# Patient Record
Sex: Female | Born: 1965 | Race: White | Hispanic: No | Smoking: Never smoker
Health system: Southern US, Community
[De-identification: ages and names within clinical notes are randomized; demographics above are authoritative.]

## PROBLEM LIST (undated history)

## (undated) DIAGNOSIS — K219 Gastro-esophageal reflux disease without esophagitis: Secondary | ICD-10-CM

## (undated) DIAGNOSIS — Z8719 Personal history of other diseases of the digestive system: Secondary | ICD-10-CM

## (undated) DIAGNOSIS — J45909 Unspecified asthma, uncomplicated: Secondary | ICD-10-CM

## (undated) DIAGNOSIS — Z972 Presence of dental prosthetic device (complete) (partial): Secondary | ICD-10-CM

## (undated) DIAGNOSIS — F419 Anxiety disorder, unspecified: Secondary | ICD-10-CM

## (undated) DIAGNOSIS — Z973 Presence of spectacles and contact lenses: Secondary | ICD-10-CM

## (undated) DIAGNOSIS — M1990 Unspecified osteoarthritis, unspecified site: Secondary | ICD-10-CM

## (undated) DIAGNOSIS — F411 Generalized anxiety disorder: Secondary | ICD-10-CM

## (undated) DIAGNOSIS — I10 Essential (primary) hypertension: Secondary | ICD-10-CM

## (undated) DIAGNOSIS — J189 Pneumonia, unspecified organism: Secondary | ICD-10-CM

## (undated) DIAGNOSIS — T7840XA Allergy, unspecified, initial encounter: Secondary | ICD-10-CM

## (undated) DIAGNOSIS — K449 Diaphragmatic hernia without obstruction or gangrene: Secondary | ICD-10-CM

## (undated) DIAGNOSIS — F32A Depression, unspecified: Secondary | ICD-10-CM

## (undated) HISTORY — DX: Gastro-esophageal reflux disease without esophagitis: K21.9

## (undated) HISTORY — PX: KIDNEY SURGERY: SHX687

## (undated) HISTORY — PX: FRACTURE SURGERY: SHX138

## (undated) HISTORY — DX: Unspecified asthma, uncomplicated: J45.909

## (undated) HISTORY — PX: ABDOMINAL HYSTERECTOMY: SHX81

## (undated) HISTORY — DX: Depression, unspecified: F32.A

## (undated) HISTORY — DX: Allergy, unspecified, initial encounter: T78.40XA

## (undated) HISTORY — DX: Anxiety disorder, unspecified: F41.9

## (undated) HISTORY — PX: TUBAL LIGATION: SHX77

## (undated) HISTORY — DX: Essential (primary) hypertension: I10

---

## 1996-10-03 HISTORY — PX: LAPAROSCOPIC TUBAL LIGATION: SUR803

## 2003-10-04 DIAGNOSIS — Z87448 Personal history of other diseases of urinary system: Secondary | ICD-10-CM

## 2003-10-04 HISTORY — DX: Personal history of other diseases of urinary system: Z87.448

## 2003-10-04 HISTORY — PX: CYSTOSCOPY WITH RETROGRADE PYELOGRAM, URETEROSCOPY AND STENT PLACEMENT: SHX5789

## 2004-07-15 ENCOUNTER — Ambulatory Visit (HOSPITAL_BASED_OUTPATIENT_CLINIC_OR_DEPARTMENT_OTHER): Admission: RE | Admit: 2004-07-15 | Discharge: 2004-07-15 | Payer: Self-pay | Admitting: Surgery

## 2004-07-26 ENCOUNTER — Ambulatory Visit (HOSPITAL_COMMUNITY): Admission: RE | Admit: 2004-07-26 | Discharge: 2004-07-26 | Payer: Self-pay | Admitting: Urology

## 2004-08-20 ENCOUNTER — Inpatient Hospital Stay (HOSPITAL_COMMUNITY): Admission: RE | Admit: 2004-08-20 | Discharge: 2004-08-23 | Payer: Self-pay | Admitting: Urology

## 2006-10-03 DIAGNOSIS — N189 Chronic kidney disease, unspecified: Secondary | ICD-10-CM

## 2006-10-03 HISTORY — DX: Chronic kidney disease, unspecified: N18.9

## 2008-10-03 HISTORY — PX: ORIF ANKLE FRACTURE: SUR919

## 2019-09-13 ENCOUNTER — Other Ambulatory Visit: Payer: Self-pay

## 2019-09-13 DIAGNOSIS — Z20822 Contact with and (suspected) exposure to covid-19: Secondary | ICD-10-CM

## 2019-09-14 LAB — NOVEL CORONAVIRUS, NAA: SARS-CoV-2, NAA: NOT DETECTED

## 2020-04-21 ENCOUNTER — Encounter: Payer: Self-pay | Admitting: Family Medicine

## 2020-04-21 ENCOUNTER — Other Ambulatory Visit: Payer: Self-pay

## 2020-04-21 ENCOUNTER — Ambulatory Visit (INDEPENDENT_AMBULATORY_CARE_PROVIDER_SITE_OTHER): Payer: Medicare Other | Admitting: Family Medicine

## 2020-04-21 ENCOUNTER — Other Ambulatory Visit (HOSPITAL_COMMUNITY)
Admission: RE | Admit: 2020-04-21 | Discharge: 2020-04-21 | Disposition: A | Discharge status: Home / Self Care | Payer: Medicare Other | Source: Ambulatory Visit | Attending: Family Medicine | Admitting: Family Medicine

## 2020-04-21 VITALS — BP 142/86 | HR 71 | Temp 97.8°F | Resp 18 | Ht 60.0 in | Wt 143.1 lb

## 2020-04-21 DIAGNOSIS — R300 Dysuria: Secondary | ICD-10-CM | POA: Insufficient documentation

## 2020-04-21 DIAGNOSIS — J4530 Mild persistent asthma, uncomplicated: Secondary | ICD-10-CM | POA: Insufficient documentation

## 2020-04-21 DIAGNOSIS — I10 Essential (primary) hypertension: Secondary | ICD-10-CM | POA: Insufficient documentation

## 2020-04-21 DIAGNOSIS — Z1231 Encounter for screening mammogram for malignant neoplasm of breast: Secondary | ICD-10-CM

## 2020-04-21 DIAGNOSIS — Z124 Encounter for screening for malignant neoplasm of cervix: Secondary | ICD-10-CM | POA: Insufficient documentation

## 2020-04-21 LAB — POCT URINALYSIS DIP (CLINITEK)
Bilirubin, UA: NEGATIVE
Glucose, UA: NEGATIVE mg/dL
Leukocytes, UA: NEGATIVE
Nitrite, UA: NEGATIVE
POC PROTEIN,UA: NEGATIVE
Spec Grav, UA: 1.01 (ref 1.010–1.025)
Urobilinogen, UA: 0.2 E.U./dL
pH, UA: 6.5 (ref 5.0–8.0)

## 2020-04-21 MED ORDER — BUDESONIDE-FORMOTEROL FUMARATE 80-4.5 MCG/ACT IN AERO: 1.0000 | INHALATION_SPRAY | Freq: Every day | RESPIRATORY_TRACT | 3 refills | Status: DC

## 2020-04-21 NOTE — Assessment & Plan Note (Signed)
[facility]

## 2020-04-21 NOTE — Assessment & Plan Note (Signed)
Mammogram ordered

## 2020-04-21 NOTE — Assessment & Plan Note (Signed)
Will start Symbicort to see if that will help with mucus production that she is experiencing. Close follow-up pulmonary referral as needed.  Reviewed side effects, risks and benefits of medication.   Patient acknowledged agreement and understanding of the plan.

## 2020-04-21 NOTE — Patient Instructions (Addendum)
I appreciate the opportunity to provide you with care for your health and wellness. Today we discussed: established care   Follow up: 4 weeks by phone for asthma and ringing of ears check in   Fasting labs when she can Referrals today for GYN (will need GI for colonoscopy at next appt) Orders: Nice :)  Start taking Claritin or Zyrtec daily. Use inhaler as discussed (rinse mouth post use)  Avoid salt to help BP (we will recheck at next visit)  Get compression sleeve for ankle to help with exercise tolerance (ice and elevate post activity)  Please continue to practice social distancing to keep you, your family, and our community safe.  If you must go out, please wear a mask and practice good handwashing.  It was a pleasure to see you and I look forward to continuing to work together on your health and well-being. Please do not hesitate to call the office if you need care or have questions about your care.  Have a wonderful day and week. With Gratitude, Cherly Beach, DNP, AGNP-BC

## 2020-04-21 NOTE — Assessment & Plan Note (Signed)
Katherine [name] is encouraged to maintain a well balanced diet that is low in salt. No currently taking medication for this. Additionally, she is also reminded that exercise is beneficial for heart health and control of  Blood pressure. 30-60 minutes daily is recommended-walking was suggested.

## 2020-04-21 NOTE — Assessment & Plan Note (Signed)
Urine dip in office was negative except for trace blood and some ketones.  We will send out for culture and sensitivity.  If needed will treat appropriately.  At this time encourage AZO, cranberry juice and limiting juice, and adequate if not more hydration than normal. Patient acknowledged agreement and understanding of the plan.

## 2020-04-21 NOTE — Progress Notes (Signed)
Subjective:  Patient ID: Katherine [name], female    DOB: [birth year]  Age: 54 y.o. MRN: [id number]  CC:  Chief Complaint  Patient presents with   New Patient (Initial Visit)    Former [facility] pt hasnt seen a primary since 2018 due to loss of insurance    Urinary Tract Infection    burning and frequent urination since thursday has been trying to drink plenty of water but no better       HPI  HPI  Katherine [name] is a 54 year old female patient who presents today to establish care to [facility].  Lives in [place] was a former patient of Katherine [name] but lost her insurance.  Presents here today because her daughter is also in the [facility] system.  She has not had labs in some time.  She is not up-to-date on her mammogram, has not had a colonoscopy, has not had a Pap stop cycles in 2013.  Only complaint today outside of establishing care is that she continuously has ringing in her ears for last 4 to 5 months.  She reports that it is worse at night.  She reports that especially after turning off the TV or there is been sound and become very quiet is very bothersome.  She is not taking anything for allergies.  She reports that she cleans them regularly with Q-tips.  Has not had any trouble hearing.  History is asthma, uses her rescue inhaler a few times a week.  Coughs up a lot of phlegm in the morning.  Has not been on Singulair or a steroid inhaler.  Reports that she feels like it is gotten worse over time.  Was diagnosed at 54 years old with asthma.  Blood pressure elevated today.  She reports that all of her family members have high blood pressure in addition to some type of thyroid issue.  She denies having any chest issues, cough or shortness of breath outside of described above, leg swelling outside of the right ankle when she broke it she has had problems at this fracture.  Specially when she is up on it uses a brace at times but she feels like it makes it worse.  Does not have any palpitations,  headaches, vision changes or dizziness.   Today patient denies signs and symptoms of COVID 19 infection including fever, chills, cough, shortness of breath, and headache. Past Medical, Surgical, Social History, Allergies, and Medications have been Reviewed.   Past Medical History:  Diagnosis Date   Asthma    Phreesia 04/18/2020   Depression    Phreesia 04/18/2020    No outpatient medications have been marked as taking for the 04/21/20 encounter (Office Visit) with Perlie Mayo, NP.    ROS:  Review of Systems  Constitutional: Negative.   HENT: Negative.   Eyes: Negative.   Respiratory: Negative.   Cardiovascular: Negative.   Gastrointestinal: Negative.   Genitourinary: Positive for dysuria, frequency and urgency.  Musculoskeletal: Negative.   Skin: Negative.   Neurological: Negative.   Endo/Heme/Allergies: Negative.   Psychiatric/Behavioral: Negative.   All other systems reviewed and are negative.    Objective:   Today's Vitals: BP (!) 142/86 (BP Location: Right Arm, Patient Position: Sitting, Cuff Size: Normal)    Pulse 71    Temp 97.8 F (36.6 C) (Temporal)    Resp 18    Ht 5' (1.524 m)    Wt 143 lb 1.9 oz (64.9 kg)    SpO2 97%  BMI 27.95 kg/m  Vitals with BMI 04/21/2020  Height 5' 0"   Weight 143 lbs 2 oz  BMI 03.54  Systolic 656  Diastolic 86  Pulse 71     Physical Exam Vitals and nursing note reviewed.  Constitutional:      Appearance: Normal appearance. She is well-developed, well-groomed and overweight.  HENT:     Head: Normocephalic and atraumatic.     Right Ear: External ear normal.     Left Ear: External ear normal.     Mouth/Throat:     Comments: Mask in place  Eyes:     General:        Right eye: No discharge.        Left eye: No discharge.     Conjunctiva/sclera: Conjunctivae normal.  Cardiovascular:     Rate and Rhythm: Normal rate and regular rhythm.     Pulses: Normal pulses.     Heart sounds: Normal heart sounds.  Pulmonary:      Effort: Pulmonary effort is normal.     Breath sounds: Normal breath sounds.  Musculoskeletal:        General: Normal range of motion.     Cervical back: Normal range of motion and neck supple.  Skin:    General: Skin is warm.  Neurological:     General: No focal deficit present.     Mental Status: She is alert and oriented to person, place, and time.  Psychiatric:        Attention and Perception: Attention normal.        Mood and Affect: Mood normal.        Speech: Speech normal.        Behavior: Behavior normal. Behavior is cooperative.        Thought Content: Thought content normal.        Cognition and Memory: Cognition normal.        Judgment: Judgment normal.     Comments: Pleasant on conversation  Good eye contact      Assessment   1. Mild persistent asthma, unspecified whether complicated   2. Essential hypertension   3. Dysuria   4. Encounter for screening mammogram for malignant neoplasm of breast   5. Encounter for screening for malignant neoplasm of cervix     Tests ordered Orders Placed This Encounter  Procedures   Urine Culture   MM 3D SCREEN BREAST BILATERAL   TSH   CBC   COMPLETE METABOLIC PANEL WITH GFR   Lipid panel   CMP14+EGFR   Ambulatory referral to Obstetrics / Gynecology   POCT URINALYSIS DIP (CLINITEK)     Plan: Please see assessment and plan per problem list above.   Meds ordered this encounter  Medications   budesonide-formoterol (SYMBICORT) 80-4.5 MCG/ACT inhaler    Sig: Inhale 1 puff into the lungs daily.    Dispense:  1 Inhaler    Refill:  3    Order Specific Question:   Supervising Provider    Answer:   Fayrene Helper [[id number]]    Patient to follow-up in 3 months   Perlie Mayo, NP

## 2020-04-22 LAB — URINE CULTURE: Culture: <10000 — AB

## 2020-05-08 ENCOUNTER — Inpatient Hospital Stay: Admission: RE | Admit: 2020-05-08 | Payer: Medicare Other | Source: Ambulatory Visit

## 2020-05-19 ENCOUNTER — Telehealth: Payer: Medicare Other | Admitting: Family Medicine

## 2020-05-19 ENCOUNTER — Other Ambulatory Visit: Payer: Self-pay

## 2020-10-01 ENCOUNTER — Other Ambulatory Visit: Payer: Self-pay

## 2020-10-01 ENCOUNTER — Ambulatory Visit
Admission: EM | Admit: 2020-10-01 | Discharge: 2020-10-01 | Disposition: A | Discharge status: Home / Self Care | Payer: Medicare Other | Attending: Family Medicine | Admitting: Family Medicine

## 2020-10-01 ENCOUNTER — Ambulatory Visit (INDEPENDENT_AMBULATORY_CARE_PROVIDER_SITE_OTHER): Payer: Medicare Other

## 2020-10-01 DIAGNOSIS — R059 Cough, unspecified: Secondary | ICD-10-CM

## 2020-10-01 DIAGNOSIS — J189 Pneumonia, unspecified organism: Secondary | ICD-10-CM | POA: Diagnosis not present

## 2020-10-01 DIAGNOSIS — R0602 Shortness of breath: Secondary | ICD-10-CM

## 2020-10-01 DIAGNOSIS — R5383 Other fatigue: Secondary | ICD-10-CM | POA: Diagnosis not present

## 2020-10-01 DIAGNOSIS — R519 Headache, unspecified: Secondary | ICD-10-CM | POA: Diagnosis not present

## 2020-10-01 DIAGNOSIS — R0981 Nasal congestion: Secondary | ICD-10-CM

## 2020-10-01 DIAGNOSIS — R52 Pain, unspecified: Secondary | ICD-10-CM | POA: Diagnosis not present

## 2020-10-01 DIAGNOSIS — R630 Anorexia: Secondary | ICD-10-CM

## 2020-10-01 MED ORDER — PREDNISONE 10 MG (21) PO TBPK
ORAL_TABLET | Freq: Every day | ORAL | 0 refills | Status: AC
Start: 2020-10-01 — End: 2020-10-07

## 2020-10-01 MED ORDER — DOXYCYCLINE HYCLATE 100 MG PO CAPS: 100.0000 mg | ORAL_CAPSULE | Freq: Two times a day (BID) | ORAL | 0 refills | Status: DC

## 2020-10-01 MED ORDER — METHYLPREDNISOLONE SODIUM SUCC 125 MG IJ SOLR: 125.0000 mg | Freq: Once | INTRAMUSCULAR | Status: DC

## 2020-10-01 MED ORDER — AZITHROMYCIN 250 MG PO TABS: 250.0000 mg | ORAL_TABLET | Freq: Every day | ORAL | 0 refills | Status: DC

## 2020-10-01 MED ORDER — PROMETHAZINE-DM 6.25-15 MG/5ML PO SYRP: 5.0000 mL | ORAL_SOLUTION | Freq: Four times a day (QID) | ORAL | 0 refills | Status: DC | PRN

## 2020-10-01 MED ORDER — DOXYCYCLINE HYCLATE 100 MG PO CAPS: 100.0000 mg | ORAL_CAPSULE | Freq: Two times a day (BID) | ORAL | 0 refills | Status: AC

## 2020-10-01 NOTE — Discharge Instructions (Addendum)
Your xray is positive for pneumonia to the right lower lung  I have sent in a prednisone taper for you to take for 6 days. 6 tablets on day one, 5 tablets on day two, 4 tablets on day three, 3 tablets on day four, 2 tablets on day five, and 1 tablet on day six.  I have sent in doxycycline for you to take twice a day for 10 days  I have sent in azithromycin for you to take. Take 2 tablets today, then one tablet daily for the next 4 days.  I have sent in cough syrup for you to take. This medication can make you sleepy. Do not drive while taking this medication.  You have received a steroid injection in the office today.  Your COVID and Flu tests are pending.  You should self quarantine until the test results are back.    Take Tylenol or ibuprofen as needed for fever or discomfort.  Rest and keep yourself hydrated.    Follow-up with your primary care provider if your symptoms are not improving.

## 2020-10-01 NOTE — ED Triage Notes (Signed)
x1 week. Fatigue, body aches, cough, headache, loss of appetite. Pt states that she is short of breath with some chest congestion. Pt states that she also has some ringing of the ears. Pt states that she is not vaccinated.

## 2020-10-03 LAB — NOVEL CORONAVIRUS, NAA: SARS-CoV-2, NAA: DETECTED — AB

## 2020-10-03 LAB — SARS-COV-2, NAA 2 DAY TAT

## 2020-10-04 NOTE — ED Provider Notes (Signed)
[facility]   [id number] 10/01/20 Arrival Time: [id number]   CC: COVID symptoms  SUBJECTIVE: History from: patient.  Katherine [name] is a 55 y.o. female who presents with abrupt onset of fatigue, body aches, cough, headache, loss of appetite, SOB, tinnitus for the last week. Denies sick exposure to COVID, flu or strep. Denies recent travel. Has negative history of Covid. Has not completed Covid vaccines. Has not taken OTC medications for this. SOB and cough are aggravated with activity. Denies previous symptoms in the past. Denies fever, sinus pain, rhinorrhea, sore throat, wheezing, chest pain, nausea, changes in bowel or bladder habits.    ROS: As per HPI.  All other pertinent ROS negative.     Past Medical History:  Diagnosis Date  . Asthma    Phreesia 04/18/2020  . Depression    Phreesia 04/18/2020   Past Surgical History:  Procedure Laterality Date  . CESAREAN SECTION N/A    Phreesia 04/18/2020  . FRACTURE SURGERY N/A    Phreesia 04/18/2020  . TUBAL LIGATION N/A    Phreesia 04/18/2020   No Known Allergies No current facility-administered medications on file prior to encounter.   Current Outpatient Medications on File Prior to Encounter  Medication Sig Dispense Refill  . albuterol (VENTOLIN HFA) 108 (90 Base) MCG/ACT inhaler Inhale into the lungs.    . budesonide-formoterol (SYMBICORT) 80-4.5 MCG/ACT inhaler Inhale 1 puff into the lungs daily. 1 Inhaler 3   Social History   Socioeconomic History  . Marital status: Divorced    Spouse name: Not on file  . Number of children: 4  . Years of education: Not on file  . Highest education level: 10th grade  Occupational History  . Not on file  Tobacco Use  . Smoking status: Never Smoker  . Smokeless tobacco: Never Used  Substance and Sexual Activity  . Alcohol use: Never  . Drug use: Never  . Sexual activity: Yes    Partners: Male    Birth control/protection: Surgical  Other Topics Concern  . Not on file   Social History Narrative   Lives alone   4 children-one in [place], [place], [place], and [place] in the [place] children 2      Enjoy: spending time with family, children visit often       Diets: eats all food groups    Caffeine: 1 cup coffee twice week   Water: 6-8 cups      Wears seat belt    Does not use phone while driving-handfree   Smoke detectors at home   No weapons in the house    Social Determinants of Health   Financial Resource Strain: [place]   . Difficulty of Paying Living Expenses: Not hard at all  Food Insecurity: No Food Insecurity  . Worried About Charity fundraiser in the Last Year: Never true  . Ran Out of Food in the Last Year: Never true  Transportation Needs: No Transportation Needs  . Lack of Transportation (Medical): No  . Lack of Transportation (Non-Medical): No  Physical Activity: Insufficiently Active  . Days of Exercise per Week: 3 days  . Minutes of Exercise per Session: 30 min  Stress: No Stress Concern Present  . Feeling of Stress : Only a little  Social Connections: Socially Isolated  . Frequency of Communication with Friends and Family: More than three times a week  . Frequency of Social Gatherings with Friends and Family: More than three times a week  .  Attends Religious Services: Never  . Active Member of Clubs or Organizations: No  . Attends Banker Meetings: Never  . Marital Status: Divorced  Catering manager Violence: Not At Risk  . Fear of Current or Ex-Partner: No  . Emotionally Abused: No  . Physically Abused: No  . Sexually Abused: No   Family History  Problem Relation Age of Onset  . Hypothyroidism Mother   . Hypertension Mother   . Hypertension Father   . Stroke Father   . Hypothyroidism Sister     OBJECTIVE:  Vitals:   10/01/20 1403 10/01/20 1404  BP:  133/85  Pulse:  75  Temp:  98.6 F (37 C)  TempSrc:  Oral  SpO2:  98%  Weight: 140 lb (63.5 kg)   Height: 5' (1.524 m)       General appearance: alert; appears fatigued, but nontoxic; speaking in full sentences and tolerating own secretions HEENT: NCAT; Ears: EACs clear, TMs pearly gray; Eyes: PERRL.  EOM grossly intact. Sinuses: nontender; Nose: nares patent without rhinorrhea, Throat: oropharynx erythematous, cobblestoning present, tonsils non erythematous or enlarged, uvula midline  Neck: supple with LAD Lungs: unlabored respirations, symmetrical air entry; cough: moderate; no respiratory distress; coarse lung sounds to R lung, wheezing to bilateral lung fields Heart: regular rate and rhythm.  Radial pulses 2+ symmetrical bilaterally Skin: warm and dry Psychological: alert and cooperative; normal mood and affect  LABS:  No results found for this or any previous visit (from the past 24 hour(s)).   ASSESSMENT & PLAN:  1. Pneumonia of right lower lobe due to infectious organism   2. Acute intractable headache, unspecified headache type   3. Body aches   4. Other fatigue   5. Nasal congestion   6. Cough   7. Loss of appetite     Meds ordered this encounter  Medications  . DISCONTD: doxycycline (VIBRAMYCIN) 100 MG capsule    Sig: Take 1 capsule (100 mg total) by mouth 2 (two) times daily.    Dispense:  14 capsule    Refill:  0    Order Specific Question:   Supervising Provider    Answer:   Merrilee Jansky [id number]  . azithromycin (ZITHROMAX) 250 MG tablet    Sig: Take 1 tablet (250 mg total) by mouth daily. Take first 2 tablets together, then 1 every day until finished.    Dispense:  6 tablet    Refill:  0    Order Specific Question:   Supervising Provider    Answer:   Merrilee Jansky [id number]  . promethazine-dextromethorphan (PROMETHAZINE-DM) 6.25-15 MG/5ML syrup    Sig: Take 5 mLs by mouth 4 (four) times daily as needed for cough.    Dispense:  118 mL    Refill:  0    Order Specific Question:   Supervising Provider    Answer:   Merrilee Jansky [id number]  . predniSONE (STERAPRED  UNI-PAK 21 TAB) 10 MG (21) TBPK tablet    Sig: Take by mouth daily for 6 days. Take 6 tablets on day 1, 5 tablets on day 2, 4 tablets on day 3, 3 tablets on day 4, 2 tablets on day 5, 1 tablet on day 6    Dispense:  21 tablet    Refill:  0    Order Specific Question:   Supervising Provider    Answer:   Merrilee Jansky [id number]  . doxycycline (VIBRAMYCIN) 100 MG capsule    Sig: Take 1 capsule (100  mg total) by mouth 2 (two) times daily for 10 days.    Dispense:  20 capsule    Refill:  0    Order Specific Question:   Supervising Provider    Answer:   Chase Picket [id number]  . DISCONTD: methylPREDNISolone sodium succinate (SOLU-MEDROL) 125 mg/2 mL injection 125 mg   Chest xray shows pneumonia in R lower lung Prescribed azithromycin Prescribed doxycycline Prescribed steroid taper Take as directed and to completion Prescribed promethazine cough syrup Sedation precautions given Recommend follow up chest xray in 4-6 weeks to ensure pneumonia resolution Continue supportive care at home COVID and flu testing ordered.  It will take between 1-2 days for test results.  Someone will contact you regarding abnormal results.   Work note provided Patient should remain in quarantine until they have received Covid results.  If negative you may resume normal activities (go back to work/school) while practicing hand hygiene, social distance, and mask wearing.  If positive, patient should remain in quarantine for 10 days from symptom onset AND greater than 72 hours after symptoms resolution (absence of fever without the use of fever-reducing medication and improvement in respiratory symptoms), whichever is longer Get plenty of rest and push fluids Use medications daily for symptom relief Use OTC medications like ibuprofen or tylenol as needed fever or pain Call or go to the ED if you have any new or worsening symptoms such as fever, worsening cough, shortness of breath, chest tightness, chest pain,  turning blue, changes in mental status.  Reviewed expectations re: course of current medical issues. Questions answered. Outlined signs and symptoms indicating need for more acute intervention. Patient verbalized understanding. After Visit Summary given.         Faustino Congress, NP 10/04/20 1104

## 2021-03-29 ENCOUNTER — Telehealth: Payer: Medicare Other | Admitting: Physician Assistant

## 2021-03-29 ENCOUNTER — Encounter: Payer: Self-pay | Admitting: Physician Assistant

## 2021-03-29 DIAGNOSIS — R3989 Other symptoms and signs involving the genitourinary system: Secondary | ICD-10-CM | POA: Diagnosis not present

## 2021-03-29 MED ORDER — CEPHALEXIN 500 MG PO CAPS: 500.0000 mg | ORAL_CAPSULE | Freq: Two times a day (BID) | ORAL | 0 refills | Status: DC

## 2021-03-29 NOTE — Patient Instructions (Signed)
Katherine [name], thank you for joining Mar Daring, PA-C for today's virtual visit.  While this provider is not your primary care provider (PCP), if your PCP is located in our provider database this encounter information will be shared with them immediately following your visit.  Consent: (Patient) Katherine [name] provided verbal consent for this virtual visit at the beginning of the encounter.  Current Medications:  Current Outpatient Medications:    cephALEXin (KEFLEX) 500 MG capsule, Take 1 capsule (500 mg total) by mouth 2 (two) times daily., Disp: 14 capsule, Rfl: 0   albuterol (VENTOLIN HFA) 108 (90 Base) MCG/ACT inhaler, Inhale into the lungs., Disp: , Rfl:    azithromycin (ZITHROMAX) 250 MG tablet, Take 1 tablet (250 mg total) by mouth daily. Take first 2 tablets together, then 1 every day until finished., Disp: 6 tablet, Rfl: 0   budesonide-formoterol (SYMBICORT) 80-4.5 MCG/ACT inhaler, Inhale 1 puff into the lungs daily., Disp: 1 Inhaler, Rfl: 3   promethazine-dextromethorphan (PROMETHAZINE-DM) 6.25-15 MG/5ML syrup, Take 5 mLs by mouth 4 (four) times daily as needed for cough., Disp: 118 mL, Rfl: 0   Medications ordered in this encounter:  Meds ordered this encounter  Medications   cephALEXin (KEFLEX) 500 MG capsule    Sig: Take 1 capsule (500 mg total) by mouth 2 (two) times daily.    Dispense:  14 capsule    Refill:  0    Order Specific Question:   Supervising Provider    Answer:   Sabra Heck, [place]     *If you need refills on other medications prior to your next appointment, please contact your pharmacy*  Follow-Up: Call back or seek an in-person evaluation if the symptoms worsen or if the condition fails to improve as anticipated.  If you have been instructed to have an in-person evaluation today at a local Urgent Care facility, please use the link below. It will take you to a list of all of our available [facility], including address, phone number and  hours of operation. Please do not delay care.  [facility]  If you or a family member do not have a primary care provider, use the link below to schedule a visit and establish care. When you choose a [facility] primary care physician or advanced practice provider, you gain a long-term partner in health. Find a Primary Care Provider  Learn more about [facility]'s in-office and virtual care options: [facility]   Urinary Tract Infection, Adult A urinary tract infection (UTI) is an infection of any part of the urinary tract. The urinary tract includes: The kidneys. The ureters. The bladder. The urethra. These organs make, store, and get rid of pee (urine) in the body. What are the causes? This infection is caused by germs (bacteria) in your genital area. These germs grow and cause swelling (inflammation) of your urinary tract. What increases the risk? The following factors may make you more likely to develop this condition: Using a small, thin tube (catheter) to drain pee. Not being able to control when you pee or poop (incontinence). Being female. If you are female, these things can increase the risk: Using these methods to prevent pregnancy: A medicine that kills sperm (spermicide). A device that blocks sperm (diaphragm). Having low levels of a female hormone (estrogen). Being pregnant. You are more likely to develop this condition if: You have genes that add to your risk. You are sexually active. You take antibiotic medicines. You  have trouble peeing because of: A prostate that is bigger than normal, if you are female. A blockage in the part of your body that drains pee from the bladder. A kidney stone. A nerve condition that affects your bladder. Not getting enough to drink. Not peeing often enough. You have other conditions, such as: Diabetes. A weak disease-fighting system (immune system). Sickle cell disease. Gout. Injury of the  spine. What are the signs or symptoms? Symptoms of this condition include: Needing to pee right away. Peeing small amounts often. Pain or burning when peeing. Blood in the pee. Pee that smells bad or not like normal. Trouble peeing. Pee that is cloudy. Fluid coming from the vagina, if you are female. Pain in the belly or lower back. Other symptoms include: Vomiting. Not feeling hungry. Feeling mixed up (confused). This may be the first symptom in older adults. Being tired and grouchy (irritable). A fever. Watery poop (diarrhea). How is this treated? Taking antibiotic medicine. Taking other medicines. Drinking enough water. In some cases, you may need to see a specialist. Follow these instructions at home:  Medicines Take over-the-counter and prescription medicines only as told by your doctor. If you were prescribed an antibiotic medicine, take it as told by your doctor. Do not stop taking it even if you start to feel better. General instructions Make sure you: Pee until your bladder is empty. Do not hold pee for a long time. Empty your bladder after sex. Wipe from front to back after peeing or pooping if you are a female. Use each tissue one time when you wipe. Drink enough fluid to keep your pee pale yellow. Keep all follow-up visits. Contact a doctor if: You do not get better after 1-2 days. Your symptoms go away and then come back. Get help right away if: You have very bad back pain. You have very bad pain in your lower belly. You have a fever. You have chills. You feeling like you will vomit or you vomit. Summary A urinary tract infection (UTI) is an infection of any part of the urinary tract. This condition is caused by germs in your genital area. There are many risk factors for a UTI. Treatment includes antibiotic medicines. Drink enough fluid to keep your pee pale yellow. This information is not intended to replace advice given to you by your health care  provider. Make sure you discuss any questions you have with your healthcare provider. Document Revised: 05/01/2020 Document Reviewed: 05/01/2020 Elsevier Patient Education  [employer].

## 2021-03-29 NOTE — Progress Notes (Signed)
Ms. Katherine, [name] are scheduled for a virtual visit with your provider today.    Just as we do with appointments in the office, we must obtain your consent to participate.  Your consent will be active for this visit and any virtual visit you may have with one of our providers in the next 365 days.    If you have a MyChart account, I can also send a copy of this consent to you electronically.  All virtual visits are billed to your insurance company just like a traditional visit in the office.  As this is a virtual visit, video technology does not allow for your provider to perform a traditional examination.  This may limit your provider's ability to fully assess your condition.  If your provider identifies any concerns that need to be evaluated in person or the need to arrange testing such as labs, EKG, etc, we will make arrangements to do so.    Although advances in technology are sophisticated, we cannot ensure that it will always work on either your end or our end.  If the connection with a video visit is poor, we may have to switch to a telephone visit.  With either a video or telephone visit, we are not always able to ensure that we have a secure connection.   I need to obtain your verbal consent now.   Are you willing to proceed with your visit today?   Katherine [name] has provided verbal consent on 03/29/2021 for a virtual visit (video or telephone).   Katherine [name], Katherine [name] 03/29/2021  9:48 AM  Virtual Visit Consent   Katherine [name], you are scheduled for a virtual visit with a [facility] provider today.     Just as with appointments in the office, your consent must be obtained to participate.  Your consent will be active for this visit and any virtual visit you may have with one of our providers in the next 365 days.     If you have a MyChart account, a copy of this consent can be sent to you electronically.  All virtual visits are billed to your insurance company just like a traditional visit in  the office.    As this is a virtual visit, video technology does not allow for your provider to perform a traditional examination.  This may limit your provider's ability to fully assess your condition.  If your provider identifies any concerns that need to be evaluated in person or the need to arrange testing (such as labs, EKG, etc.), we will make arrangements to do so.     Although advances in technology are sophisticated, we cannot ensure that it will always work on either your end or our end.  If the connection with a video visit is poor, the visit may have to be switched to a telephone visit.  With either a video or telephone visit, we are not always able to ensure that we have a secure connection.     I need to obtain your verbal consent now.   Are you willing to proceed with your visit today?    Katherine [name] has provided verbal consent on 03/29/2021 for a virtual visit (video or telephone).   Katherine [name], Katherine [name]   Date: 03/29/2021 9:48 AM   Virtual Visit via Video Note   [name], connected [email or web address] ([id number], 16-Oct-1965) on 03/29/21 at  9:45 AM EDT by a video-enabled telemedicine application and verified that I am speaking with the  correct person using two identifiers.  Location: Patient: Virtual Visit Location Patient: Home Provider: Virtual Visit Location Provider: Home Office   I discussed the limitations of evaluation and management by telemedicine and the availability of in person appointments. The patient expressed understanding and agreed to proceed.    History of Present Illness: Katherine [name] is a 55 y.o. who identifies as a female who was assigned female at birth, and is being seen today for UTI symptoms.  HPI: Urinary Tract Infection  This is a new problem. The current episode started in the past 7 days (symptoms started Thursday). The problem has been unchanged. The quality of the pain is described as aching. The pain is mild. There has been no  fever. She is Sexually active. There is No history of pyelonephritis. Associated symptoms include chills, flank pain (on left), frequency, nausea (saturday), sweats and urgency. Pertinent negatives include no discharge, hematuria, hesitancy, possible pregnancy or vomiting. She has tried increased fluids and acetaminophen (AZO) for the symptoms. The treatment provided no relief. Her past medical history is significant for recurrent UTIs.     Problems:  Patient Active Problem List   Diagnosis Date Noted   Essential hypertension 04/21/2020   Dysuria 04/21/2020   Mild persistent asthma 04/21/2020   Encounter for screening mammogram for malignant neoplasm of breast 04/21/2020   Encounter for screening for malignant neoplasm of cervix 04/21/2020    Allergies: No Known Allergies Medications:  Current Outpatient Medications:    cephALEXin (KEFLEX) 500 MG capsule, Take 1 capsule (500 mg total) by mouth 2 (two) times daily., Disp: 14 capsule, Rfl: 0   albuterol (VENTOLIN HFA) 108 (90 Base) MCG/ACT inhaler, Inhale into the lungs., Disp: , Rfl:    azithromycin (ZITHROMAX) 250 MG tablet, Take 1 tablet (250 mg total) by mouth daily. Take first 2 tablets together, then 1 every day until finished., Disp: 6 tablet, Rfl: 0   budesonide-formoterol (SYMBICORT) 80-4.5 MCG/ACT inhaler, Inhale 1 puff into the lungs daily., Disp: 1 Inhaler, Rfl: 3   promethazine-dextromethorphan (PROMETHAZINE-DM) 6.25-15 MG/5ML syrup, Take 5 mLs by mouth 4 (four) times daily as needed for cough., Disp: 118 mL, Rfl: 0  Observations/Objective: Patient is well-developed, well-nourished in no acute distress.  Resting comfortably at home.  Head is normocephalic, atraumatic.  No labored breathing.  Speech is clear and coherent with logical content.  Patient is alert and oriented at baseline.    Assessment and Plan: 1. Suspected UTI - cephALEXin (KEFLEX) 500 MG capsule; Take 1 capsule (500 mg total) by mouth 2 (two) times daily.   Dispense: 14 capsule; Refill: 0 -Worsening symptoms.  - Will treat empirically with Keflex.  - May continue AZO for spasm.  - Continue to push fluids.  - Seek in person evaluation if symptoms do not improve or if they worsen.   Follow Up Instructions: I discussed the assessment and treatment plan with the patient. The patient was provided an opportunity to ask questions and all were answered. The patient agreed with the plan and demonstrated an understanding of the instructions.  A copy of instructions were sent to the patient via MyChart.  The patient was advised to call back or seek an in-person evaluation if the symptoms worsen or if the condition fails to improve as anticipated.  Time:  I spent 11 minutes with the patient via telehealth technology discussing the above problems/concerns.    Katherine [name], Katherine [name]

## 2021-03-30 ENCOUNTER — Telehealth: Payer: Self-pay | Admitting: Family Medicine

## 2021-03-30 NOTE — Telephone Encounter (Signed)
Tried calling patient to schedule Medicare Annual Wellness Visit (AWV) either virtually or in office.  No answer  AWVI 01/01/21 per [place] please schedule at anytime with [facility]  health coach  This should be a 40 minute visit.

## 2021-04-21 ENCOUNTER — Ambulatory Visit (INDEPENDENT_AMBULATORY_CARE_PROVIDER_SITE_OTHER): Payer: Medicare Other | Admitting: *Deleted

## 2021-04-21 ENCOUNTER — Other Ambulatory Visit: Payer: Self-pay

## 2021-04-21 ENCOUNTER — Telehealth: Payer: Self-pay | Admitting: *Deleted

## 2021-04-21 DIAGNOSIS — Z0000 Encounter for general adult medical examination without abnormal findings: Secondary | ICD-10-CM

## 2021-04-21 NOTE — Progress Notes (Signed)
Subjective:   Katherine [name] is a 55 y.o. female who presents for Medicare Annual (Subsequent) preventive examination.  Review of Systems           Objective:    There were no vitals filed for this visit. There is no height or weight on file to calculate BMI.  No flowsheet data found.  Current Medications (verified) Outpatient Encounter Medications as of 04/21/2021  Medication Sig   albuterol (VENTOLIN HFA) 108 (90 Base) MCG/ACT inhaler Inhale into the lungs.   azithromycin (ZITHROMAX) 250 MG tablet Take 1 tablet (250 mg total) by mouth daily. Take first 2 tablets together, then 1 every day until finished.   budesonide-formoterol (SYMBICORT) 80-4.5 MCG/ACT inhaler Inhale 1 puff into the lungs daily.   cephALEXin (KEFLEX) 500 MG capsule Take 1 capsule (500 mg total) by mouth 2 (two) times daily.   promethazine-dextromethorphan (PROMETHAZINE-DM) 6.25-15 MG/5ML syrup Take 5 mLs by mouth 4 (four) times daily as needed for cough.   No facility-administered encounter medications on file as of 04/21/2021.    Allergies (verified) Patient has no known allergies.   History: Past Medical History:  Diagnosis Date   Asthma    Phreesia 04/18/2020   Depression    Phreesia 04/18/2020   Past Surgical History:  Procedure Laterality Date   CESAREAN SECTION N/A    Phreesia 04/18/2020   FRACTURE SURGERY N/A    Phreesia 04/18/2020   TUBAL LIGATION N/A    Phreesia 04/18/2020   Family History  Problem Relation Age of Onset   Hypothyroidism Mother    Hypertension Mother    Hypertension Father    Stroke Father    Hypothyroidism Sister    Social History   Socioeconomic History   Marital status: Divorced    Spouse name: Not on file   Number of children: 4   Years of education: Not on file   Highest education level: 10th grade  Occupational History   Not on file  Tobacco Use   Smoking status: Never   Smokeless tobacco: Never  Substance and Sexual Activity   Alcohol use: Never    Drug use: Never   Sexual activity: Yes    Partners: Male    Birth control/protection: Surgical  Other Topics Concern   Not on file  Social History Narrative   Lives alone   4 children-one in [place], [place], [place], and [place] in the [place] children 2      Enjoy: spending time with family, children visit often       Diets: eats all food groups    Caffeine: 1 cup coffee twice week   Water: 6-8 cups      Wears seat belt    Does not use phone while driving-handfree   Smoke detectors at home   No weapons in the house    Social Determinants of Health   Financial Resource Strain: Low Risk    Difficulty of Paying Living Expenses: Not hard at all  Food Insecurity: No Food Insecurity   Worried About Charity fundraiser in the Last Year: Never true   Arboriculturist in the Last Year: Never true  Transportation Needs: No Transportation Needs   Lack of Transportation (Medical): No   Lack of Transportation (Non-Medical): No  Physical Activity: Insufficiently Active   Days of Exercise per Week: 3 days   Minutes of Exercise per Session: 30 min  Stress: No Stress Concern Present   Feeling of Stress :  Only a little  Social Connections: Socially Isolated   Frequency of Communication with Friends and Family: More than three times a week   Frequency of Social Gatherings with Friends and Family: More than three times a week   Attends Religious Services: Never   Marine scientist or Organizations: No   Attends Archivist Meetings: Never   Marital Status: Divorced    Tobacco Counseling Counseling given: Not Answered   Clinical Intake:                 Diabetic?no         Activities of Daily Living In your present state of health, do you have any difficulty performing the following activities: 04/21/2020  Hearing? N  Vision? N  Difficulty concentrating or making decisions? N  Walking or climbing stairs? N  Dressing or bathing? N  Doing  errands, shopping? N  Some recent data might be hidden    Patient Care Team: Noreene Larsson, NP as PCP - General (Nurse Practitioner)  Indicate any recent Medical Services you may have received from other than [facility] providers in the past year (date may be approximate).     Assessment:   This is a routine wellness examination for Katherine [name].  Hearing/Vision screen No results found.  Dietary issues and exercise activities discussed:     Goals Addressed   None   Depression Screen PHQ 2/9 Scores 04/21/2020  PHQ - 2 Score 0  PHQ- 9 Score 0    Fall Risk Fall Risk  04/21/2020  Falls in the past year? 0  Number falls in past yr: 0  Injury with Fall? 0  Risk for fall due to : No Fall Risks  Follow up Falls evaluation completed    [place]:  Any stairs in or around the home? Yes  If so, are there any without handrails? Yes  Home free of loose throw rugs in walkways, pet beds, electrical cords, etc? Yes  Adequate lighting in your home to reduce risk of falls? Yes   ASSISTIVE DEVICES UTILIZED TO PREVENT FALLS:  Life alert? No  Use of a cane, walker or w/c? Yes  Grab bars in the bathroom? Yes  Shower chair or bench in shower? No  Elevated toilet seat or a handicapped toilet? No   TIMED UP AND GO:  Was the test performed? No .  Length of time to ambulate 10 feet: NA sec.     Cognitive Function:        Immunizations  There is no immunization history on file for this patient.  TDAP status: Up to date 2015  Flu Vaccine status: Up to date  Pneumococcal vaccine status: Up to date  Covid-19 vaccine status: Completed vaccines  Qualifies for Shingles Vaccine? Yes   Zostavax completed No   Shingrix Completed?: No.    Education has been provided regarding the importance of this vaccine. Patient has been advised to call insurance company to determine out of pocket expense if they have not yet received this vaccine. Advised may also  receive vaccine at local pharmacy or Health Dept. Verbalized acceptance and understanding.  Screening Tests Health Maintenance  Topic Date Due   HIV Screening  Never done   Hepatitis C Screening  Never done   TETANUS/TDAP  Never done   PAP SMEAR-Modifier  Never done   COLONOSCOPY (Pts 45-86yrs Insurance coverage will need to be confirmed)  Never done   MAMMOGRAM  Never  done   Zoster Vaccines- Shingrix (1 of 2) Never done   INFLUENZA VACCINE  05/03/2021   Pneumococcal Vaccine 22-57 Years old  Aged Out   HPV VACCINES  Aged Out    Health Maintenance  Health Maintenance Due  Topic Date Due   HIV Screening  Never done   Hepatitis C Screening  Never done   TETANUS/TDAP  Never done   PAP SMEAR-Modifier  Never done   COLONOSCOPY (Pts 45-28yrs Insurance coverage will need to be confirmed)  Never done   MAMMOGRAM  Never done   Zoster Vaccines- Shingrix (1 of 2) Never done    Colorectal cancer screening: Type of screening: Colonoscopy. Completed 2019. Repeat every 10 years  Mammogram status: Ordered Already placed pt will schedule . Pt provided with contact info and advised to call to schedule appt.     Lung Cancer Screening: (Low Dose CT Chest recommended if Age 33-80 years, 30 pack-year currently smoking OR have quit w/in 15years.) does not qualify.   Lung Cancer Screening Referral: NA  Additional Screening:  Hepatitis C Screening: does qualify;will have drawn next time in the office   Vision Screening: Recommended annual ophthalmology exams for early detection of glaucoma and other disorders of the eye. Is the patient up to date with their annual eye exam?  Yes  Who is the provider or what is the name of the office in which the patient attends annual eye exams? [facility] If pt is not established with a provider, would they like to be referred to a provider to establish care? No .   Dental Screening: Recommended annual dental exams for proper oral hygiene  Community Resource  Referral / Chronic Care Management: CRR required this visit?  No   CCM required this visit?  No      Plan:     I have personally reviewed and noted the following in the patient's chart:   Medical and social history Use of alcohol, tobacco or illicit drugs  Current medications and supplements including opioid prescriptions.  Functional ability and status Nutritional status Physical activity Advanced directives List of other physicians Hospitalizations, surgeries, and ER visits in previous 12 months Vitals Screenings to include cognitive, depression, and falls Referrals and appointments  In addition, I have reviewed and discussed with patient certain preventive protocols, quality metrics, and best practice recommendations. A written personalized care plan for preventive services as well as general preventive health recommendations were provided to patient.     Shelda Altes, CMA   04/21/2021   Nurse Notes:

## 2021-04-21 NOTE — Patient Instructions (Signed)
Katherine [name] , Thank you for taking time to come for your Medicare Wellness Visit. I appreciate your ongoing commitment to your health goals. Please review the following plan we discussed and let me know if I can assist you in the future.   Screening recommendations/referrals: Colonoscopy: Completed in 2019 Mammogram: Due now patient will schedule Recommended yearly ophthalmology/optometry visit for glaucoma screening and checkup Recommended yearly dental visit for hygiene and checkup  Vaccinations: Influenza vaccine: Due 05-03-21 Pneumococcal vaccine: Patient stated she completed Tdap vaccine: Patient stated completed Shingles vaccine: Due now    Advanced directives: Patient is going to discuss with daughters in the near future  Conditions/risks identified: Hypertension  Next appointment: 1 year   Preventive Care 40-64 Years, Female Preventive care refers to lifestyle choices and visits with your health care provider that can promote health and wellness. What does preventive care include? A yearly physical exam. This is also called an annual well check. Dental exams once or twice a year. Routine eye exams. Ask your health care provider how often you should have your eyes checked. Personal lifestyle choices, including: Daily care of your teeth and gums. Regular physical activity. Eating a healthy diet. Avoiding tobacco and drug use. Limiting alcohol use. Practicing safe sex. Taking low-dose aspirin daily starting at age 47. Taking vitamin and mineral supplements as recommended by your health care provider. What happens during an annual well check? The services and screenings done by your health care provider during your annual well check will depend on your age, overall health, lifestyle risk factors, and family history of disease. Counseling  Your health care provider may ask you questions about your: Alcohol use. Tobacco use. Drug use. Emotional well-being. Home and  relationship well-being. Sexual activity. Eating habits. Work and work Statistician. Method of birth control. Menstrual cycle. Pregnancy history. Screening  You may have the following tests or measurements: Height, weight, and BMI. Blood pressure. Lipid and cholesterol levels. These may be checked every 5 years, or more frequently if you are over 29 years old. Skin check. Lung cancer screening. You may have this screening every year starting at age 29 if you have a 30-pack-year history of smoking and currently smoke or have quit within the past 15 years. Fecal occult blood test (FOBT) of the stool. You may have this test every year starting at age 54. Flexible sigmoidoscopy or colonoscopy. You may have a sigmoidoscopy every 5 years or a colonoscopy every 10 years starting at age 41. Hepatitis C blood test. Hepatitis B blood test. Sexually transmitted disease (STD) testing. Diabetes screening. This is done by checking your blood sugar (glucose) after you have not eaten for a while (fasting). You may have this done every 1-3 years. Mammogram. This may be done every 1-2 years. Talk to your health care provider about when you should start having regular mammograms. This may depend on whether you have a family history of breast cancer. BRCA-related cancer screening. This may be done if you have a family history of breast, ovarian, tubal, or peritoneal cancers. Pelvic exam and Pap test. This may be done every 3 years starting at age 70. Starting at age 77, this may be done every 5 years if you have a Pap test in combination with an HPV test. Bone density scan. This is done to screen for osteoporosis. You may have this scan if you are at high risk for osteoporosis. Discuss your test results, treatment options, and if necessary, the need for more tests with your  health care provider. Vaccines  Your health care provider may recommend certain vaccines, such as: Influenza vaccine. This is recommended  every year. Tetanus, diphtheria, and acellular pertussis (Tdap, Td) vaccine. You may need a Td booster every 10 years. Zoster vaccine. You may need this after age 71. Pneumococcal 13-valent conjugate (PCV13) vaccine. You may need this if you have certain conditions and were not previously vaccinated. Pneumococcal polysaccharide (PPSV23) vaccine. You may need one or two doses if you smoke cigarettes or if you have certain conditions. Talk to your health care provider about which screenings and vaccines you need and how often you need them. This information is not intended to replace advice given to you by your health care provider. Make sure you discuss any questions you have with your health care provider. Document Released: 10/16/2015 Document Revised: 06/08/2016 Document Reviewed: 07/21/2015 Elsevier Interactive Patient Education  2017 [employer] Prevention in the Home Falls can cause injuries. They can happen to people of all ages. There are many things you can do to make your home safe and to help prevent falls. What can I do on the outside of my home? Regularly fix the edges of walkways and driveways and fix any cracks. Remove anything that might make you trip as you walk through a door, such as a raised step or threshold. Trim any bushes or trees on the path to your home. Use bright outdoor lighting. Clear any walking paths of anything that might make someone trip, such as rocks or tools. Regularly check to see if handrails are loose or broken. Make sure that both sides of any steps have handrails. Any raised decks and porches should have guardrails on the edges. Have any leaves, snow, or ice cleared regularly. Use sand or salt on walking paths during winter. Clean up any spills in your garage right away. This includes oil or grease spills. What can I do in the bathroom? Use night lights. Install grab bars by the toilet and in the tub and shower. Do not use towel bars as  grab bars. Use non-skid mats or decals in the tub or shower. If you need to sit down in the shower, use a plastic, non-slip stool. Keep the floor dry. Clean up any water that spills on the floor as soon as it happens. Remove soap buildup in the tub or shower regularly. Attach bath mats securely with double-sided non-slip rug tape. Do not have throw rugs and other things on the floor that can make you trip. What can I do in the bedroom? Use night lights. Make sure that you have a light by your bed that is easy to reach. Do not use any sheets or blankets that are too big for your bed. They should not hang down onto the floor. Have a firm chair that has side arms. You can use this for support while you get dressed. Do not have throw rugs and other things on the floor that can make you trip. What can I do in the kitchen? Clean up any spills right away. Avoid walking on wet floors. Keep items that you use a lot in easy-to-reach places. If you need to reach something above you, use a strong step stool that has a grab bar. Keep electrical cords out of the way. Do not use floor polish or wax that makes floors slippery. If you must use wax, use non-skid floor wax. Do not have throw rugs and other things on the floor that  can make you trip. What can I do with my stairs? Do not leave any items on the stairs. Make sure that there are handrails on both sides of the stairs and use them. Fix handrails that are broken or loose. Make sure that handrails are as long as the stairways. Check any carpeting to make sure that it is firmly attached to the stairs. Fix any carpet that is loose or worn. Avoid having throw rugs at the top or bottom of the stairs. If you do have throw rugs, attach them to the floor with carpet tape. Make sure that you have a light switch at the top of the stairs and the bottom of the stairs. If you do not have them, ask someone to add them for you. What else can I do to help prevent  falls? Wear shoes that: Do not have high heels. Have rubber bottoms. Are comfortable and fit you well. Are closed at the toe. Do not wear sandals. If you use a stepladder: Make sure that it is fully opened. Do not climb a closed stepladder. Make sure that both sides of the stepladder are locked into place. Ask someone to hold it for you, if possible. Clearly mark and make sure that you can see: Any grab bars or handrails. First and last steps. Where the edge of each step is. Use tools that help you move around (mobility aids) if they are needed. These include: Canes. Walkers. Scooters. Crutches. Turn on the lights when you go into a dark area. Replace any light bulbs as soon as they burn out. Set up your furniture so you have a clear path. Avoid moving your furniture around. If any of your floors are uneven, fix them. If there are any pets around you, be aware of where they are. Review your medicines with your doctor. Some medicines can make you feel dizzy. This can increase your chance of falling. Ask your doctor what other things that you can do to help prevent falls. This information is not intended to replace advice given to you by your health care provider. Make sure you discuss any questions you have with your health care provider. Document Released: 07/16/2009 Document Revised: 02/25/2016 Document Reviewed: 10/24/2014 Elsevier Interactive Patient Education  2017 [employer].

## 2021-04-21 NOTE — Telephone Encounter (Signed)
Pt wanted to let you know that the cephelexin that she was on did help for UTI however she is still having some burning and frequent urination after being off the medicine for a week.... Please advise

## 2021-04-22 NOTE — Telephone Encounter (Signed)
Lets repeat the UA and see if anything is growing.

## 2021-04-22 NOTE — Telephone Encounter (Signed)
Pt informed. Will bring UA by in the morning and will do a WORK IN virtual visit in the afternoon.

## 2021-04-23 ENCOUNTER — Other Ambulatory Visit: Payer: Self-pay

## 2021-04-23 ENCOUNTER — Ambulatory Visit: Payer: Medicare Other | Admitting: Family Medicine

## 2021-05-06 NOTE — Progress Notes (Signed)
I connected with  Katherine [name] on 05/06/21 by an audio enabled telemedicine application and verified that I am speaking with the correct person using two identifiers.   I discussed the limitations, risks, security and privacy concerns of performing an evaluation and management service by telephone and the availability of in person appointments. I also discussed with the patient that there may be a patient responsible charge related to this service. The patient expressed understanding and verbally consented to this telephonic visit.  The patient was at home. The provider was Porfirio Mylar NP who was in office. This was an audio telehealth visit.

## 2021-09-17 ENCOUNTER — Ambulatory Visit
Admission: EM | Admit: 2021-09-17 | Discharge: 2021-09-17 | Disposition: A | Discharge status: Home / Self Care | Payer: Medicare Other | Attending: Family Medicine | Admitting: Family Medicine

## 2021-09-17 ENCOUNTER — Encounter: Payer: Self-pay | Admitting: Emergency Medicine

## 2021-09-17 ENCOUNTER — Other Ambulatory Visit: Payer: Self-pay

## 2021-09-17 DIAGNOSIS — N390 Urinary tract infection, site not specified: Secondary | ICD-10-CM | POA: Diagnosis not present

## 2021-09-17 DIAGNOSIS — Z20822 Contact with and (suspected) exposure to covid-19: Secondary | ICD-10-CM | POA: Diagnosis not present

## 2021-09-17 DIAGNOSIS — J069 Acute upper respiratory infection, unspecified: Secondary | ICD-10-CM | POA: Diagnosis not present

## 2021-09-17 DIAGNOSIS — R059 Cough, unspecified: Secondary | ICD-10-CM

## 2021-09-17 LAB — POCT URINALYSIS DIP (MANUAL ENTRY)
Bilirubin, UA: NEGATIVE
Glucose, UA: NEGATIVE mg/dL
Ketones, POC UA: NEGATIVE mg/dL
Leukocytes, UA: NEGATIVE
Nitrite, UA: POSITIVE — AB
Protein Ur, POC: 30 mg/dL — AB
Spec Grav, UA: 1.025 (ref 1.010–1.025)
Urobilinogen, UA: 0.2 E.U./dL
pH, UA: 6 (ref 5.0–8.0)

## 2021-09-17 MED ORDER — CEPHALEXIN 500 MG PO CAPS: 500.0000 mg | ORAL_CAPSULE | Freq: Two times a day (BID) | ORAL | 0 refills | Status: DC

## 2021-09-17 MED ORDER — PROMETHAZINE-DM 6.25-15 MG/5ML PO SYRP: 5.0000 mL | ORAL_SOLUTION | Freq: Four times a day (QID) | ORAL | 0 refills | Status: DC | PRN

## 2021-09-17 NOTE — ED Triage Notes (Signed)
Chills since Wednesday.  Headache, nasal congestion, cough.  States she is having frequent urination and lower back since Tuesday.  Has been taking AZO.

## 2021-09-18 LAB — COVID-19, FLU A+B NAA
Influenza A, NAA: NOT DETECTED
Influenza B, NAA: NOT DETECTED
SARS-CoV-2, NAA: DETECTED — AB

## 2021-09-21 NOTE — ED Provider Notes (Signed)
[facility]    CSN: [id number] Arrival date & time: 09/17/21  1427      History   Chief Complaint No chief complaint on file.   HPI Katherine [name] is a 55 y.o. female.   Presenting today with several day history of chills, headache, congestion, cough, fatigue. Also for about the same duration having dysuria, urinary frequency, and low back aching. Denies N/V/D, abdominal pain, bowel changes, CP, SOB. Taking AZO for urinary sxs with temporary relief and OTC cold and congestion medications for URI sxs. Multiple sick contacts recently. No known hx of pulmonary dz or frequent UTIs.    Past Medical History:  Diagnosis Date   Asthma    [facility]reesia 04/18/2020   Depression    [facility]ia 04/18/2020    Patient Active Problem List   Diagnosis Date Noted   Essential hypertension 04/21/2020   Dysuria 04/21/2020   Mild persistent asthma 04/21/2020   Encounter for screening mammogram for malignant neoplasm of breast 04/21/2020   Encounter for screening for malignant neoplasm of cervix 04/21/2020    Past Surgical History:  Procedure Laterality Date   CESAREAN SECTION N/A    [facility]reesia 04/18/2020   FRACTURE SURGERY N/A    [facility]ia 04/18/2020   TUBAL LIGATION N/A    [facility]reesia 04/18/2020    OB History   No obstetric history on file.      Home Medications    Prior to Admission medications   Medication Sig Start Date End Date Taking? Authorizing Provider  cephALEXin (KEFLEX) 500 MG capsule Take 1 capsule (500 mg total) by mouth 2 (two) times daily. 09/17/21  Yes Volney American, PA-C  albuterol (VENTOLIN HFA) 108 (90 Base) MCG/ACT inhaler Inhale into the lungs.    [provider]  budesonide-formoterol (SYMBICORT) 80-4.5 MCG/ACT inhaler Inhale 1 puff into the lungs daily. 04/21/20   Perlie Mayo, NP  promethazine-dextromethorphan (PROMETHAZINE-DM) 6.25-15 MG/5ML syrup Take 5 mLs by mouth 4 (four) times daily as needed for cough. 09/17/21   Volney American, PA-C    Family History Family History  Problem Relation Age of Onset   Hypothyroidism Mother    Hypertension Mother    Hypertension Father    Stroke Father    Hypothyroidism Sister     Social History Social History   Tobacco Use   Smoking status: Never   Smokeless tobacco: Never  Substance Use Topics   Alcohol use: Never   Drug use: Never     Allergies   Patient has no known allergies.   Review of Systems Review of Systems PER HPI   Physical Exam Triage Vital Signs ED Triage Vitals  Enc Vitals Group     BP 09/17/21 1604 122/76     Pulse Rate 09/17/21 1604 95     Resp 09/17/21 1604 18     Temp 09/17/21 1604 97.9 F (36.6 C)     Temp Source 09/17/21 1604 Oral     SpO2 09/17/21 1604 94 %     Weight --      Height --      Head Circumference --      Peak Flow --      Pain Score 09/17/21 1605 6     Pain Loc --      Pain Edu? --      Excl. in [place]? --    No data found.  Updated Vital Signs BP 122/76 (BP Location: Right Arm)    Pulse 95    Temp  97.9 F (36.6 C) (Oral)    Resp 18    SpO2 94%   Visual Acuity Right Eye Distance:   Left Eye Distance:   Bilateral Distance:    Right Eye Near:   Left Eye Near:    Bilateral Near:     Physical Exam Vitals and nursing note reviewed.  Constitutional:      Appearance: Normal appearance.  HENT:     Head: Atraumatic.     Right Ear: Tympanic membrane and external ear normal.     Left Ear: Tympanic membrane and external ear normal.     Nose: Rhinorrhea present.     Mouth/Throat:     Mouth: Mucous membranes are moist.     Pharynx: Posterior oropharyngeal erythema present.  Eyes:     Extraocular Movements: Extraocular movements intact.     Conjunctiva/sclera: Conjunctivae normal.  Cardiovascular:     Rate and Rhythm: Normal rate and regular rhythm.     Heart sounds: Normal heart sounds.  Pulmonary:     Effort: Pulmonary effort is normal.     Breath sounds: Normal breath sounds. No wheezing or  rales.  Abdominal:     General: Bowel sounds are normal. There is no distension.     Palpations: Abdomen is soft.     Tenderness: There is no abdominal tenderness. There is no right CVA tenderness, left CVA tenderness or guarding.  Musculoskeletal:        General: Normal range of motion.     Cervical back: Normal range of motion and neck supple.  Skin:    General: Skin is warm and dry.  Neurological:     Mental Status: She is alert and oriented to person, place, and time.  Psychiatric:        Mood and Affect: Mood normal.        Thought Content: Thought content normal.     UC Treatments / Results  Labs (all labs ordered are listed, but only abnormal results are displayed) Labs Reviewed  COVID-19, FLU A+B NAA - Abnormal; Notable for the following components:      Result Value   SARS-CoV-2, NAA Detected (*)    All other components within normal limits   Narrative:    Performed at:  [facility] [address] [place], [place]  [id number] Lab Director: Rush Farmer MD, Phone:  [phone]  POCT URINALYSIS DIP (MANUAL ENTRY) - Abnormal; Notable for the following components:   Color, UA orange (*)    Blood, UA trace-intact (*)    Protein Ur, POC =30 (*)    Nitrite, UA Positive (*)    All other components within normal limits    EKG   Radiology No results found.  Procedures Procedures (including critical care time)  Medications Ordered in UC Medications - No data to display  Initial Impression / Assessment and Plan / UC Course  I have reviewed the triage vital signs and the nursing notes.  Pertinent labs & imaging results that were available during my care of the patient were reviewed by me and considered in my medical decision making (see chart for details).     Vital signs reassuring, suspect viral URI and UTI. U/A showing UTI, but patient taking AZO so will await culture and treat with keflex. Phenergan dm sent and discussed OTC remedies and  supportive care while awaiting viral resp panel. Return precautions reviewed.   Final Clinical Impressions(s) / UC Diagnoses   Final diagnoses:  Exposure to COVID-19 virus  Acute lower UTI  Viral URI with cough   Discharge Instructions   None    ED Prescriptions     Medication Sig Dispense Auth. Provider   promethazine-dextromethorphan (PROMETHAZINE-DM) 6.25-15 MG/5ML syrup Take 5 mLs by mouth 4 (four) times daily as needed for cough. 118 mL Volney American, PA-C   cephALEXin (KEFLEX) 500 MG capsule Take 1 capsule (500 mg total) by mouth 2 (two) times daily. 10 capsule Volney American, [place]      PDMP not reviewed this encounter.   Volney American, [place] 09/21/21 2237

## 2021-11-04 ENCOUNTER — Telehealth: Payer: Medicare HMO | Admitting: Physician Assistant

## 2021-11-04 DIAGNOSIS — K529 Noninfective gastroenteritis and colitis, unspecified: Secondary | ICD-10-CM | POA: Diagnosis not present

## 2021-11-04 MED ORDER — DICYCLOMINE HCL 10 MG PO CAPS: 10.0000 mg | ORAL_CAPSULE | Freq: Three times a day (TID) | ORAL | 0 refills | Status: DC

## 2021-11-04 MED ORDER — ONDANSETRON 4 MG PO TBDP: 4.0000 mg | ORAL_TABLET | Freq: Three times a day (TID) | ORAL | 0 refills | Status: DC | PRN

## 2021-11-04 NOTE — Progress Notes (Signed)
Virtual Visit Consent   Katherine [name], you are scheduled for a virtual visit with a [facility] provider today.     Just as with appointments in the office, your consent must be obtained to participate.  Your consent will be active for this visit and any virtual visit you may have with one of our providers in the next 365 days.     If you have a MyChart account, a copy of this consent can be sent to you electronically.  All virtual visits are billed to your insurance company just like a traditional visit in the office.    As this is a virtual visit, video technology does not allow for your provider to perform a traditional examination.  This may limit your provider's ability to fully assess your condition.  If your provider identifies any concerns that need to be evaluated in person or the need to arrange testing (such as labs, EKG, etc.), we will make arrangements to do so.     Although advances in technology are sophisticated, we cannot ensure that it will always work on either your end or our end.  If the connection with a video visit is poor, the visit may have to be switched to a telephone visit.  With either a video or telephone visit, we are not always able to ensure that we have a secure connection.     I need to obtain your verbal consent now.   Are you willing to proceed with your visit today?    Louann Hopson has provided verbal consent on 11/04/2021 for a virtual visit (video or telephone).   Leeanne Rio, [place]   Date: 11/04/2021 3:39 PM   Virtual Visit via Video Note   I, Leeanne Rio, connected with  Markitta Ausburn  ([id number], Jun 09, 1966) on 11/04/21 at  3:30 PM EST by a video-enabled telemedicine application and verified that I am speaking with the correct person using two identifiers.  Location: Patient: Virtual Visit Location Patient: Home Provider: Virtual Visit Location Provider: Home Office   I discussed the limitations of evaluation and management by  telemedicine and the availability of in person appointments. The patient expressed understanding and agreed to proceed.    History of Present Illness: Katherine [name] is a 56 y.o. who identifies as a female who was assigned female at birth, and is being seen today for some GI symptoms. Notes Monday began having some nausea and diarrhea that eased up over a period of 24 hours. A couple of episodes of non-bloody emesis that day as well. None since. Since then has had some stomach cramping, generalized.  Notes some belching and bloating but denies noting true heartburn or indigestion. Denies fever, aches. Some chills initially with the nausea.   HPI: HPI  Problems:  Patient Active Problem List   Diagnosis Date Noted   Essential hypertension 04/21/2020   Dysuria 04/21/2020   Mild persistent asthma 04/21/2020   Encounter for screening mammogram for malignant neoplasm of breast 04/21/2020   Encounter for screening for malignant neoplasm of cervix 04/21/2020    Allergies: No Known Allergies Medications: No current outpatient medications on file.  Observations/Objective: Patient is well-developed, well-nourished in no acute distress.  Resting comfortably at home.  Head is normocephalic, atraumatic.  No labored breathing. Speech is clear and coherent with logical content.  Patient is alert and oriented at baseline.   Assessment and Plan: 1. Gastroenteritis  Seems viral in nature giving constellation of symptoms and that her nausea/vomiting are  improved already. No fever or other alarm signs/symptoms present. Supportive measures and OTC medications reviewed. Start [employer]. Rx Zofran and dicyclomine to help with nausea and residual cramping. Strict in-person assessment precautions reviewed. ER precautions reviewed.   Follow Up Instructions: I discussed the assessment and treatment plan with the patient. The patient was provided an opportunity to ask questions and all were answered. The patient  agreed with the plan and demonstrated an understanding of the instructions.  A copy of instructions were sent to the patient via MyChart unless otherwise noted below.   The patient was advised to call back or seek an in-person evaluation if the symptoms worsen or if the condition fails to improve as anticipated.  Time:  I spent 12 minutes with the patient via telehealth technology discussing the above problems/concerns.    Leeanne Rio, PA-C

## 2021-11-04 NOTE — Addendum Note (Signed)
Addended by: Brunetta Jeans on: 11/04/2021 03:57 PM   Modules accepted: Orders

## 2021-11-04 NOTE — Patient Instructions (Addendum)
[name], thank you for joining Leeanne Rio, PA-C for today's virtual visit.  While this provider is not your primary care provider (PCP), if your PCP is located in our provider database this encounter information will be shared with them immediately following your visit.  Consent: (Patient) Katherine [name] provided verbal consent for this virtual visit at the beginning of the encounter.  Current Medications:  Current Outpatient Medications:    albuterol (VENTOLIN HFA) 108 (90 Base) MCG/ACT inhaler, Inhale into the lungs., Disp: , Rfl:    budesonide-formoterol (SYMBICORT) 80-4.5 MCG/ACT inhaler, Inhale 1 puff into the lungs daily., Disp: 1 Inhaler, Rfl: 3   cephALEXin (KEFLEX) 500 MG capsule, Take 1 capsule (500 mg total) by mouth 2 (two) times daily., Disp: 10 capsule, Rfl: 0   promethazine-dextromethorphan (PROMETHAZINE-DM) 6.25-15 MG/5ML syrup, Take 5 mLs by mouth 4 (four) times daily as needed for cough., Disp: 118 mL, Rfl: 0   Medications ordered in this encounter:  No orders of the defined types were placed in this encounter.    *If you need refills on other medications prior to your next appointment, please contact your pharmacy*  Follow-Up: Call back or seek an in-person evaluation if the symptoms worsen or if the condition fails to improve as anticipated.  Other Instructions Please keep well-hydrated and get plenty of rest. Follow the dietary recommendations below. Start a daily probiotic. You can use the Zofran as directed for nausea. The Bentyl is to help with cramping.  Symptoms should continue to improve until resolved. If not or you note any new or worsening symptoms, you need to be evaluated in-person ASAP.   Any new onset fever, recurrent vomiting or blood in stool, or for any severe focal abdominal pain -- ER.   Bland Diet A bland diet consists of foods that are often soft and do not have a lot of fat, fiber, or extra seasonings. Foods without fat, fiber,  or seasoning are easier for the body to digest. They are also less likely to irritate your mouth, throat, stomach, and other parts of your digestive system. A bland diet is sometimes called a BRAT diet. What is my plan? Your health care provider or food and nutrition specialist (dietitian) may recommend specific changes to your diet to prevent symptoms or to treat your symptoms. These changes may include: Eating small meals often. Cooking food until it is soft enough to chew easily. Chewing your food well. Drinking fluids slowly. Not eating foods that are very spicy, sour, or fatty. Not eating citrus fruits, such as oranges and grapefruit. What do I need to know about this diet? Eat a variety of foods from the bland diet food list. Do not follow a bland diet longer than needed. Ask your health care provider whether you should take vitamins or supplements. What foods can I eat? Grains Hot cereals, such as cream of wheat. Rice. Bread, crackers, or tortillas made from refined white flour. Vegetables Canned or cooked vegetables. Mashed or boiled potatoes. Fruits Bananas. Applesauce. Other types of cooked or canned fruit with the skin and seeds removed, such as canned peaches or pears. Meats and other proteins Scrambled eggs. Creamy peanut butter or other nut butters. Lean, well-cooked meats, such as chicken or fish. Tofu. Soups or broths. Dairy Low-fat dairy products, such as milk, cottage cheese, or yogurt. Beverages Water. Herbal tea. Apple juice. Fats and oils Mild salad dressings. Canola or olive oil. Sweets and desserts Pudding. Custard. Fruit gelatin. Ice cream. The items listed above  may not be a complete list of recommended foods and beverages. Contact a dietitian for more options. What foods are not recommended? Grains Whole grain breads and cereals. Vegetables Raw vegetables. Fruits Raw fruits, especially citrus, berries, or dried fruits. Dairy Whole fat dairy  foods. Beverages Caffeinated drinks. Alcohol. Seasonings and condiments Strongly flavored seasonings or condiments. Hot sauce. Salsa. Other foods Spicy foods. Fried foods. Sour foods, such as pickled or fermented foods. Foods with high sugar content. Foods high in fiber. The items listed above may not be a complete list of foods and beverages to avoid. Contact a dietitian for more information. Summary A bland diet consists of foods that are often soft and do not have a lot of fat, fiber, or extra seasonings. Foods without fat, fiber, or seasoning are easier for the body to digest. Check with your health care provider to see how long you should follow this diet plan. It is not meant to be followed for long periods. This information is not intended to replace advice given to you by your health care provider. Make sure you discuss any questions you have with your health care provider. Document Revised: 10/18/2017 Document Reviewed: 10/18/2017 Elsevier Patient Education  2022 [employer].    If you have been instructed to have an in-person evaluation today at a local Urgent Care facility, please use the link below. It will take you to a list of all of our available [facility], including address, phone number and hours of operation. Please do not delay care.  [facility]  If you or a family member do not have a primary care provider, use the link below to schedule a visit and establish care. When you choose a [facility] primary care physician or advanced practice provider, you gain a long-term partner in health. Find a Primary Care Provider  Learn more about [facility]'s in-office and virtual care options: [facility]

## 2021-11-09 ENCOUNTER — Encounter: Payer: Self-pay | Admitting: Internal Medicine

## 2021-11-09 ENCOUNTER — Other Ambulatory Visit: Payer: Self-pay

## 2021-11-09 ENCOUNTER — Ambulatory Visit (INDEPENDENT_AMBULATORY_CARE_PROVIDER_SITE_OTHER): Payer: Medicare HMO | Admitting: Internal Medicine

## 2021-11-09 VITALS — BP 125/60 | HR 97 | Ht 60.0 in | Wt 139.1 lb

## 2021-11-09 DIAGNOSIS — Z1211 Encounter for screening for malignant neoplasm of colon: Secondary | ICD-10-CM | POA: Diagnosis not present

## 2021-11-09 DIAGNOSIS — K219 Gastro-esophageal reflux disease without esophagitis: Secondary | ICD-10-CM | POA: Insufficient documentation

## 2021-11-09 DIAGNOSIS — K2900 Acute gastritis without bleeding: Secondary | ICD-10-CM | POA: Diagnosis not present

## 2021-11-09 MED ORDER — OMEPRAZOLE 20 MG PO CPDR: 20.0000 mg | DELAYED_RELEASE_CAPSULE | Freq: Every day | ORAL | 3 refills | Status: DC

## 2021-11-09 NOTE — Assessment & Plan Note (Signed)
Could be recent gastroenteritis Diarrhea resolved now Epigastric pain could be due to remaining gastritis from decreased p.o. intake Started omeprazole as needed Her LLQ pain could be due to diverticulitis, needs screening colonoscopy, referred to GI

## 2021-11-09 NOTE — Progress Notes (Signed)
Acute Office Visit  Subjective:    Patient ID: Katherine [name], female    DOB: [birth year], 56 y.o.   MRN: [id number]  Chief Complaint  Patient presents with   Abdominal Pain    Recently had stomach virus cramping went away but tender above belly button now burping.    HPI Patient is in today for complaint of epigastric and periumbilical pain for the last 3 days.  She started having LLQ pain about 3 days ago with diarrhea and an episode of blood in stool.  Her diarrhea has resolved now.  She also reported nausea initially, which has improved now.  She denies any fever or chills.  Denies any outside food recently.  She was given Bentyl through E-visit, which has helped with abdominal cramping.  Past Medical History:  Diagnosis Date   Asthma    Phreesia 04/18/2020   Depression    [employer]reesia 04/18/2020    Past Surgical History:  Procedure Laterality Date   CESAREAN SECTION N/A    [employer]reesia 04/18/2020   FRACTURE SURGERY N/A    Phreesia 04/18/2020   TUBAL LIGATION N/A    Phreesia 04/18/2020    Family History  Problem Relation Age of Onset   Hypothyroidism Mother    Hypertension Mother    Hypertension Father    Stroke Father    Hypothyroidism Sister     Social History   Socioeconomic History   Marital status: Divorced    Spouse name: Not on file   Number of children: 4   Years of education: Not on file   Highest education level: 10th grade  Occupational History   Not on file  Tobacco Use   Smoking status: Never   Smokeless tobacco: Never  Substance and Sexual Activity   Alcohol use: Never   Drug use: Never   Sexual activity: Yes    Partners: Male    Birth control/protection: Surgical  Other Topics Concern   Not on file  Social History Narrative   Lives alone   4 children-one in [place], [place], [place], and [place] in the [place] children 2      Enjoy: spending time with family, children visit often       Diets: eats all food groups    Caffeine: 1  cup coffee twice week   Water: 6-8 cups      Wears seat belt    Does not use phone while driving-handfree   Smoke detectors at home   No weapons in the house    Social Determinants of Health   Financial Resource Strain: Low Risk    Difficulty of Paying Living Expenses: Not hard at all  Food Insecurity: No Food Insecurity   Worried About Charity fundraiser in the Last Year: Never true   Arboriculturist in the Last Year: Never true  Transportation Needs: No Transportation Needs   Lack of Transportation (Medical): No   Lack of Transportation (Non-Medical): No  Physical Activity: Sufficiently Active   Days of Exercise per Week: 4 days   Minutes of Exercise per Session: 60 min  Stress: No Stress Concern Present   Feeling of Stress : Not at all  Social Connections: Moderately Isolated   Frequency of Communication with Friends and Family: More than three times a week   Frequency of Social Gatherings with Friends and Family: More than three times a week   Attends Religious Services: More than 4 times per year   Active Member  of Clubs or Organizations: No   Attends Archivist Meetings: Never   Marital Status: Divorced  Human resources officer Violence: Not At Risk   Fear of Current or Ex-Partner: No   Emotionally Abused: No   Physically Abused: No   Sexually Abused: No    Outpatient Medications Prior to Visit  Medication Sig Dispense Refill   dicyclomine (BENTYL) 10 MG capsule Take 1 capsule (10 mg total) by mouth 3 (three) times daily before meals. 15 capsule 0   ondansetron (ZOFRAN-ODT) 4 MG disintegrating tablet Take 1 tablet (4 mg total) by mouth every 8 (eight) hours as needed for nausea or vomiting. 20 tablet 0   No facility-administered medications prior to visit.    No Known Allergies  Review of Systems  Constitutional:  Negative for chills and fever.  HENT:  Negative for congestion, sinus pressure, sinus pain and sore throat.   Eyes:  Negative for pain and  discharge.  Respiratory:  Negative for cough and shortness of breath.   Cardiovascular:  Negative for chest pain and palpitations.  Gastrointestinal:  Positive for abdominal pain and nausea. Negative for constipation and vomiting.  Endocrine: Negative for polydipsia and polyuria.  Genitourinary:  Negative for dysuria and hematuria.  Musculoskeletal:  Negative for neck pain and neck stiffness.  Skin:  Negative for rash.  Neurological:  Negative for dizziness and weakness.  Psychiatric/Behavioral:  Negative for agitation and behavioral problems.       Objective:    Physical Exam Vitals reviewed.  Constitutional:      General: She is not in acute distress.    Appearance: She is not diaphoretic.  HENT:     Head: Normocephalic and atraumatic.     Mouth/Throat:     Mouth: Mucous membranes are moist.  Eyes:     General: No scleral icterus.    Extraocular Movements: Extraocular movements intact.  Cardiovascular:     Rate and Rhythm: Normal rate and regular rhythm.     Pulses: Normal pulses.     Heart sounds: Normal heart sounds. No murmur heard. Pulmonary:     Breath sounds: Normal breath sounds. No wheezing or rales.  Abdominal:     Palpations: Abdomen is soft.     Tenderness: There is abdominal tenderness (Epigastric).  Musculoskeletal:     Cervical back: Neck supple. No tenderness.     Right lower leg: No edema.     Left lower leg: No edema.  Skin:    General: Skin is warm.     Findings: No rash.  Neurological:     General: No focal deficit present.     Mental Status: She is alert and oriented to person, place, and time.  Psychiatric:        Mood and Affect: Mood normal.        Behavior: Behavior normal.    BP 125/60    Pulse 97    Ht 5' (1.524 m)    Wt 139 lb 1.3 oz (63.1 kg)    SpO2 96%    BMI 27.16 kg/m  Wt Readings from Last 3 Encounters:  11/09/21 139 lb 1.3 oz (63.1 kg)  10/01/20 140 lb (63.5 kg)  04/21/20 143 lb 1.9 oz (64.9 kg)        Assessment & Plan:    Problem List Items Addressed This Visit       Digestive   Acute gastritis without hemorrhage - Primary    Could be recent gastroenteritis Diarrhea resolved now Epigastric pain  could be due to remaining gastritis from decreased p.o. intake Started omeprazole as needed Her LLQ pain could be due to diverticulitis, needs screening colonoscopy, referred to GI      Relevant Medications   omeprazole (PRILOSEC) 20 MG capsule   Other Visit Diagnoses     Screen for colon cancer       Relevant Orders   Ambulatory referral to Gastroenterology        Meds ordered this encounter  Medications   omeprazole (PRILOSEC) 20 MG capsule    Sig: Take 1 capsule (20 mg total) by mouth daily.    Dispense:  30 capsule    Refill:  3     Fusae Florio Keith Rake, MD

## 2021-11-11 ENCOUNTER — Other Ambulatory Visit: Payer: Self-pay | Admitting: Internal Medicine

## 2021-11-11 ENCOUNTER — Telehealth: Payer: Self-pay

## 2021-11-11 DIAGNOSIS — B001 Herpesviral vesicular dermatitis: Secondary | ICD-10-CM

## 2021-11-11 MED ORDER — VALACYCLOVIR HCL 1 G PO TABS: 1000.0000 mg | ORAL_TABLET | Freq: Two times a day (BID) | ORAL | 0 refills | Status: AC

## 2021-11-11 NOTE — Telephone Encounter (Signed)
Patient called seen Dr Posey Pronto on 11/09/2021 and medicine was given not sure if this is from the medicine or not, I have cold sores on outside of mouth and fill out 2 is coming up on inside of lip, can anti viral medicine be called into her pharmacy for cold sores.   Pharmacy: [facility]

## 2021-11-11 NOTE — Telephone Encounter (Signed)
Pt advised with verbal understanding  °

## 2021-11-15 ENCOUNTER — Encounter (INDEPENDENT_AMBULATORY_CARE_PROVIDER_SITE_OTHER): Payer: Self-pay | Admitting: *Deleted

## 2021-11-29 ENCOUNTER — Telehealth: Payer: Medicare HMO | Admitting: Physician Assistant

## 2021-11-29 DIAGNOSIS — R3989 Other symptoms and signs involving the genitourinary system: Secondary | ICD-10-CM

## 2021-11-29 MED ORDER — SULFAMETHOXAZOLE-TRIMETHOPRIM 800-160 MG PO TABS: 1.0000 | ORAL_TABLET | Freq: Two times a day (BID) | ORAL | 0 refills | Status: DC

## 2021-11-29 NOTE — Patient Instructions (Signed)
[name], thank you for joining Mar Daring, PA-C for today's virtual visit.  While this provider is not your primary care provider (PCP), if your PCP is located in our provider database this encounter information will be shared with them immediately following your visit.  Consent: (Patient) Katherine [name] provided verbal consent for this virtual visit at the beginning of the encounter.  Current Medications:  Current Outpatient Medications:    sulfamethoxazole-trimethoprim (BACTRIM DS) 800-160 MG tablet, Take 1 tablet by mouth 2 (two) times daily., Disp: 10 tablet, Rfl: 0   dicyclomine (BENTYL) 10 MG capsule, Take 1 capsule (10 mg total) by mouth 3 (three) times daily before meals., Disp: 15 capsule, Rfl: 0   omeprazole (PRILOSEC) 20 MG capsule, Take 1 capsule (20 mg total) by mouth daily., Disp: 30 capsule, Rfl: 3   ondansetron (ZOFRAN-ODT) 4 MG disintegrating tablet, Take 1 tablet (4 mg total) by mouth every 8 (eight) hours as needed for nausea or vomiting., Disp: 20 tablet, Rfl: 0   Medications ordered in this encounter:  Meds ordered this encounter  Medications   sulfamethoxazole-trimethoprim (BACTRIM DS) 800-160 MG tablet    Sig: Take 1 tablet by mouth 2 (two) times daily.    Dispense:  10 tablet    Refill:  0    Order Specific Question:   Supervising Provider    Answer:   Sabra Heck, BRIAN [[id number]]     *If you need refills on other medications prior to your next appointment, please contact your pharmacy*  Follow-Up: Call back or seek an in-person evaluation if the symptoms worsen or if the condition fails to improve as anticipated.  Other Instructions Urinary Tract Infection, Adult A urinary tract infection (UTI) is an infection of any part of the urinary tract. The urinary tract includes the kidneys, ureters, bladder, and urethra. These organs make, store, and get rid of urine in the body. An upper UTI affects the ureters and kidneys. A lower UTI affects the bladder and  urethra. What are the causes? Most urinary tract infections are caused by bacteria in your genital area around your urethra, where urine leaves your body. These bacteria grow and cause inflammation of your urinary tract. What increases the risk? You are more likely to develop this condition if: You have a urinary catheter that stays in place. You are not able to control when you urinate or have a bowel movement (incontinence). You are female and you: Use a spermicide or diaphragm for birth control. Have low estrogen levels. Are pregnant. You have certain genes that increase your risk. You are sexually active. You take antibiotic medicines. You have a condition that causes your flow of urine to slow down, such as: An enlarged prostate, if you are female. Blockage in your urethra. A kidney stone. A nerve condition that affects your bladder control (neurogenic bladder). Not getting enough to drink, or not urinating often. You have certain medical conditions, such as: Diabetes. A weak disease-fighting system (immunesystem). Sickle cell disease. Gout. Spinal cord injury. What are the signs or symptoms? Symptoms of this condition include: Needing to urinate right away (urgency). Frequent urination. This may include small amounts of urine each time you urinate. Pain or burning with urination. Blood in the urine. Urine that smells bad or unusual. Trouble urinating. Cloudy urine. Vaginal discharge, if you are female. Pain in the abdomen or the lower back. You may also have: Vomiting or a decreased appetite. Confusion. Irritability or tiredness. A fever or chills. Diarrhea. The first symptom  in older adults may be confusion. In some cases, they may not have any symptoms until the infection has worsened. How is this diagnosed? This condition is diagnosed based on your medical history and a physical exam. You may also have other tests, including: Urine tests. Blood tests. Tests for  STIs (sexually transmitted infections). If you have had more than one UTI, a cystoscopy or imaging studies may be done to determine the cause of the infections. How is this treated? Treatment for this condition includes: Antibiotic medicine. Over-the-counter medicines to treat discomfort. Drinking enough water to stay hydrated. If you have frequent infections or have other conditions such as a kidney stone, you may need to see a health care provider who specializes in the urinary tract (urologist). In rare cases, urinary tract infections can cause sepsis. Sepsis is a life-threatening condition that occurs when the body responds to an infection. Sepsis is treated in the hospital with IV antibiotics, fluids, and other medicines. Follow these instructions at home: Medicines Take over-the-counter and prescription medicines only as told by your health care provider. If you were prescribed an antibiotic medicine, take it as told by your health care provider. Do not stop using the antibiotic even if you start to feel better. General instructions Make sure you: Empty your bladder often and completely. Do not hold urine for long periods of time. Empty your bladder after sex. Wipe from front to back after urinating or having a bowel movement if you are female. Use each tissue only one time when you wipe. Drink enough fluid to keep your urine pale yellow. Keep all follow-up visits. This is important. Contact a health care provider if: Your symptoms do not get better after 1-2 days. Your symptoms go away and then return. Get help right away if: You have severe pain in your back or your lower abdomen. You have a fever or chills. You have nausea or vomiting. Summary A urinary tract infection (UTI) is an infection of any part of the urinary tract, which includes the kidneys, ureters, bladder, and urethra. Most urinary tract infections are caused by bacteria in your genital area. Treatment for this  condition often includes antibiotic medicines. If you were prescribed an antibiotic medicine, take it as told by your health care provider. Do not stop using the antibiotic even if you start to feel better. Keep all follow-up visits. This is important. This information is not intended to replace advice given to you by your health care provider. Make sure you discuss any questions you have with your health care provider. Document Revised: 05/01/2020 Document Reviewed: 05/01/2020 Elsevier Patient Education  2022 [employer].    If you have been instructed to have an in-person evaluation today at a local Urgent Care facility, please use the link below. It will take you to a list of all of our available [facility], including address, phone number and hours of operation. Please do not delay care.  [facility]  If you or a family member do not have a primary care provider, use the link below to schedule a visit and establish care. When you choose a [facility] primary care physician or advanced practice provider, you gain a long-term partner in health. Find a Primary Care Provider  Learn more about 's in-office and virtual care options: [facility]

## 2021-11-29 NOTE — Progress Notes (Signed)
Virtual Visit Consent   [name], you are scheduled for a virtual visit with a [facility] provider today.     Just as with appointments in the office, your consent must be obtained to participate.  Your consent will be active for this visit and any virtual visit you may have with one of our providers in the next 365 days.     If you have a MyChart account, a copy of this consent can be sent to you electronically.  All virtual visits are billed to your insurance company just like a traditional visit in the office.    As this is a virtual visit, video technology does not allow for your provider to perform a traditional examination.  This may limit your provider's ability to fully assess your condition.  If your provider identifies any concerns that need to be evaluated in person or the need to arrange testing (such as labs, EKG, etc.), we will make arrangements to do so.     Although advances in technology are sophisticated, we cannot ensure that it will always work on either your end or our end.  If the connection with a video visit is poor, the visit may have to be switched to a telephone visit.  With either a video or telephone visit, we are not always able to ensure that we have a secure connection.     I need to obtain your verbal consent now.   Are you willing to proceed with your visit today?    Ludene Stokke has provided verbal consent on 11/29/2021 for a virtual visit (video or telephone).   Mar Daring, PA-C   Date: 11/29/2021 3:20 PM   Virtual Visit via Video Note   I, Mar Daring, connected with  Katherine [name]  ([id number], 1966-01-31) on 11/29/21 at  3:15 PM EST by a video-enabled telemedicine application and verified that I am speaking with the correct person using two identifiers.  Location: Patient: Virtual Visit Location Patient: Home Provider: Virtual Visit Location Provider: Home Office   I discussed the limitations of evaluation and management by  telemedicine and the availability of in person appointments. The patient expressed understanding and agreed to proceed.    History of Present Illness: Katherine [name] is a 56 y.o. who identifies as a female who was assigned female at birth, and is being seen today for possible UTI.  HPI: Urinary Tract Infection  This is a new problem. Episode onset: had recent gastroenteritis with diarrhea at the beginning of the month that may have triggered it; symptoms started Thursday. The problem occurs every urination. The quality of the pain is described as burning and aching. The pain is moderate. There has been no fever. Associated symptoms include chills, frequency, hesitancy, sweats and urgency. Pertinent negatives include no flank pain, hematuria, nausea or vomiting. Associated symptoms comments: Back pain, fatigue, malaise. She has tried increased fluids and acetaminophen (AZO) for the symptoms. The treatment provided no relief. Her past medical history is significant for recurrent UTIs. There is no history of catheterization or a urological procedure.     Problems:  Patient Active Problem List   Diagnosis Date Noted   Acute gastritis without hemorrhage 11/09/2021   Essential hypertension 04/21/2020   Dysuria 04/21/2020   Mild persistent asthma 04/21/2020   Encounter for screening mammogram for malignant neoplasm of breast 04/21/2020   Encounter for screening for malignant neoplasm of cervix 04/21/2020    Allergies: No Known Allergies Medications:  Current Outpatient Medications:  sulfamethoxazole-trimethoprim (BACTRIM DS) 800-160 MG tablet, Take 1 tablet by mouth 2 (two) times daily., Disp: 10 tablet, Rfl: 0   dicyclomine (BENTYL) 10 MG capsule, Take 1 capsule (10 mg total) by mouth 3 (three) times daily before meals., Disp: 15 capsule, Rfl: 0   omeprazole (PRILOSEC) 20 MG capsule, Take 1 capsule (20 mg total) by mouth daily., Disp: 30 capsule, Rfl: 3   ondansetron (ZOFRAN-ODT) 4 MG  disintegrating tablet, Take 1 tablet (4 mg total) by mouth every 8 (eight) hours as needed for nausea or vomiting., Disp: 20 tablet, Rfl: 0  Observations/Objective: Patient is well-developed, well-nourished in no acute distress.  Resting comfortably at home.  Head is normocephalic, atraumatic.  No labored breathing.  Speech is clear and coherent with logical content.  Patient is alert and oriented at baseline.    Assessment and Plan: 1. Suspected UTI - sulfamethoxazole-trimethoprim (BACTRIM DS) 800-160 MG tablet; Take 1 tablet by mouth 2 (two) times daily.  Dispense: 10 tablet; Refill: 0  - Worsening symptoms. - Will treat empirically with Bactrim  - May continue AZO for spasm.  - Continue to push fluids.  - She is to seek in person evaluation for urine culture if symptoms do not improve or if they worsen.    Follow Up Instructions: I discussed the assessment and treatment plan with the patient. The patient was provided an opportunity to ask questions and all were answered. The patient agreed with the plan and demonstrated an understanding of the instructions.  A copy of instructions were sent to the patient via MyChart unless otherwise noted below.   The patient was advised to call back or seek an in-person evaluation if the symptoms worsen or if the condition fails to improve as anticipated.  Time:  I spent 11 minutes with the patient via telehealth technology discussing the above problems/concerns.    Mar Daring, PA-C

## 2021-12-13 ENCOUNTER — Telehealth: Payer: Medicare HMO | Admitting: Physician Assistant

## 2021-12-13 DIAGNOSIS — K047 Periapical abscess without sinus: Secondary | ICD-10-CM | POA: Diagnosis not present

## 2021-12-13 DIAGNOSIS — K0889 Other specified disorders of teeth and supporting structures: Secondary | ICD-10-CM

## 2021-12-13 MED ORDER — IBUPROFEN 800 MG PO TABS: 800.0000 mg | ORAL_TABLET | Freq: Three times a day (TID) | ORAL | 0 refills | Status: DC | PRN

## 2021-12-13 MED ORDER — AMOXICILLIN 500 MG PO CAPS: 500.0000 mg | ORAL_CAPSULE | Freq: Three times a day (TID) | ORAL | 0 refills | Status: AC

## 2021-12-13 NOTE — Progress Notes (Signed)
?Virtual Visit Consent  ? ?Katherine [name], you are scheduled for a virtual visit with a [facility] provider today.   ?  ?Just as with appointments in the office, your consent must be obtained to participate.  Your consent will be active for this visit and any virtual visit you may have with one of our providers in the next 365 days.   ?  ?If you have a MyChart account, a copy of this consent can be sent to you electronically.  All virtual visits are billed to your insurance company just like a traditional visit in the office.   ? ?As this is a virtual visit, video technology does not allow for your provider to perform a traditional examination.  This may limit your provider's ability to fully assess your condition.  If your provider identifies any concerns that need to be evaluated in person or the need to arrange testing (such as labs, EKG, etc.), we will make arrangements to do so.   ?  ?Although advances in technology are sophisticated, we cannot ensure that it will always work on either your end or our end.  If the connection with a video visit is poor, the visit may have to be switched to a telephone visit.  With either a video or telephone visit, we are not always able to ensure that we have a secure connection.    ? ?I need to obtain your verbal consent now.   Are you willing to proceed with your visit today?  ?  ?Katherine [name] has provided verbal consent on 12/13/2021 for a virtual visit (video or telephone). ?  ?Mar Daring, PA-C  ? ?Date: 12/13/2021 6:45 PM ? ? ?Virtual Visit via Video Note  ? ?Katherine [name], connected with  Katherine [name]  ([id number], Oct 30, 1965) on 12/13/21 at  6:45 PM EDT by a video-enabled telemedicine application and verified that I am speaking with the correct person using two identifiers. ? ?Location: ?Patient: Virtual Visit Location Patient: Home ?Provider: Virtual Visit Location Provider: Home Office ?  ?I discussed the limitations of evaluation and management by  telemedicine and the availability of in person appointments. The patient expressed understanding and agreed to proceed.   ? ?History of Present Illness: ?Katherine [name] is a 56 y.o. who identifies as a female who was assigned female at birth, and is being seen today for dental pain. ? ?HPI: Dental Pain  ?This is a new problem. The current episode started in the past 7 days (Friday, old filling fell out). The problem occurs constantly. The problem has been gradually worsening. The pain is moderate. Associated symptoms include facial pain and thermal sensitivity. She has tried acetaminophen, NSAIDs and rest for the symptoms. The treatment provided no relief.   ? ? ?Problems:  ?Patient Active Problem List  ? Diagnosis Date Noted  ? Acute gastritis without hemorrhage 11/09/2021  ? Essential hypertension 04/21/2020  ? Dysuria 04/21/2020  ? Mild persistent asthma 04/21/2020  ? Encounter for screening mammogram for malignant neoplasm of breast 04/21/2020  ? Encounter for screening for malignant neoplasm of cervix 04/21/2020  ?  ?Allergies: No Known Allergies ?Medications:  ?Current Outpatient Medications:  ?  amoxicillin (AMOXIL) 500 MG capsule, Take 1 capsule (500 mg total) by mouth 3 (three) times daily for 10 days., Disp: 30 capsule, Rfl: 0 ?  ibuprofen (ADVIL) 800 MG tablet, Take 1 tablet (800 mg total) by mouth every 8 (eight) hours as needed., Disp: 30 tablet, Rfl: 0 ?  dicyclomine (  BENTYL) 10 MG capsule, Take 1 capsule (10 mg total) by mouth 3 (three) times daily before meals., Disp: 15 capsule, Rfl: 0 ?  omeprazole (PRILOSEC) 20 MG capsule, Take 1 capsule (20 mg total) by mouth daily., Disp: 30 capsule, Rfl: 3 ?  ondansetron (ZOFRAN-ODT) 4 MG disintegrating tablet, Take 1 tablet (4 mg total) by mouth every 8 (eight) hours as needed for nausea or vomiting., Disp: 20 tablet, Rfl: 0 ?  sulfamethoxazole-trimethoprim (BACTRIM DS) 800-160 MG tablet, Take 1 tablet by mouth 2 (two) times daily., Disp: 10 tablet, Rfl:  0 ? ?Observations/Objective: ?Patient is well-developed, well-nourished in no acute distress.  ?Resting comfortably at home.  ?Head is normocephalic, atraumatic.  ?No labored breathing.  ?Speech is clear and coherent with logical content.  ?Patient is alert and oriented at baseline.  ? ? ?Assessment and Plan: ?1. Dental infection ?- amoxicillin (AMOXIL) 500 MG capsule; Take 1 capsule (500 mg total) by mouth 3 (three) times daily for 10 days.  Dispense: 30 capsule; Refill: 0 ? ?2. Pain, dental ?- amoxicillin (AMOXIL) 500 MG capsule; Take 1 capsule (500 mg total) by mouth 3 (three) times daily for 10 days.  Dispense: 30 capsule; Refill: 0 ?- ibuprofen (ADVIL) 800 MG tablet; Take 1 tablet (800 mg total) by mouth every 8 (eight) hours as needed.  Dispense: 30 tablet; Refill: 0 ? ?- Amoxicillin for infection ?- Ibuprofen for pain, may use tylenol between doses ?- Ice pack to outside cheek for swelling ?- Luke warm liquids and foods; may require soft foods ?- Keep scheduled follow up with dentist ?- Seek in person evaluation if symptoms worsen in the meantime ? ?Follow Up Instructions: ?I discussed the assessment and treatment plan with the patient. The patient was provided an opportunity to ask questions and all were answered. The patient agreed with the plan and demonstrated an understanding of the instructions.  A copy of instructions were sent to the patient via MyChart unless otherwise noted below.  ? ?The patient was advised to call back or seek an in-person evaluation if the symptoms worsen or if the condition fails to improve as anticipated. ? ?Time:  ?I spent 11 minutes with the patient via telehealth technology discussing the above problems/concerns.   ? ?Mar Daring, PA-C ?

## 2021-12-13 NOTE — Patient Instructions (Signed)
?[name], thank you for joining Mar Daring, PA-C for today's virtual visit.  While this provider is not your primary care provider (PCP), if your PCP is located in our provider database this encounter information will be shared with them immediately following your visit. ? ?Consent: ?(Patient) Katherine [name] provided verbal consent for this virtual visit at the beginning of the encounter. ? ?Current Medications: ? ?Current Outpatient Medications:  ?  amoxicillin (AMOXIL) 500 MG capsule, Take 1 capsule (500 mg total) by mouth 3 (three) times daily for 10 days., Disp: 30 capsule, Rfl: 0 ?  ibuprofen (ADVIL) 800 MG tablet, Take 1 tablet (800 mg total) by mouth every 8 (eight) hours as needed., Disp: 30 tablet, Rfl: 0 ?  dicyclomine (BENTYL) 10 MG capsule, Take 1 capsule (10 mg total) by mouth 3 (three) times daily before meals., Disp: 15 capsule, Rfl: 0 ?  omeprazole (PRILOSEC) 20 MG capsule, Take 1 capsule (20 mg total) by mouth daily., Disp: 30 capsule, Rfl: 3 ?  ondansetron (ZOFRAN-ODT) 4 MG disintegrating tablet, Take 1 tablet (4 mg total) by mouth every 8 (eight) hours as needed for nausea or vomiting., Disp: 20 tablet, Rfl: 0 ?  sulfamethoxazole-trimethoprim (BACTRIM DS) 800-160 MG tablet, Take 1 tablet by mouth 2 (two) times daily., Disp: 10 tablet, Rfl: 0  ? ?Medications ordered in this encounter:  ?Meds ordered this encounter  ?Medications  ? amoxicillin (AMOXIL) 500 MG capsule  ?  Sig: Take 1 capsule (500 mg total) by mouth 3 (three) times daily for 10 days.  ?  Dispense:  30 capsule  ?  Refill:  0  ?  Order Specific Question:   Supervising Provider  ?  Answer:   Noemi Chapel [[id number]]  ? ibuprofen (ADVIL) 800 MG tablet  ?  Sig: Take 1 tablet (800 mg total) by mouth every 8 (eight) hours as needed.  ?  Dispense:  30 tablet  ?  Refill:  0  ?  Order Specific Question:   Supervising Provider  ?  Answer:   Noemi Chapel [[id number]]  ?  ? ?*If you need refills on other medications prior to your next  appointment, please contact your pharmacy* ? ?Follow-Up: ?Call back or seek an in-person evaluation if the symptoms worsen or if the condition fails to improve as anticipated. ? ?Other Instructions ?Dental Abscess ?A dental abscess is an area of pus in or around a tooth. It comes from an infection. It can cause pain and other symptoms. Treatment will help with symptoms and prevent the infection from spreading. ?What are the causes? ?This condition is caused by an infection in or around the tooth. This can be from: ?Very bad tooth decay (cavities). ?A bad injury to the tooth, such as a broken or chipped tooth. ?What increases the risk? ?The risk to get an abscess is higher in males. It is also more likely in people who: ?Have dental decay. ?Have very bad gum disease. ?Eat sugary snacks between meals. ?Use tobacco. ?Have diabetes. ?Have a weak disease-fighting system (immune system). ?Do not brush their teeth regularly. ?What are the signs or symptoms? ?Some mild symptoms are: ?Tenderness. ?Bad breath. ?Fever. ?A sharp, sour taste in the mouth. ?Pain in and around the infected tooth. ?Worse symptoms of this condition include: ?Swollen neck glands. ?Chills. ?Pus draining around the tooth. ?Swelling and redness around the tooth, the mouth, or the face. ?Very bad pain in and around the tooth. ?The worst symptoms can include: ?Difficulty swallowing. ?Difficulty opening your  mouth. ?Feeling like you may vomit or vomiting. ?How is this treated? ?This is treated by getting rid of the infection. Your dentist will discuss ways to do this, including: ?Antibiotic medicines. ?Antibacterial mouth rinse. ?An incision in the abscess to drain out the pus. ?A root canal. ?Removing the tooth. ?Follow these instructions at home: ?Medicines ?Take over-the-counter and prescription medicines only as told by your dentist. ?If you were prescribed an antibiotic medicine, take it as told by your dentist. Do not stop taking it even if you start  to feel better. ?If you were prescribed a gel that has numbing medicine in it, use it exactly as told. ?Ask your dentist if you should avoid driving or using machines while you are taking your medicine. ?General instructions ?Rinse your mouth often with salt water. To make salt water, dissolve ?-1 tsp (3-6 g) of salt in 1 cup (237 mL) of warm water. ?Eat a soft diet while your mouth is healing. ?Drink enough fluid to keep your pee (urine) pale yellow. ?Do not apply heat to the outside of your mouth. ?Do not smoke or use any products that contain nicotine or tobacco. If you need help quitting, ask your dentist. ?Keep all follow-up visits. ?Prevent an abscess ?Brush your teeth every morning and every night. Use fluoride toothpaste. ?Floss your teeth each day. ?Get dental cleanings as often as told by your dentist. ?Think about getting dental sealant put on teeth that have deep holes (decay). ?Drink water that has fluoride in it. ?Most tap water has fluoride. ?Check the label on bottled water to see if it has fluoride in it. ?Drink water instead of sugary drinks. ?Eat healthy meals and snacks. ?Wear a mouth guard or face shield when you play sports. ?Contact a doctor if: ?Your pain is worse and medicine does not help. ?Get help right away if: ?You have a fever or chills. ?Your symptoms suddenly get worse. ?You have a very bad headache. ?You have problems breathing or swallowing. ?You have trouble opening your mouth. ?You have swelling in your neck or close to your eye. ?These symptoms may be an emergency. Get help right away. Call your local emergency services (911 in the [place] ?Do not wait to see if the symptoms will go away. ?Do not drive yourself to the hospital. ?Summary ?A dental abscess is an area of pus in or around a tooth. It is caused by an infection. ?Treatment will help with symptoms and prevent the infection from spreading. ?Take over-the-counter and prescription medicines only as told by your  dentist. ?To prevent an abscess, take good care of your teeth. Brush your teeth every morning and night. Use floss every day. ?Get dental cleanings as often as told by your dentist. ?This information is not intended to replace advice given to you by your health care provider. Make sure you discuss any questions you have with your health care provider. ?Document Revised: 11/26/2020 Document Reviewed: 11/26/2020 ?Elsevier Patient Education ? 2022 [place]. ? ? ? ?If you have been instructed to have an in-person evaluation today at a local Urgent Care facility, please use the link below. It will take you to a list of all of our available [facility], including address, phone number and hours of operation. Please do not delay care.  ?[facility] ? ?If you or a family member do not have a primary care provider, use the link below to schedule a visit and establish care. When you choose a [facility] primary care physician or advanced practice provider, you gain a long-term partner in health. ?Find a Primary Care Provider ? ?Learn more about [facility] in-office and virtual care options: ?[facility] ?

## 2021-12-25 ENCOUNTER — Telehealth: Payer: Medicare HMO | Admitting: Family Medicine

## 2021-12-25 DIAGNOSIS — L509 Urticaria, unspecified: Secondary | ICD-10-CM | POA: Diagnosis not present

## 2021-12-25 DIAGNOSIS — T360X5A Adverse effect of penicillins, initial encounter: Secondary | ICD-10-CM

## 2021-12-25 MED ORDER — PREDNISONE 20 MG PO TABS: 40.0000 mg | ORAL_TABLET | Freq: Every day | ORAL | 0 refills | Status: AC

## 2021-12-25 NOTE — Patient Instructions (Signed)
Hives ?Hives (urticaria) are itchy, red, swollen areas on the skin. Hives can appear on any part of the body. Hives often fade within 24 hours (acute hives). Sometimes, new hives appear after old ones fade and the cycle can continue for several days or weeks (chronic hives). Hives do not spread from person to person (are not contagious). ?Hives come from the body's reaction to something a person is allergic to (allergen), something that causes irritation, or various other triggers. When a person is exposed to a trigger, his or her body releases a chemical (histamine) that causes redness, itching, and swelling. Hives can appear right after exposure to a trigger or hours later. ?What are the causes? ?This condition may be caused by: ?Allergies to foods or ingredients. ?Insect bites or stings. ?Exposure to pollen or pets. ?Spending time in sunlight, heat, or cold (exposure). ?Exercise. ?Stress. ?You can also get hives from other medical conditions and treatments, such as: ?Viruses, including the common cold. ?Bacterial infections, such as urinary tract infections and strep throat. ?Certain medicines. ?Contact with latex or chemicals. ?Allergy shots. ?Blood transfusions. ?Sometimes, the cause of this condition is not known (idiopathic hives). ?What increases the risk? ?You are more likely to develop this condition if you: ?Are a woman. ?Have food allergies, especially to citrus fruits, milk, eggs, peanuts, tree nuts, or shellfish. ?Are allergic to: ?Medicines. ?Latex. ?Insects. ?Animals. ?Pollen. ?What are the signs or symptoms? ?Common symptoms of this condition include raised, itchy, red or white bumps or patches on your skin. These areas may: ?Become large and swollen (welts). ?Change in shape and location, quickly and repeatedly. ?Be separate hives or connect over a large area of skin. ?Sting or become painful. ?Turn white when pressed in the center (blanch). ?In severe cases, your hands, feet, and face may also  become swollen. This may occur if hives develop deeper in your skin. ?How is this diagnosed? ?This condition may be diagnosed by your symptoms, medical history, and physical exam. ?Your skin, urine, or blood may be tested to find out what is causing your hives and to rule out other health issues. ?Your health care provider may also remove a small sample of skin from the affected area and examine it under a microscope (biopsy). ?How is this treated? ?Treatment for this condition depends on the cause and severity of your symptoms. Your health care provider may recommend using cool, wet cloths (cool compresses) or taking cool showers to relieve itching. Treatment may include: ?Medicines that help: ?Relieve itching (antihistamines). ?Reduce swelling (corticosteroids). ?Treat infection (antibiotics). ?An injectable medicine (omalizumab). Your health care provider may prescribe this if you have chronic idiopathic hives and you continue to have symptoms even after treatment with antihistamines. ?Severe cases may require an emergency injection of adrenaline (epinephrine) to prevent a life-threatening allergic reaction (anaphylaxis). ?Follow these instructions at home: ?Medicines ?Take and apply over-the-counter and prescription medicines only as told by your health care provider. ?If you were prescribed an antibiotic medicine, take it as told by your health care provider. Do not stop using the antibiotic even if you start to feel better. ?Skin care ?Apply cool compresses to the affected areas. ?Do not scratch or rub your skin. ?General instructions ?Do not take hot showers or baths. This can make itching worse. ?Do not wear tight-fitting clothing. ?Use sunscreen and wear protective clothing when you are outside. ?Avoid any substances that cause your hives. Keep a journal to help track what causes your hives. Write down: ?What medicines you take. ?  What you eat and drink. ?What products you use on your skin. ?Keep all  follow-up visits as told by your health care provider. This is important. ?Contact a health care provider if: ?Your symptoms are not controlled with medicine. ?Your joints are painful or swollen. ?Get help right away if: ?You have a fever. ?You have pain in your abdomen. ?Your tongue or lips are swollen. ?Your eyelids are swollen. ?Your chest or throat feels tight. ?You have trouble breathing or swallowing. ?These symptoms may represent a serious problem that is an emergency. Do not wait to see if the symptoms will go away. Get medical help right away. Call your local emergency services (911 in the [place] Do not drive yourself to the hospital. ?Summary ?Hives (urticaria) are itchy, red, swollen areas on your skin. Hives come from the body's reaction to something a person is allergic to (allergen), something that causes irritation, or various other triggers. ?Treatment for this condition depends on the cause and severity of your symptoms. ?Avoid any substances that cause your hives. Keep a journal to help track what causes your hives. ?Take and apply over-the-counter and prescription medicines only as told by your health care provider. ?Get help right away if your chest or throat feels tight or if you have trouble breathing or swallowing. ?This information is not intended to replace advice given to you by your health care provider. Make sure you discuss any questions you have with your health care provider. ?Document Revised: 11/08/2020 Document Reviewed: 11/08/2020 ?Elsevier Patient Education ? [place]. ? ?

## 2021-12-25 NOTE — Progress Notes (Signed)
Erroneous encounter. She signed up for evist and meant to sign up for video. Video visit done.  ?

## 2021-12-25 NOTE — Progress Notes (Signed)
?Virtual Visit Consent  ? ?Katherine [name], you are scheduled for a virtual visit with a [facility] provider today.   ?  ?Just as with appointments in the office, your consent must be obtained to participate.  Your consent will be active for this visit and any virtual visit you may have with one of our providers in the next 365 days.   ?  ?If you have a MyChart account, a copy of this consent can be sent to you electronically.  All virtual visits are billed to your insurance company just like a traditional visit in the office.   ? ?As this is a virtual visit, video technology does not allow for your provider to perform a traditional examination.  This may limit your provider's ability to fully assess your condition.  If your provider identifies any concerns that need to be evaluated in person or the need to arrange testing (such as labs, EKG, etc.), we will make arrangements to do so.   ?  ?Although advances in technology are sophisticated, we cannot ensure that it will always work on either your end or our end.  If the connection with a video visit is poor, the visit may have to be switched to a telephone visit.  With either a video or telephone visit, we are not always able to ensure that we have a secure connection.    ? ?I need to obtain your verbal consent now.   Are you willing to proceed with your visit today?  ?  ?Katherine [name] has provided verbal consent on 12/25/2021 for a virtual visit (video or telephone). ?  ?Dellia Nims, FNP  ? ?Date: 12/25/2021 5:50 PM ? ? ?Virtual Visit via Video Note  ? ?IDellia Nims, connected with  Katherine [name]  ([id number], 06/09/1966) on 12/25/21 at  6:00 PM EDT by a video-enabled telemedicine application and verified that I am speaking with the correct person using two identifiers. ? ?Location: ?Patient: Virtual Visit Location Patient: Home ?Provider: Virtual Visit Location Provider: Home Office ?  ?I discussed the limitations of evaluation and management by telemedicine and the  availability of in person appointments. The patient expressed understanding and agreed to proceed.   ? ?History of Present Illness: ?Katherine [name] is a 56 y.o. who identifies as a female who was assigned female at birth, and is being seen today for an allergic reaction to amoxil for a dental problem. She says she broke out in hives yesterday and they are persistent. She has been taking childrens benadryl. She has taken prednisone in the past without problems. She denies difficulty swallowing and throat swelling. She denies SOB and chest pain. She is in no distress other than itching on video call.  ? ?HPI: HPI  ?Problems:  ?Patient Active Problem List  ? Diagnosis Date Noted  ? Acute gastritis without hemorrhage 11/09/2021  ? Essential hypertension 04/21/2020  ? Dysuria 04/21/2020  ? Mild persistent asthma 04/21/2020  ? Encounter for screening mammogram for malignant neoplasm of breast 04/21/2020  ? Encounter for screening for malignant neoplasm of cervix 04/21/2020  ?  ?Allergies: No Known Allergies ?Medications:  ?Current Outpatient Medications:  ?  dicyclomine (BENTYL) 10 MG capsule, Take 1 capsule (10 mg total) by mouth 3 (three) times daily before meals., Disp: 15 capsule, Rfl: 0 ?  ibuprofen (ADVIL) 800 MG tablet, Take 1 tablet (800 mg total) by mouth every 8 (eight) hours as needed., Disp: 30 tablet, Rfl: 0 ?  omeprazole (PRILOSEC) 20 MG capsule, Take  1 capsule (20 mg total) by mouth daily., Disp: 30 capsule, Rfl: 3 ?  ondansetron (ZOFRAN-ODT) 4 MG disintegrating tablet, Take 1 tablet (4 mg total) by mouth every 8 (eight) hours as needed for nausea or vomiting., Disp: 20 tablet, Rfl: 0 ?  sulfamethoxazole-trimethoprim (BACTRIM DS) 800-160 MG tablet, Take 1 tablet by mouth 2 (two) times daily., Disp: 10 tablet, Rfl: 0 ? ?Observations/Objective: ?Patient is well-developed, well-nourished in no acute distress.  ?Itching at home.  ?Head is normocephalic, atraumatic.  ?No labored breathing.  ?Speech is clear and  coherent with logical content.  ?Patient is alert and oriented at baseline.  ?No difficulty swallowing or talking.  ? ?Assessment and Plan: ?1. Allergic reaction to penicillin, initial encounter ? ?Start prednisone tonight. Call 911 if she has any trouble swallowing or breathing. Use benadryl 25 mg po q 6 hr pnr itching otc. Stop amoxil. Call dentist Monday.  ? ?Follow Up Instructions: ?I discussed the assessment and treatment plan with the patient. The patient was provided an opportunity to ask questions and all were answered. The patient agreed with the plan and demonstrated an understanding of the instructions.  A copy of instructions were sent to the patient via MyChart unless otherwise noted below.  ? ? ? ?The patient was advised to call back or seek an in-person evaluation if the symptoms worsen or if the condition fails to improve as anticipated. ? ?Time:  ?I spent 8 minutes with the patient via telehealth technology discussing the above problems/concerns.   ? ?Dellia Nims, FNP  ?

## 2021-12-27 ENCOUNTER — Encounter: Payer: Self-pay | Admitting: Internal Medicine

## 2021-12-27 ENCOUNTER — Other Ambulatory Visit: Payer: Self-pay

## 2021-12-27 ENCOUNTER — Ambulatory Visit (INDEPENDENT_AMBULATORY_CARE_PROVIDER_SITE_OTHER): Payer: Medicare HMO | Admitting: Internal Medicine

## 2021-12-27 DIAGNOSIS — L509 Urticaria, unspecified: Secondary | ICD-10-CM

## 2021-12-27 NOTE — Progress Notes (Signed)
?  ? ?Virtual Visit via Telephone Note  ? ?This visit type was conducted due to national recommendations for restrictions regarding the COVID-19 Pandemic (e.g. social distancing) in an effort to limit this patient's exposure and mitigate transmission in our community.  Due to her co-morbid illnesses, this patient is at least at moderate risk for complications without adequate follow up.  This format is felt to be most appropriate for this patient at this time.  The patient did not have access to video technology/had technical difficulties with video requiring transitioning to audio format only (telephone).  All issues noted in this document were discussed and addressed.  No physical exam could be performed with this format. ? ?Evaluation Performed:  Follow-up visit ? ?Date:  12/27/2021  ? ?ID:  Katherine [name], DOB 02/08/1966, MRN [id number] ? ?Patient Location: Home ?Provider Location: Office/Clinic ? ?Participants: Patient ?Location of Patient: Home ?Location of Provider: Telehealth ?Consent was obtain for visit to be over via telehealth. ?I verified that I am speaking with the correct person using two identifiers. ? ?PCP:  Lindell Spar, MD  ? ?Chief Complaint: Rash ? ?History of Present Illness:   ? ?Katherine [name] is a 56 y.o. female who has a televisit for c/o generalized rash for the last 3 days.  She did an evisit for it and was given prednisone.  She tried taking it yesterday and had improvement in her rash, but later reappeared at nighttime.  She also has been taking Benadryl for it.  She denies any new chemical exposure except completing amoxicillin a day before her symptoms started.  Denies any lip swelling, dyspnea or wheezing currently. ? ?The patient does not have symptoms concerning for COVID-19 infection (fever, chills, cough, or new shortness of breath).  ? ?Past Medical, Surgical, Social History, Allergies, and Medications have been Reviewed. ? ?Past Medical History:  ?Diagnosis Date  ? Asthma   ?  Phreesia 04/18/2020  ? Depression   ? Phreesia 04/18/2020  ? ?Past Surgical History:  ?Procedure Laterality Date  ? CESAREAN SECTION N/A   ? Phreesia 04/18/2020  ? FRACTURE SURGERY N/A   ? Phreesia 04/18/2020  ? TUBAL LIGATION N/A   ? Phreesia 04/18/2020  ?  ? ?Current Meds  ?Medication Sig  ? dicyclomine (BENTYL) 10 MG capsule Take 1 capsule (10 mg total) by mouth 3 (three) times daily before meals.  ? ibuprofen (ADVIL) 800 MG tablet Take 1 tablet (800 mg total) by mouth every 8 (eight) hours as needed.  ? omeprazole (PRILOSEC) 20 MG capsule Take 1 capsule (20 mg total) by mouth daily.  ? ondansetron (ZOFRAN-ODT) 4 MG disintegrating tablet Take 1 tablet (4 mg total) by mouth every 8 (eight) hours as needed for nausea or vomiting.  ?  ? ?Allergies:   Amoxicillin  ? ?ROS:   ?Please see the history of present illness.    ? ?All other systems reviewed and are negative. ? ? ?Labs/Other Tests and Data Reviewed:   ? ?Recent Labs: ?No results found for requested labs within last 8760 hours.  ? ?Recent Lipid Panel ?No results found for: CHOL, TRIG, HDL, CHOLHDL, LDLCALC, LDLDIRECT ? ?Wt Readings from Last 3 Encounters:  ?11/09/21 139 lb 1.3 oz (63.1 kg)  ?10/01/20 140 lb (63.5 kg)  ?04/21/20 143 lb 1.9 oz (64.9 kg)  ?  ? ?ASSESSMENT & PLAN:   ? ?Urticaria ?Unknown allergen currently, she had already completed amoxicillin course when her symptoms started -usually allergic symptoms to antibiotic would start getting 24  hours of starting antibiotic ?Continue prednisone for now ?Offered Depo-Medrol in the office today ?Continue Benadryl as needed ? ?Time:   ?Today, I have spent 9 minutes reviewing the chart, including problem list, medications, and with the patient with telehealth technology discussing the above problems. ? ? ?Medication Adjustments/Labs and Tests Ordered: ?Current medicines are reviewed at length with the patient today.  Concerns regarding medicines are outlined above.  ? ?Tests Ordered: ?No orders of the  defined types were placed in this encounter. ? ? ?Medication Changes: ?No orders of the defined types were placed in this encounter. ? ? ? ?Note: This dictation was prepared with Dragon dictation along with smaller phrase technology. Similar sounding words can be transcribed inadequately or may not be corrected upon review. Any transcriptional errors that result from this process are unintentional.  ?  ? ? ?Disposition:  Follow up  ?Signed, ?Lindell Spar, MD  ?12/27/2021 11:52 AM    ? ?[facility] ?[facility] ?

## 2022-03-01 ENCOUNTER — Encounter: Payer: Medicare HMO | Admitting: Internal Medicine

## 2022-03-09 ENCOUNTER — Other Ambulatory Visit: Payer: Self-pay | Admitting: Internal Medicine

## 2022-03-09 DIAGNOSIS — K2900 Acute gastritis without bleeding: Secondary | ICD-10-CM

## 2022-03-13 IMAGING — DX DG CHEST 2V
2 series · 2 of 2 positions shown · non-contrast
Comparison: August 16, 2004

CLINICAL DATA: Cough and shortness of breath

EXAM:
CHEST - 2 VIEW

[chest pa]
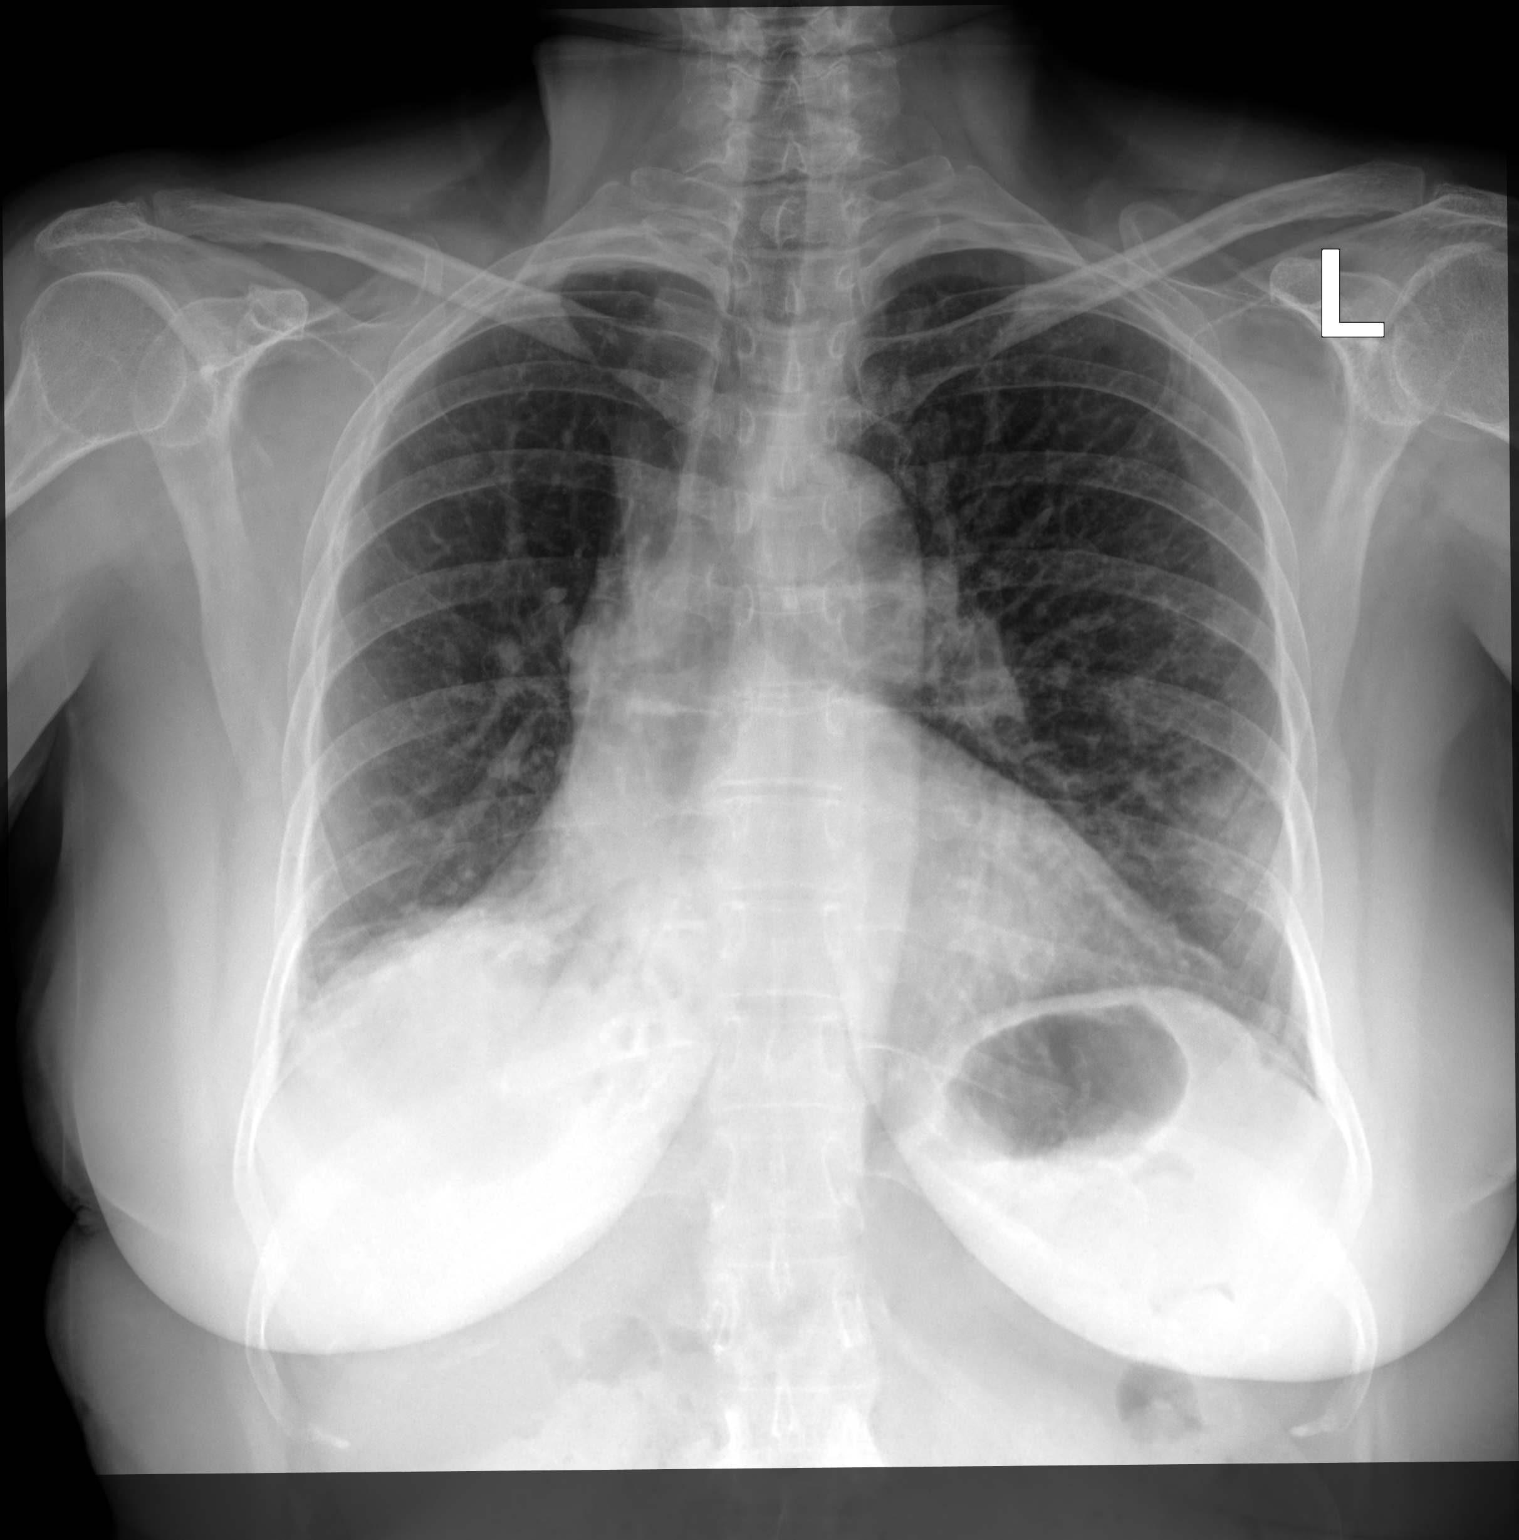

[chest lat]
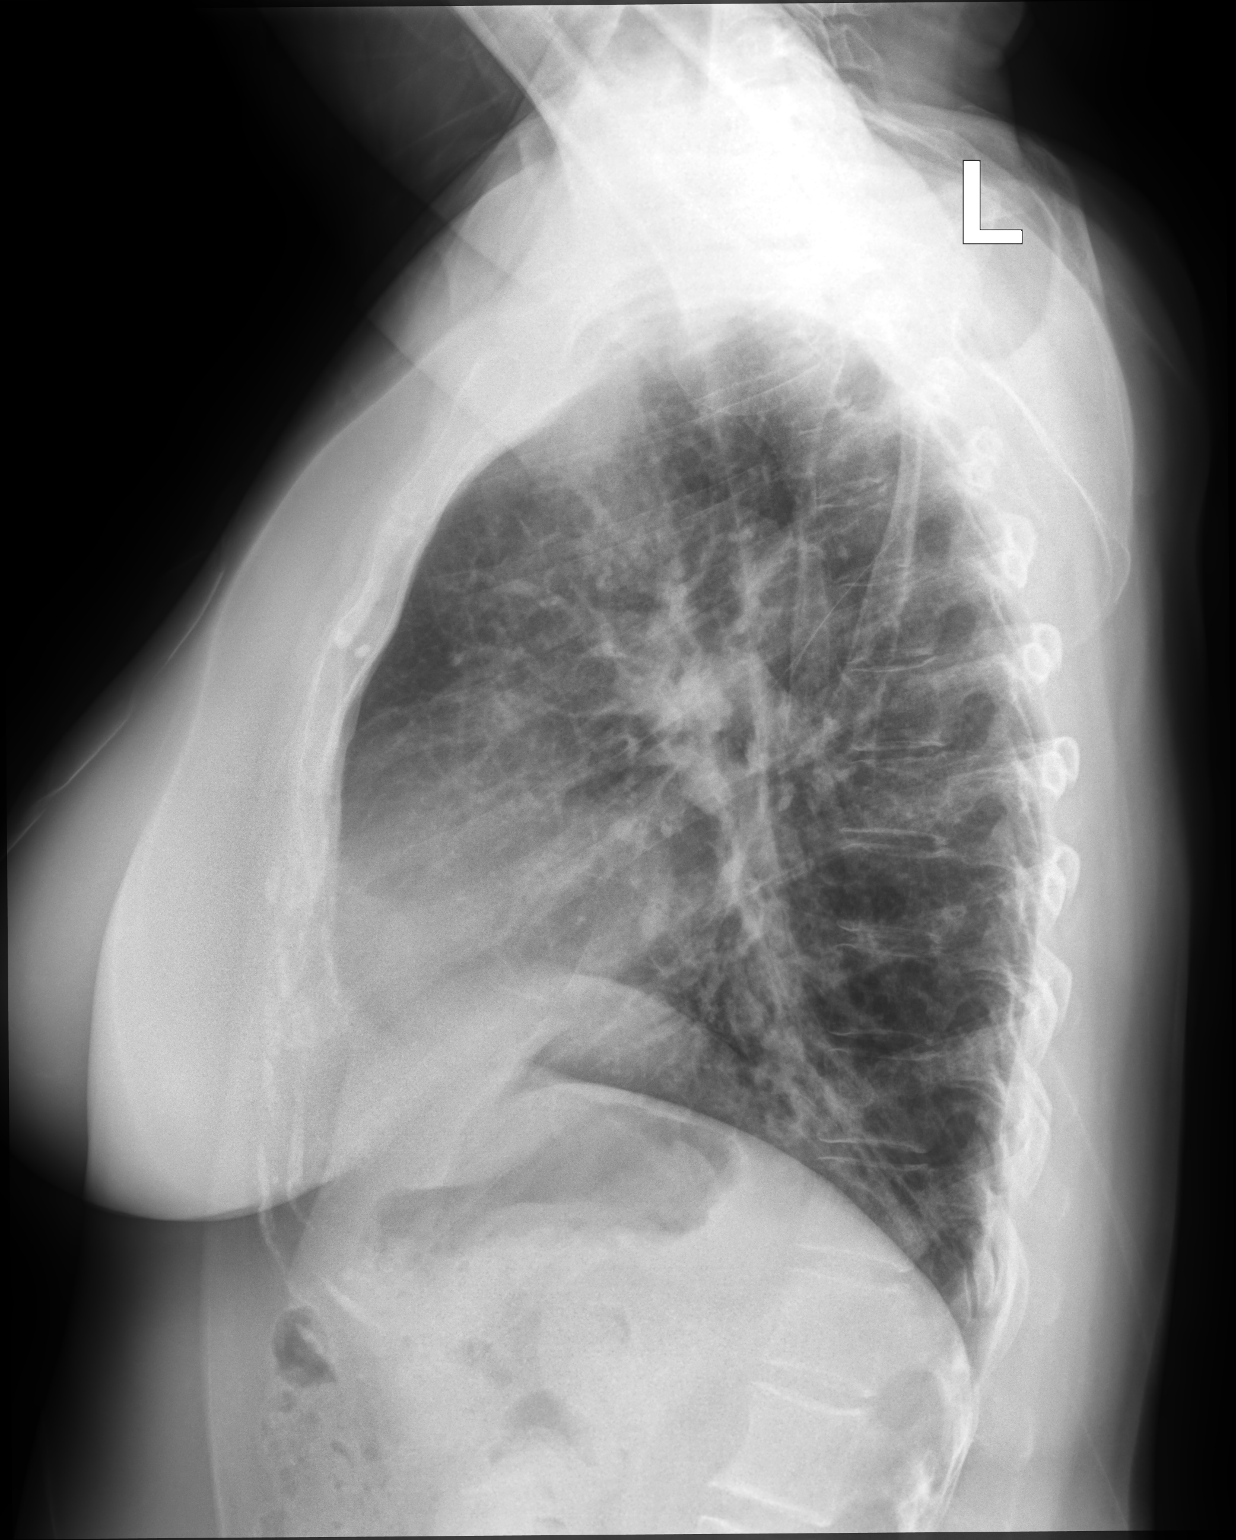

[2 of 2 positions shown; findings below may reference images not displayed]

FINDINGS: There is atelectatic change in the right base. Lungs elsewhere
clear. Heart is upper normal in size with pulmonary vascularity
normal. No adenopathy. No bone lesions.
IMPRESSION: Atelectatic change right base. Developing pneumonia in this area is
questioned. Lungs elsewhere clear. Heart upper normal in size.

These results will be called to the ordering clinician or
representative by the Radiologist Assistant, and communication
documented in the PACS or [REDACTED].

## 2022-04-22 ENCOUNTER — Ambulatory Visit: Payer: Medicare Other

## 2022-04-26 ENCOUNTER — Encounter: Payer: Self-pay | Admitting: Internal Medicine

## 2022-04-26 NOTE — Progress Notes (Signed)
This encounter was created in error - please disregard.

## 2022-04-27 ENCOUNTER — Ambulatory Visit (INDEPENDENT_AMBULATORY_CARE_PROVIDER_SITE_OTHER): Payer: Medicare HMO | Admitting: Internal Medicine

## 2022-04-27 ENCOUNTER — Encounter: Payer: Self-pay | Admitting: Internal Medicine

## 2022-04-27 ENCOUNTER — Encounter: Payer: Self-pay | Admitting: *Deleted

## 2022-04-27 VITALS — BP 136/84 | HR 63 | Resp 18 | Ht 60.0 in | Wt 143.4 lb

## 2022-04-27 DIAGNOSIS — E559 Vitamin D deficiency, unspecified: Secondary | ICD-10-CM | POA: Diagnosis not present

## 2022-04-27 DIAGNOSIS — Z1211 Encounter for screening for malignant neoplasm of colon: Secondary | ICD-10-CM

## 2022-04-27 DIAGNOSIS — F339 Major depressive disorder, recurrent, unspecified: Secondary | ICD-10-CM | POA: Diagnosis not present

## 2022-04-27 DIAGNOSIS — Z114 Encounter for screening for human immunodeficiency virus [HIV]: Secondary | ICD-10-CM | POA: Diagnosis not present

## 2022-04-27 DIAGNOSIS — K219 Gastro-esophageal reflux disease without esophagitis: Secondary | ICD-10-CM | POA: Diagnosis not present

## 2022-04-27 DIAGNOSIS — R7303 Prediabetes: Secondary | ICD-10-CM | POA: Diagnosis not present

## 2022-04-27 DIAGNOSIS — Z1231 Encounter for screening mammogram for malignant neoplasm of breast: Secondary | ICD-10-CM | POA: Diagnosis not present

## 2022-04-27 DIAGNOSIS — Z1159 Encounter for screening for other viral diseases: Secondary | ICD-10-CM

## 2022-04-27 DIAGNOSIS — J4530 Mild persistent asthma, uncomplicated: Secondary | ICD-10-CM

## 2022-04-27 DIAGNOSIS — Z0001 Encounter for general adult medical examination with abnormal findings: Secondary | ICD-10-CM | POA: Insufficient documentation

## 2022-04-27 MED ORDER — SERTRALINE HCL 25 MG PO TABS: 25.0000 mg | ORAL_TABLET | Freq: Every day | ORAL | 3 refills | Status: DC
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
## 2022-04-27 NOTE — Assessment & Plan Note (Signed)
Well-controlled with Omeprazole 

## 2022-04-27 NOTE — Assessment & Plan Note (Signed)
Well-controlled currently Has maintenance inhaler, need details Albuterol PRN

## 2022-04-27 NOTE — Patient Instructions (Signed)
Please start taking Zoloft as prescribed.  Please take Vitamin B12 1000 mcg once daily.  Please continue to follow heart healthy diet and perform moderate exercise/walking at least 150 mins/week.  Please consider getting Shingrix and Tdap vaccine at your local pharmacy.

## 2022-04-27 NOTE — Assessment & Plan Note (Signed)
[place] Office Visit from 04/27/2022 in [facility]  PHQ-9 Total Score 10     Uncontrolled Started Zoloft

## 2022-04-27 NOTE — Assessment & Plan Note (Addendum)

## 2022-04-28 ENCOUNTER — Encounter: Payer: Self-pay | Admitting: Internal Medicine

## 2022-04-28 LAB — CBC WITH DIFFERENTIAL/PLATELET
Basophils Absolute: 0 10*3/uL (ref 0.0–0.2)
Basos: 1 %
EOS (ABSOLUTE): 0.4 10*3/uL (ref 0.0–0.4)
Eos: 7 %
Hematocrit: 41.6 % (ref 34.0–46.6)
Hemoglobin: 13.8 g/dL (ref 11.1–15.9)
Immature Grans (Abs): 0 10*3/uL (ref 0.0–0.1)
Immature Granulocytes: 0 %
Lymphocytes Absolute: 2.2 10*3/uL (ref 0.7–3.1)
Lymphs: 34 %
MCH: 29.7 pg (ref 26.6–33.0)
MCHC: 33.2 g/dL (ref 31.5–35.7)
MCV: 90 fL (ref 79–97)
Monocytes Absolute: 0.6 10*3/uL (ref 0.1–0.9)
Monocytes: 9 %
Neutrophils Absolute: 3.2 10*3/uL (ref 1.4–7.0)
Neutrophils: 49 %
Platelets: 262 10*3/uL (ref 150–450)
RBC: 4.65 x10E6/uL (ref 3.77–5.28)
RDW: 13.3 % (ref 11.7–15.4)
WBC: 6.5 10*3/uL (ref 3.4–10.8)

## 2022-04-28 LAB — CMP14+EGFR
ALT: 24 IU/L (ref 0–32)
AST: 29 IU/L (ref 0–40)
Albumin/Globulin Ratio: 1.8 (ref 1.2–2.2)
Albumin: 4.9 g/dL (ref 3.8–4.9)
Alkaline Phosphatase: 132 IU/L — ABNORMAL HIGH (ref 44–121)
BUN/Creatinine Ratio: 17 (ref 9–23)
BUN: 12 mg/dL (ref 6–24)
Bilirubin Total: 0.4 mg/dL (ref 0.0–1.2)
CO2: 23 mmol/L (ref 20–29)
Calcium: 9.5 mg/dL (ref 8.7–10.2)
Chloride: 103 mmol/L (ref 96–106)
Creatinine, Ser: 0.72 mg/dL (ref 0.57–1.00)
Globulin, Total: 2.7 g/dL (ref 1.5–4.5)
Glucose: 91 mg/dL (ref 70–99)
Potassium: 4.8 mmol/L (ref 3.5–5.2)
Sodium: 141 mmol/L (ref 134–144)
Total Protein: 7.6 g/dL (ref 6.0–8.5)
eGFR: 97 mL/min/{1.73_m2} (ref >59)

## 2022-04-28 LAB — HEPATITIS C ANTIBODY: Hep C Virus Ab: NONREACTIVE

## 2022-04-28 LAB — HEMOGLOBIN A1C
Est. average glucose Bld gHb Est-mCnc: 111 mg/dL
Hgb A1c MFr Bld: 5.5 % (ref 4.8–5.6)

## 2022-04-28 LAB — LIPID PANEL
Chol/HDL Ratio: 3.3 ratio (ref 0.0–4.4)
Cholesterol, Total: 248 mg/dL — ABNORMAL HIGH (ref 100–199)
HDL: 76 mg/dL (ref >39)
LDL Chol Calc (NIH): 147 mg/dL — ABNORMAL HIGH (ref 0–99)
Triglycerides: 145 mg/dL (ref 0–149)
VLDL Cholesterol Cal: 25 mg/dL (ref 5–40)

## 2022-04-28 LAB — HIV ANTIBODY (ROUTINE TESTING W REFLEX): HIV Screen 4th Generation wRfx: NONREACTIVE

## 2022-04-28 LAB — VITAMIN D 25 HYDROXY (VIT D DEFICIENCY, FRACTURES): Vit D, 25-Hydroxy: 24.2 ng/mL — ABNORMAL LOW (ref 30.0–100.0)

## 2022-04-28 LAB — TSH: TSH: 2.35 u[IU]/mL (ref 0.450–4.500)

## 2022-05-09 ENCOUNTER — Ambulatory Visit (HOSPITAL_COMMUNITY)
Admission: RE | Admit: 2022-05-09 | Discharge: 2022-05-09 | Disposition: A | Discharge status: Home / Self Care | Payer: Medicare HMO | Source: Ambulatory Visit | Attending: Internal Medicine | Admitting: Internal Medicine

## 2022-05-09 ENCOUNTER — Ambulatory Visit (INDEPENDENT_AMBULATORY_CARE_PROVIDER_SITE_OTHER): Payer: Medicare HMO

## 2022-05-09 DIAGNOSIS — Z0000 Encounter for general adult medical examination without abnormal findings: Secondary | ICD-10-CM | POA: Diagnosis not present

## 2022-05-09 DIAGNOSIS — Z1231 Encounter for screening mammogram for malignant neoplasm of breast: Secondary | ICD-10-CM | POA: Diagnosis not present

## 2022-05-09 NOTE — Patient Instructions (Signed)
  Ms. Reckart , Thank you for taking time to come for your Medicare Wellness Visit. I appreciate your ongoing commitment to your health goals. Please review the following plan we discussed and let me know if I can assist you in the future.   These are the goals we discussed:  Goals      Patient Stated     Stay on top of my health this year!     Prevent falls     Weight (lb) < 200 lb (90.7 kg)     Pt would like to lose 5 more lbs by the end of the year         Mammogram, colonoscopy, shingrix and tetanus are working on being completed. Please follow through with getting these done.  This is a list of the screening recommended for you and due dates:  Health Maintenance  Topic Date Due   Tetanus Vaccine  Never done   Pap Smear  Never done   Colon Cancer Screening  Never done   Mammogram  Never done   Zoster (Shingles) Vaccine (1 of 2) Never done   Flu Shot  05/03/2022   Hepatitis C Screening: USPSTF Recommendation to screen - Ages 30-79 yo.  Completed   HIV Screening  Completed   HPV Vaccine  Aged Out

## 2022-05-09 NOTE — Progress Notes (Signed)
I connected with  Katherine [name] on 05/09/22 by a audio enabled telemedicine application and verified that I am speaking with the correct person using two identifiers.  Patient Location: Home  Provider Location: Office/Clinic  I discussed the limitations of evaluation and management by telemedicine. The patient expressed understanding and agreed to proceed.  Subjective:   Katherine [name] is a 56 y.o. female who presents for Medicare Annual (Subsequent) preventive examination.  Review of Systems     Cardiac Risk Factors include: hypertension     Objective:    Today's Vitals   05/09/22 0809  PainSc: 0-No pain   There is no height or weight on file to calculate BMI.     04/21/2021    2:06 PM  Advanced Directives  Does Patient Have a Medical Advance Directive? No  Would patient like information on creating a medical advance directive? No - Patient declined    Current Medications (verified) Outpatient Encounter Medications as of 05/09/2022  Medication Sig   albuterol (VENTOLIN HFA) 108 (90 Base) MCG/ACT inhaler Inhale 1 puff into the lungs every 6 (six) hours as needed.   fluticasone-salmeterol (ADVAIR HFA) 230-21 MCG/ACT inhaler Inhale 2 puffs into the lungs 2 (two) times daily.   ibuprofen (ADVIL) 800 MG tablet Take 1 tablet (800 mg total) by mouth every 8 (eight) hours as needed.   omeprazole (PRILOSEC) 20 MG capsule TAKE 1 CAPSULE BY MOUTH EVERY DAY   ondansetron (ZOFRAN-ODT) 4 MG disintegrating tablet Take 1 tablet (4 mg total) by mouth every 8 (eight) hours as needed for nausea or vomiting.   sertraline (ZOLOFT) 25 MG tablet Take 1 tablet (25 mg total) by mouth daily.   No facility-administered encounter medications on file as of 05/09/2022.    Allergies (verified) Amoxicillin   History: Past Medical History:  Diagnosis Date   Anxiety 2010   Asthma    Phreesia 04/18/2020   Depression    Phreesia 04/18/2020   Past Surgical History:  Procedure Laterality Date   CESAREAN  SECTION N/A    Phreesia 04/18/2020   FRACTURE SURGERY N/A    Phreesia 04/18/2020   TUBAL LIGATION N/A    Phreesia 04/18/2020   Family History  Problem Relation Age of Onset   Hypothyroidism Mother    Hypertension Mother    Anxiety disorder Mother    Arthritis Mother    Hypertension Father    Stroke Father    Hypothyroidism Sister    ADD / ADHD Sister    Asthma Sister    ADD / ADHD Sister    ADD / ADHD Sister    ADD / ADHD Brother    ADD / ADHD Brother    Depression Daughter    Depression Daughter    Social History   Socioeconomic History   Marital status: Divorced    Spouse name: Not on file   Number of children: 4   Years of education: Not on file   Highest education level: 10th grade  Occupational History   Not on file  Tobacco Use   Smoking status: Never   Smokeless tobacco: Never  Substance and Sexual Activity   Alcohol use: Not Currently   Drug use: Never   Sexual activity: Not Currently    Partners: Male    Birth control/protection: None  Other Topics Concern   Not on file  Social History Narrative   Lives alone   4 children-one in [place], [place], [place], and [place] in the [place] children 2  Enjoy: spending time with family, children visit often       Diets: eats all food groups    Caffeine: 1 cup coffee twice week   Water: 6-8 cups      Wears seat belt    Does not use phone while driving-handfree   Smoke detectors at home   No weapons in the house    Social Determinants of Health   Financial Resource Strain: Medium Risk (04/25/2022)   Overall Financial Resource Strain (CARDIA)    Difficulty of Paying Living Expenses: Somewhat hard  Food Insecurity: Food Insecurity Present (04/25/2022)   Hunger Vital Sign    Worried About Running Out of Food in the Last Year: Often true    Ran Out of Food in the Last Year: Often true  Transportation Needs: No Transportation Needs (04/25/2022)   PRAPARE - Radiographer, therapeutic (Medical): No    Lack of Transportation (Non-Medical): No  Physical Activity: Inactive (04/25/2022)   Exercise Vital Sign    Days of Exercise per Week: 0 days    Minutes of Exercise per Session: 0 min  Stress: Stress Concern Present (04/25/2022)   [place]    Feeling of Stress : Rather much  Social Connections: Unknown (04/25/2022)   Social Connection and Isolation Panel [NHANES]    Frequency of Communication with Friends and Family: Once a week    Frequency of Social Gatherings with Friends and Family: Once a week    Attends Religious Services: Not on Advertising copywriter or Organizations: No    Attends Archivist Meetings: Never    Marital Status: Divorced    Tobacco Counseling Counseling given: Not Answered   Clinical Intake:  Pre-visit preparation completed: Yes  Pain : No/denies pain Pain Score: 0-No pain     Diabetes: No  How often do you need to have someone help you when you read instructions, pamphlets, or other written materials from your doctor or pharmacy?: (P) 3 - Sometimes  Diabetic?no         Activities of Daily Living    05/09/2022    8:18 AM 05/08/2022    5:37 PM  In your present state of health, do you have any difficulty performing the following activities:  Hearing? 0 0  Vision? 0 1  Difficulty concentrating or making decisions? 0 1  Walking or climbing stairs? 0 1  Dressing or bathing? 0 0  Doing errands, shopping? 0 0  Preparing Food and eating ? N N  Using the Toilet? N N  In the past six months, have you accidently leaked urine? N Y  Do you have problems with loss of bowel control? N N  Managing your Medications? N N  Managing your Finances? N N  Housekeeping or managing your Housekeeping? N N    Patient Care Team: Lindell Spar, MD as PCP - General (Internal Medicine)  Indicate any recent Medical Services you may have received  from other than [facility] providers in the past year (date may be approximate).     Assessment:   This is a routine wellness examination for Katherine [name].  Hearing/Vision screen No results found.  Dietary issues and exercise activities discussed:     Goals Addressed   None    Depression Screen    04/27/2022   11:58 AM 04/27/2022   10:37 AM 12/27/2021   11:38 AM 11/09/2021    1:25  PM 04/21/2021    2:06 PM 04/21/2021    2:03 PM 04/21/2020   10:59 AM  PHQ 2/9 Scores  PHQ - 2 Score 4 0 0 0 0 0 0  PHQ- 9 Score 10      0    Fall Risk    05/09/2022    8:11 AM 05/08/2022    5:37 PM 04/27/2022   10:37 AM 04/25/2022    8:50 PM 12/27/2021   11:38 AM  Fall Risk   Falls in the past year? 0 0 0 0   0 0  Number falls in past yr: 0  0  0  Injury with Fall? 0  0  0  Risk for fall due to :   No Fall Risks  No Fall Risks  Follow up   Falls evaluation completed  Falls evaluation completed    [place]:  Any stairs in or around the home? No  If so, are there any without handrails? No  Home free of loose throw rugs in walkways, pet beds, electrical cords, etc? Yes  Adequate lighting in your home to reduce risk of falls? Yes   ASSISTIVE DEVICES UTILIZED TO PREVENT FALLS:  Life alert? No  Use of a cane, walker or w/c? Yes  cane  Grab bars in the bathroom? Yes  Shower chair or bench in shower? No  Elevated toilet seat or a handicapped toilet? No    Cognitive Function:    04/21/2021    2:07 PM  MMSE - Mini Mental State Exam  Not completed: Unable to complete        05/09/2022    8:11 AM 04/21/2021    2:07 PM  6CIT Screen  What Year? 0 points 0 points  What month? 0 points 0 points  What time? 0 points 0 points  Count back from 20 0 points 0 points  Months in reverse 0 points 0 points  Repeat phrase 0 points 0 points  Total Score 0 points 0 points    Immunizations  There is no immunization history on file for this patient.  TDAP status: Due, Education  has been provided regarding the importance of this vaccine. Advised may receive this vaccine at local pharmacy or Health Dept. Aware to provide a copy of the vaccination record if obtained from local pharmacy or Health Dept. Verbalized acceptance and understanding.  Flu Vaccine status: Up to date  Pneumococcal vaccine status: Up to date  Covid-19 vaccine status: Completed vaccines  Qualifies for Shingles Vaccine? Yes   Zostavax completed No   Shingrix Completed?: No.    Education has been provided regarding the importance of this vaccine. Patient has been advised to call insurance company to determine out of pocket expense if they have not yet received this vaccine. Advised may also receive vaccine at local pharmacy or Health Dept. Verbalized acceptance and understanding. Will get this week  Screening Tests Health Maintenance  Topic Date Due   TETANUS/TDAP  Never done   PAP SMEAR-Modifier  Never done   COLONOSCOPY (Pts 45-71yr Insurance coverage will need to be confirmed)  Never done   MAMMOGRAM  Never done   Zoster Vaccines- Shingrix (1 of 2) Never done   INFLUENZA VACCINE  05/03/2022   Hepatitis C Screening  Completed   HIV Screening  Completed   HPV VACCINES  Aged Out    Health Maintenance  Health Maintenance Due  Topic Date Due   TETANUS/TDAP  Never done  PAP SMEAR-Modifier  Never done   COLONOSCOPY (Pts 45-16yr Insurance coverage will need to be confirmed)  Never done   MAMMOGRAM  Never done   Zoster Vaccines- Shingrix (1 of 2) Never done   INFLUENZA VACCINE  05/03/2022    Colorectal cancer screening: Referral to GI placed has form completed to return . Pt aware the office will call re: appt.  Mammogram status: Ordered scheduled for today . Pt provided with contact info and advised to call to schedule appt.     Lung Cancer Screening: (Low Dose CT Chest recommended if Age 58-80years, 30 pack-year currently smoking OR have quit w/in 15years.) does not qualify.    Lung Cancer Screening Referral:   Additional Screening:  Hepatitis C Screening: does qualify; Completed   Vision Screening: Recommended annual ophthalmology exams for early detection of glaucoma and other disorders of the eye. Is the patient up to date with their annual eye exam?  Yes  Who is the provider or what is the name of the office in which the patient attends annual eye exams?  If pt is not established with a provider, would they like to be referred to a provider to establish care? No .   Dental Screening: Recommended annual dental exams for proper oral hygiene  Community Resource Referral / Chronic Care Management: CRR required this visit?  No   CCM required this visit?  No      Plan:     I have personally reviewed and noted the following in the patient's chart:   Medical and social history Use of alcohol, tobacco or illicit drugs  Current medications and supplements including opioid prescriptions.  Functional ability and status Nutritional status Physical activity Advanced directives List of other physicians Hospitalizations, surgeries, and ER visits in previous 12 months Vitals Screenings to include cognitive, depression, and falls Referrals and appointments  In addition, I have reviewed and discussed with patient certain preventive protocols, quality metrics, and best practice recommendations. A written personalized care plan for preventive services as well as general preventive health recommendations were provided to patient.     BEual Fines LPN   83/10/4968  Nurse Notes:  Katherine [name], Thank you for taking time to come for your Medicare Wellness Visit. I appreciate your ongoing commitment to your health goals. Please review the following plan we discussed and let me know if I can assist you in the future.   These are the goals we discussed:  Goals      Weight (lb) < 200 lb (90.7 kg)     Pt would like to lose 5 more lbs by the end of the year          This is a list of the screening recommended for you and due dates:  Health Maintenance  Topic Date Due   Tetanus Vaccine  Never done   Pap Smear  Never done   Colon Cancer Screening  Never done   Mammogram  Never done   Zoster (Shingles) Vaccine (1 of 2) Never done   Flu Shot  05/03/2022   Hepatitis C Screening: USPSTF Recommendation to screen - Ages 155-79yo.  Completed   HIV Screening  Completed   HPV Vaccine  Aged Out

## 2022-05-11 ENCOUNTER — Other Ambulatory Visit (HOSPITAL_COMMUNITY): Payer: Self-pay | Admitting: Internal Medicine

## 2022-05-11 DIAGNOSIS — R928 Other abnormal and inconclusive findings on diagnostic imaging of breast: Secondary | ICD-10-CM

## 2022-05-17 ENCOUNTER — Ambulatory Visit (HOSPITAL_COMMUNITY)
Admission: RE | Admit: 2022-05-17 | Discharge: 2022-05-17 | Disposition: A | Discharge status: Home / Self Care | Payer: Medicare HMO | Source: Ambulatory Visit | Attending: Internal Medicine | Admitting: Internal Medicine

## 2022-05-17 DIAGNOSIS — R928 Other abnormal and inconclusive findings on diagnostic imaging of breast: Secondary | ICD-10-CM

## 2022-05-18 ENCOUNTER — Encounter: Payer: Self-pay | Admitting: *Deleted

## 2022-05-18 NOTE — Patient Instructions (Addendum)
  Procedure: Colonoscopy  Estimated body mass index is 28.01 kg/m as calculated from the following:   Height as of 04/27/22: 5' (1.524 m).   Weight as of 04/27/22: 143 lb 6.4 oz (65 kg).   Have you had a colonoscopy before?  no  Do you have family history of colon cancer  no  Do you have a family history of polyps? no  Previous colonoscopy with polyps removed? no  Do you have a history colorectal cancer?   no  Are you diabetic?  no  Do you have a prosthetic or mechanical heart valve? no  Do you have a pacemaker/defibrillator?   no  Have you had endocarditis/atrial fibrillation?  no  Do you use supplemental oxygen/CPAP?  no  Have you had joint replacement within the last 12 months?  no  Do you tend to be constipated or have to use laxatives?  no   Do you have history of alcohol use? If yes, how much and how often.  no  Do you have history or are you using drugs? If yes, what do are you  using?  no  Have you ever had a stroke/heart attack?  no  Have you ever had a heart or other vascular stent placed,?no  Do you take weight loss medication? no  female patients,: have you had a hysterectomy? no                              are you post menopausal?  no                              do you still have your menstrual cycle? no    Date of last menstrual period. 12/2011  Do you take any blood-thinning medications such as: (Plavix, aspirin, Coumadin, Aggrenox, Brilinta, Xarelto, Eliquis, Pradaxa, Savaysa or Effient) no  If yes we need the name, milligram, dosage and who is prescribing doctor:               Current Outpatient Medications on File Prior to Visit  Medication Sig Dispense Refill   omeprazole (PRILOSEC) 20 MG capsule TAKE 1 CAPSULE BY MOUTH EVERY DAY 90 capsule 1   sertraline (ZOLOFT) 25 MG tablet Take 1 tablet (25 mg total) by mouth daily. 30 tablet 3   No current facility-administered medications on file prior to visit.      Allergies  Allergen Reactions    Amoxicillin Hives

## 2022-05-19 ENCOUNTER — Other Ambulatory Visit: Payer: Self-pay | Admitting: Internal Medicine

## 2022-05-19 DIAGNOSIS — F339 Major depressive disorder, recurrent, unspecified: Secondary | ICD-10-CM

## 2022-05-23 ENCOUNTER — Encounter: Payer: Self-pay | Admitting: Family Medicine

## 2022-05-23 ENCOUNTER — Other Ambulatory Visit (HOSPITAL_COMMUNITY)
Admission: RE | Admit: 2022-05-23 | Discharge: 2022-05-23 | Disposition: A | Discharge status: Home / Self Care | Payer: Medicare HMO | Source: Ambulatory Visit | Attending: Family Medicine | Admitting: Family Medicine

## 2022-05-23 ENCOUNTER — Ambulatory Visit (INDEPENDENT_AMBULATORY_CARE_PROVIDER_SITE_OTHER): Payer: Medicare HMO | Admitting: Family Medicine

## 2022-05-23 VITALS — BP 140/80 | HR 63 | Ht 60.0 in | Wt 142.1 lb

## 2022-05-23 DIAGNOSIS — Z01419 Encounter for gynecological examination (general) (routine) without abnormal findings: Secondary | ICD-10-CM | POA: Insufficient documentation

## 2022-05-23 DIAGNOSIS — F339 Major depressive disorder, recurrent, unspecified: Secondary | ICD-10-CM | POA: Diagnosis not present

## 2022-05-23 DIAGNOSIS — Z124 Encounter for screening for malignant neoplasm of cervix: Secondary | ICD-10-CM

## 2022-05-23 DIAGNOSIS — Z1151 Encounter for screening for human papillomavirus (HPV): Secondary | ICD-10-CM | POA: Diagnosis not present

## 2022-05-23 NOTE — Progress Notes (Signed)
Established Patient Office Visit  Subjective:  Patient ID: Katherine [name], female    DOB: 1965-10-16  Age: 56 y.o. MRN: [id number]  CC:  Chief Complaint  Patient presents with   Gynecologic Exam    Here for pap, would like her hormone levels checked due to low energy and fatigue.     HPI Katherine [name] is a 56 y.o. female with past medical history of essential hypertension, mild persistent asthma presents for a pap smear.  Depression: She reports feeling sluggish and tired and requested to have her hormone level checked. Pt is postmenopausal. She saw her PCP on 04/27/22, and her labs were checked. TSH was normal, and her Vit D was low. She was started on Zoloft 35m and Vit D and Vit B12 supplements. She reports starting Vit D and Vit. B12 2 weeks ago.   Past Medical History:  Diagnosis Date   Anxiety 2010   Asthma    Phreesia 04/18/2020   Depression    Phreesia 04/18/2020    Past Surgical History:  Procedure Laterality Date   CESAREAN SECTION N/A    Phreesia 04/18/2020   FRACTURE SURGERY N/A    Phreesia 04/18/2020   TUBAL LIGATION N/A    Phreesia 04/18/2020    Family History  Problem Relation Age of Onset   Hypothyroidism Mother    Hypertension Mother    Anxiety disorder Mother    Arthritis Mother    Hypertension Father    Stroke Father    Hypothyroidism Sister    ADD / ADHD Sister    Asthma Sister    ADD / ADHD Sister    ADD / ADHD Sister    ADD / ADHD Brother    ADD / ADHD Brother    Depression Daughter    Depression Daughter     Social History   Socioeconomic History   Marital status: Divorced    Spouse name: Not on file   Number of children: 4   Years of education: Not on file   Highest education level: 10th grade  Occupational History   Not on file  Tobacco Use   Smoking status: Never   Smokeless tobacco: Never  Substance and Sexual Activity   Alcohol use: Not Currently   Drug use: Never   Sexual activity: Not Currently    Partners: Male     Birth control/protection: None  Other Topics Concern   Not on file  Social History Narrative   Lives alone   [place]      Enjoy: spending time with family, children visit often       Diets: eats all food groups    Caffeine: 1 cup coffee twice week   Water: 6-8 cups      Wears seat belt    Does not use phone while driving-handfree   Smoke detectors at home   No weapons in the house    Social Determinants of Health   Financial Resource Strain: Medium Risk (04/25/2022)   Overall Financial Resource Strain (CARDIA)    Difficulty of Paying Living Expenses: Somewhat hard  Food Insecurity: Food Insecurity Present (04/25/2022)   Hunger Vital Sign    Worried About Running Out of Food in the Last Year: Often true    Ran Out of Food in the Last Year: Often true  Transportation Needs: No Transportation Needs (04/25/2022)   PRAPARE - Transportation    Lack  of Transportation (Medical): No    Lack of Transportation (Non-Medical): No  Physical Activity: Inactive (04/25/2022)   Exercise Vital Sign    Days of Exercise per Week: 0 days    Minutes of Exercise per Session: 0 min  Stress: Stress Concern Present (04/25/2022)   [place]    Feeling of Stress : Rather much  Social Connections: Unknown (04/25/2022)   Social Connection and Isolation Panel [NHANES]    Frequency of Communication with Friends and Family: Once a week    Frequency of Social Gatherings with Friends and Family: Once a week    Attends Religious Services: Not on Diplomatic Services operational officer of Clubs or Organizations: No    Attends Archivist Meetings: Never    Marital Status: Divorced  Human resources officer Violence: Not At Risk (04/21/2021)   Humiliation, Afraid, Rape, and Kick questionnaire    Fear of Current or Ex-Partner: No    Emotionally Abused: No    Physically Abused: No     Sexually Abused: No    Outpatient Medications Prior to Visit  Medication Sig Dispense Refill   omeprazole (PRILOSEC) 20 MG capsule TAKE 1 CAPSULE BY MOUTH EVERY DAY 90 capsule 1   sertraline (ZOLOFT) 25 MG tablet Take 1 tablet (25 mg total) by mouth daily. 30 tablet 3   No facility-administered medications prior to visit.    Allergies  Allergen Reactions   Amoxicillin Hives    ROS Review of Systems  Constitutional:  Positive for fatigue. Negative for fever.  Respiratory:  Negative for chest tightness and shortness of breath.   Cardiovascular:  Negative for chest pain and palpitations.  Genitourinary:  Negative for difficulty urinating, dyspareunia, flank pain, hematuria, menstrual problem, pelvic pain, vaginal discharge and vaginal pain.  Neurological:  Negative for facial asymmetry and headaches.  Psychiatric/Behavioral:  Negative for self-injury and suicidal ideas.       Objective:    Physical Exam Cardiovascular:     Rate and Rhythm: Normal rate and regular rhythm.     Pulses: Normal pulses.     Heart sounds: Normal heart sounds.  Pulmonary:     Effort: Pulmonary effort is normal.     Breath sounds: Normal breath sounds.  Genitourinary:    Exam position: Lithotomy position.     Pubic Area: No rash or pubic lice.      Tanner stage (genital): 5.     Labia:        Right: No lesion.        Left: No lesion.      Urethra: No urethral swelling or urethral lesion.     Comments: Vaginal wall: pink and rugated, smooth and non-tender; absence of lesions, edema, and erythema. Labia Majora and Minora: present bilaterally, moist, soft tissue, and homogeneous; free of edema and ulcerations. Clitoris is anatomically present, above the urethral, and free of lesions, masses, and ulceration.    Neurological:     Mental Status: She is alert.     BP (!) 140/80   Pulse 63   Ht 5' (1.524 m)   Wt 142 lb 0.8 oz (64.4 kg)   SpO2 96%   BMI 27.74 kg/m  Wt Readings from Last 3  Encounters:  05/23/22 142 lb 0.8 oz (64.4 kg)  04/27/22 143 lb 6.4 oz (65 kg)  11/09/21 139 lb 1.3 oz (63.1 kg)    Lab Results  Component Value Date   TSH 2.350 04/27/2022  Lab Results  Component Value Date   WBC 6.5 04/27/2022   HGB 13.8 04/27/2022   HCT 41.6 04/27/2022   MCV 90 04/27/2022   PLT 262 04/27/2022   Lab Results  Component Value Date   NA 141 04/27/2022   K 4.8 04/27/2022   CO2 23 04/27/2022   GLUCOSE 91 04/27/2022   BUN 12 04/27/2022   CREATININE 0.72 04/27/2022   BILITOT 0.4 04/27/2022   ALKPHOS 132 (H) 04/27/2022   AST 29 04/27/2022   ALT 24 04/27/2022   PROT 7.6 04/27/2022   ALBUMIN 4.9 04/27/2022   CALCIUM 9.5 04/27/2022   EGFR 97 04/27/2022   Lab Results  Component Value Date   CHOL 248 (H) 04/27/2022   Lab Results  Component Value Date   HDL 76 04/27/2022   Lab Results  Component Value Date   LDLCALC 147 (H) 04/27/2022   Lab Results  Component Value Date   TRIG 145 04/27/2022   Lab Results  Component Value Date   CHOLHDL 3.3 04/27/2022   Lab Results  Component Value Date   HGBA1C 5.5 04/27/2022      Assessment & Plan:   Problem List Items Addressed This Visit       Other   Encounter for screening for malignant neoplasm of cervix - Primary    - According to the [place], cytology and HPV co-testing (preferred) every 5 years or cytology alone (acceptable) every 3 years. - Pap due 2026       Depression, recurrent (HCC)    Pt reports medication effectiveness with Zoloft 25 mg Informed pt that we can titrate up the dose as needed  Informed pt that the medication takes 2-4 weeks for full effect Given that she has recently started Vit D and B12, I encouraged pt to continue therapy Labs are not indicated at the moment since she recently had labs done she reports that she will get repeat labs in November with her PCP      Other Visit Diagnoses     Encounter for Papanicolaou smear for cervical cancer  screening       Relevant Orders   Cytology - PAP       No orders of the defined types were placed in this encounter.   Follow-up: Return if symptoms worsen or fail to improve.    Alvira Monday, FNP

## 2022-05-23 NOTE — Patient Instructions (Signed)
I appreciate the opportunity to provide care to you today!    Follow up:  In November with Dr. Posey Pronto  We will call you with your pap results in a few days   Please continue to a heart-healthy diet and increase your physical activities. Try to exercise for 36mns at least three times a week.      It was a pleasure to see you and I look forward to continuing to work together on your health and well-being. Please do not hesitate to call the office if you need care or have questions about your care.   Have a wonderful day and week. With Gratitude, GAlvira MondayMSN, FNP-BC

## 2022-05-23 NOTE — Assessment & Plan Note (Signed)
Pt reports medication effectiveness with Zoloft 25 mg Informed pt that we can titrate up the dose as needed  Informed pt that the medication takes 2-4 weeks for full effect Given that she has recently started Vit D and B12, I encouraged pt to continue therapy Labs are not indicated at the moment since she recently had labs done she reports that she will get repeat labs in November with her PCP

## 2022-05-23 NOTE — Assessment & Plan Note (Signed)
-   According to the [employer], cytology and HPV co-testing (preferred) every 5 years or cytology alone (acceptable) every 3 years. - Pap due 2026  

## 2022-05-23 NOTE — Progress Notes (Signed)
Pt called in. Having issues with hemorrhoids, itching. Wanting to be seen. Appt scheduled.

## 2022-05-24 ENCOUNTER — Ambulatory Visit: Payer: Medicare HMO | Admitting: Gastroenterology

## 2022-05-24 ENCOUNTER — Encounter: Payer: Self-pay | Admitting: Gastroenterology

## 2022-05-24 ENCOUNTER — Encounter: Payer: Self-pay | Admitting: *Deleted

## 2022-05-24 ENCOUNTER — Telehealth: Payer: Self-pay | Admitting: *Deleted

## 2022-05-24 VITALS — BP 145/84 | HR 74 | Temp 97.8°F | Ht 60.0 in | Wt 143.2 lb

## 2022-05-24 DIAGNOSIS — K649 Unspecified hemorrhoids: Secondary | ICD-10-CM | POA: Diagnosis not present

## 2022-05-24 DIAGNOSIS — Z1211 Encounter for screening for malignant neoplasm of colon: Secondary | ICD-10-CM

## 2022-05-24 DIAGNOSIS — K219 Gastro-esophageal reflux disease without esophagitis: Secondary | ICD-10-CM | POA: Diagnosis not present

## 2022-05-24 MED ORDER — HYDROCORTISONE (PERIANAL) 2.5 % EX CREA: 1.0000 | TOPICAL_CREAM | Freq: Two times a day (BID) | CUTANEOUS | 1 refills | Status: DC

## 2022-05-24 MED ORDER — KETOCONAZOLE 2 % EX CREA: 1.0000 | TOPICAL_CREAM | Freq: Two times a day (BID) | CUTANEOUS | 0 refills | Status: DC | PRN

## 2022-05-24 MED ORDER — PEG 3350-KCL-NA BICARB-NACL 420 G PO SOLR: 4000.0000 mL | Freq: Once | ORAL | 0 refills | Status: AC

## 2022-05-24 NOTE — Patient Instructions (Signed)
We are arranging  a colonoscopy and upper endoscopy with Dr. Abbey Chatters in the near future!  I have sent in ketoconazole cream to use twice a day: this is for yeast at rectum.  Anusol was also sent in if you have any prolapsing tissue or worsening hemorrhoid issues.  We can see you thereafter for hemorrhoid banding!  It was a pleasure to see you today. I want to create trusting relationships with patients to provide genuine, compassionate, and quality care. I value your feedback. If you receive a survey regarding your visit,  I greatly appreciate you taking time to fill this out.   Annitta Needs, PhD, ANP-BC [facility]

## 2022-05-24 NOTE — Progress Notes (Signed)
Gastroenterology Office Note    Referring Provider: Lindell Spar, MD Primary Care Physician:  Lindell Spar, MD  Primary GI: Dr. Abbey Chatters   Chief Complaint   Chief Complaint  Patient presents with   Colonoscopy    Itching in rectal area. Thinks she has hemorrhoids.      History of Present Illness   Katherine [name] is a 56 y.o. female presenting today at the request of Lindell Spar, MD due to need for initial screening colonoscopy. She requested to be seen in the office due to concern for hemorrhoids. No FH colon cancer or polyps.  She notes intermittent itching/bulging at times in rectum.  No rectal bleeding. No abdominal pain. Bristol stool scale #4. History of straining.    In February had belching, reflux, started omeprazole. Improvement in symptoms. She is doing well on omeprazole. She would like an EGD at time of visit.  No other concerns today.   Past Medical History:  Diagnosis Date   Anxiety 2010   Asthma    Phreesia 04/18/2020   Depression    [facility]reesia 04/18/2020   GERD (gastroesophageal reflux disease)     Past Surgical History:  Procedure Laterality Date   CESAREAN SECTION N/A    [facility]ia 04/18/2020   FRACTURE SURGERY N/A    [facility]ia 04/18/2020   KIDNEY SURGERY     TUBAL LIGATION N/A    [facility]ia 04/18/2020    Current Outpatient Medications  Medication Sig Dispense Refill   hydrocortisone (ANUSOL-HC) 2.5 % rectal cream Place 1 Application rectally 2 (two) times daily. For hemorrhoid flares 30 g 1   ketoconazole (NIZORAL) 2 % cream Apply 1 Application topically 2 (two) times daily as needed for irritation. To rectum for itching 30 g 0   omeprazole (PRILOSEC) 20 MG capsule TAKE 1 CAPSULE BY MOUTH EVERY DAY 90 capsule 1   sertraline (ZOLOFT) 25 MG tablet Take 1 tablet (25 mg total) by mouth daily. 30 tablet 3   polyethylene glycol-electrolytes (NULYTELY) 420 g solution Take 4,000 mLs by mouth once for 1 dose. 4000 mL 0   No current  facility-administered medications for this visit.    Allergies as of 05/24/2022 - Review Complete 05/24/2022  Allergen Reaction Noted   Amoxicillin Hives 12/27/2021    Family History  Problem Relation Age of Onset   Hypothyroidism Mother    Hypertension Mother    Anxiety disorder Mother    Arthritis Mother    Hypertension Father    Stroke Father    Hypothyroidism Sister    ADD / ADHD Sister    Asthma Sister    ADD / ADHD Sister    ADD / ADHD Sister    ADD / ADHD Brother    ADD / ADHD Brother    Depression Daughter    Depression Daughter    Colon cancer Neg Hx    Colon polyps Neg Hx     Social History   Socioeconomic History   Marital status: Divorced    Spouse name: Not on file   Number of children: 4   Years of education: Not on file   Highest education level: 10th grade  Occupational History   Not on file  Tobacco Use   Smoking status: Never   Smokeless tobacco: Never  Substance and Sexual Activity   Alcohol use: Not Currently   Drug use: Never   Sexual activity: Not Currently    Partners: Male    Birth control/protection: None  Other Topics  Concern   Not on file  Social History Narrative   Lives alone   4 children-one in [place], [place], [place], and [place] in the [place] children 2      Enjoy: spending time with family, children visit often       Diets: eats all food groups    Caffeine: 1 cup coffee twice week   Water: 6-8 cups      Wears seat belt    Does not use phone while driving-handfree   Smoke detectors at home   No weapons in the house    Social Determinants of Health   Financial Resource Strain: Medium Risk (04/25/2022)   Overall Financial Resource Strain (CARDIA)    Difficulty of Paying Living Expenses: Somewhat hard  Food Insecurity: Food Insecurity Present (04/25/2022)   Hunger Vital Sign    Worried About Running Out of Food in the Last Year: Often true    Ran Out of Food in the Last Year: Often true  Transportation  [name]: No Transportation [name] (04/25/2022)   PRAPARE - Hydrologist (Medical): No    Lack of Transportation (Non-Medical): No  Physical Activity: Inactive (04/25/2022)   Exercise Vital Sign    Days of Exercise per Week: 0 days    Minutes of Exercise per Session: 0 min  Stress: Stress Concern Present (04/25/2022)   [place]    Feeling of Stress : Rather much  Social Connections: Unknown (04/25/2022)   Social Connection and Isolation Panel [NHANES]    Frequency of Communication with Friends and Family: Once a week    Frequency of Social Gatherings with Friends and Family: Once a week    Attends Religious Services: Not on Advertising copywriter or Organizations: No    Attends Archivist Meetings: Never    Marital Status: Divorced  Human resources officer Violence: Not At Risk (04/21/2021)   Humiliation, Afraid, Rape, and Kick questionnaire    Fear of Current or Ex-Partner: No    Emotionally Abused: No    Physically Abused: No    Sexually Abused: No     Review of Systems   Gen: Denies any fever, chills, fatigue, weight loss, lack of appetite.  CV: Denies chest pain, heart palpitations, peripheral edema, syncope.  Resp: Denies shortness of breath at rest or with exertion. Denies wheezing or cough.  GI: Denies dysphagia or odynophagia. Denies jaundice, hematemesis, fecal incontinence. GU : Denies urinary burning, urinary frequency, urinary hesitancy MS: Denies joint pain, muscle weakness, cramps, or limitation of movement.  Derm: Denies rash, itching, dry skin Psych: Denies depression, anxiety, memory loss, and confusion Heme: Denies bruising, bleeding, and enlarged lymph nodes.   Physical Exam   BP (!) 145/84 (BP Location: Left Arm, Patient Position: Sitting, Cuff Size: Normal)   Pulse 74   Temp 97.8 F (36.6 C) (Temporal)   Ht 5' (1.524 m)   Wt 143 lb 3.2 oz (65 kg)    SpO2 98%   BMI 27.97 kg/m  General:   Alert and oriented. Pleasant and cooperative. Well-nourished and well-developed.  Head:  Normocephalic and atraumatic. Eyes:  Without icterus Ears:  Normal auditory acuity. Lungs:  Clear to auscultation bilaterally.  Heart:  S1, S2 present without murmurs appreciated.  Abdomen:  +BS, soft, non-tender and non-distended. No HSM noted. No guarding or rebound. No masses appreciated.  Rectal:  yeast-like rash perianally. No fissures. DRE  without mass Msk:  Symmetrical without gross deformities. Normal posture. Extremities:  Without edema. Neurologic:  Alert and  oriented x4;  grossly normal neurologically. Skin:  Intact without significant lesions or rashes. Psych:  Alert and cooperative. Normal mood and affect.   Assessment   Guliana Weyandt is a 56 y.o. female presenting today at the request of Lindell Spar, MD due to need for initial screening colonoscopy. She requested to be seen in the office due to concern for hemorrhoids. No FH colon cancer or polyps.  Rectal exam completed today in office. Appears to have yeast-like rash around rectum secondary to moisture/soiling r/t likely internal hemorrhoids. She would be a good banding candidate. No alarm signs/symptoms.   GERD: started on omeprazole in February after significant dyspepsia. She is doing well now on PPI daily but interested in an EGD.      PLAN   Proceed with colonoscopy/EGD by Dr. Abbey Chatters  in near future: the risks, benefits, and alternatives have been discussed with the patient in detail. The patient states understanding and desires to proceed.   Ketoconazole and Anusol per rectum sent to pharmacy  Follow-up for banding thereafter     Katherine Needs, PhD, ANP-BC [facility]

## 2022-05-24 NOTE — H&P (View-Only) (Signed)
Gastroenterology Office Note    Referring Provider: Lindell Spar, MD Primary Care Physician:  Lindell Spar, MD  Primary GI: Dr. Abbey Chatters   Chief Complaint   Chief Complaint  Patient presents with   Colonoscopy    Itching in rectal area. Thinks she has hemorrhoids.      History of Present Illness   Katherine [name] is a 56 y.o. female presenting today at the request of Lindell Spar, MD due to need for initial screening colonoscopy. She requested to be seen in the office due to concern for hemorrhoids. No FH colon cancer or polyps.  She notes intermittent itching/bulging at times in rectum.  No rectal bleeding. No abdominal pain. Bristol stool scale #4. History of straining.    In February had belching, reflux, started omeprazole. Improvement in symptoms. She is doing well on omeprazole. She would like an EGD at time of visit.  No other concerns today.   Past Medical History:  Diagnosis Date   Anxiety 2010   Asthma    Phreesia 04/18/2020   Depression    [facility]ia 04/18/2020   GERD (gastroesophageal reflux disease)     Past Surgical History:  Procedure Laterality Date   CESAREAN SECTION N/A    [facility]ia 04/18/2020   FRACTURE SURGERY N/A    [facility]ia 04/18/2020   KIDNEY SURGERY     TUBAL LIGATION N/A    Phreesia 04/18/2020    Current Outpatient Medications  Medication Sig Dispense Refill   hydrocortisone (ANUSOL-HC) 2.5 % rectal cream Place 1 Application rectally 2 (two) times daily. For hemorrhoid flares 30 g 1   ketoconazole (NIZORAL) 2 % cream Apply 1 Application topically 2 (two) times daily as needed for irritation. To rectum for itching 30 g 0   omeprazole (PRILOSEC) 20 MG capsule TAKE 1 CAPSULE BY MOUTH EVERY DAY 90 capsule 1   sertraline (ZOLOFT) 25 MG tablet Take 1 tablet (25 mg total) by mouth daily. 30 tablet 3   polyethylene glycol-electrolytes (NULYTELY) 420 g solution Take 4,000 mLs by mouth once for 1 dose. 4000 mL 0   No current  facility-administered medications for this visit.    Allergies as of 05/24/2022 - Review Complete 05/24/2022  Allergen Reaction Noted   Amoxicillin Hives 12/27/2021    Family History  Problem Relation Age of Onset   Hypothyroidism Mother    Hypertension Mother    Anxiety disorder Mother    Arthritis Mother    Hypertension Father    Stroke Father    Hypothyroidism Sister    ADD / ADHD Sister    Asthma Sister    ADD / ADHD Sister    ADD / ADHD Sister    ADD / ADHD Brother    ADD / ADHD Brother    Depression Daughter    Depression Daughter    Colon cancer Neg Hx    Colon polyps Neg Hx     Social History   Socioeconomic History   Marital status: Divorced    Spouse name: Not on file   Number of children: 4   Years of education: Not on file   Highest education level: 10th grade  Occupational History   Not on file  Tobacco Use   Smoking status: Never   Smokeless tobacco: Never  Substance and Sexual Activity   Alcohol use: Not Currently   Drug use: Never   Sexual activity: Not Currently    Partners: Male    Birth control/protection: None  Other Topics  Concern   Not on file  Social History Narrative   Lives alone   4 children-one in [place], [place], [place], and [place] in the [place] children 2      Enjoy: spending time with family, children visit often       Diets: eats all food groups    Caffeine: 1 cup coffee twice week   Water: 6-8 cups      Wears seat belt    Does not use phone while driving-handfree   Smoke detectors at home   No weapons in the house    Social Determinants of Health   Financial Resource Strain: Medium Risk (04/25/2022)   Overall Financial Resource Strain (CARDIA)    Difficulty of Paying Living Expenses: Somewhat hard  Food Insecurity: Food Insecurity Present (04/25/2022)   Hunger Vital Sign    Worried About Running Out of Food in the Last Year: Often true    Ran Out of Food in the Last Year: Often true  Transportation  Needs: No Transportation Needs (04/25/2022)   PRAPARE - Hydrologist (Medical): No    Lack of Transportation (Non-Medical): No  Physical Activity: Inactive (04/25/2022)   Exercise Vital Sign    Days of Exercise per Week: 0 days    Minutes of Exercise per Session: 0 min  Stress: Stress Concern Present (04/25/2022)   [place]    Feeling of Stress : Rather much  Social Connections: Unknown (04/25/2022)   Social Connection and Isolation Panel [NHANES]    Frequency of Communication with Friends and Family: Once a week    Frequency of Social Gatherings with Friends and Family: Once a week    Attends Religious Services: Not on Advertising copywriter or Organizations: No    Attends Archivist Meetings: Never    Marital Status: Divorced  Human resources officer Violence: Not At Risk (04/21/2021)   Humiliation, Afraid, Rape, and Kick questionnaire    Fear of Current or Ex-Partner: No    Emotionally Abused: No    Physically Abused: No    Sexually Abused: No     Review of Systems   Gen: Denies any fever, chills, fatigue, weight loss, lack of appetite.  CV: Denies chest pain, heart palpitations, peripheral edema, syncope.  Resp: Denies shortness of breath at rest or with exertion. Denies wheezing or cough.  GI: Denies dysphagia or odynophagia. Denies jaundice, hematemesis, fecal incontinence. GU : Denies urinary burning, urinary frequency, urinary hesitancy MS: Denies joint pain, muscle weakness, cramps, or limitation of movement.  Derm: Denies rash, itching, dry skin Psych: Denies depression, anxiety, memory loss, and confusion Heme: Denies bruising, bleeding, and enlarged lymph nodes.   Physical Exam   BP (!) 145/84 (BP Location: Left Arm, Patient Position: Sitting, Cuff Size: Normal)   Pulse 74   Temp 97.8 F (36.6 C) (Temporal)   Ht 5' (1.524 m)   Wt 143 lb 3.2 oz (65 kg)    SpO2 98%   BMI 27.97 kg/m  General:   Alert and oriented. Pleasant and cooperative. Well-nourished and well-developed.  Head:  Normocephalic and atraumatic. Eyes:  Without icterus Ears:  Normal auditory acuity. Lungs:  Clear to auscultation bilaterally.  Heart:  S1, S2 present without murmurs appreciated.  Abdomen:  +BS, soft, non-tender and non-distended. No HSM noted. No guarding or rebound. No masses appreciated.  Rectal:  yeast-like rash perianally. No fissures. DRE  without mass Msk:  Symmetrical without gross deformities. Normal posture. Extremities:  Without edema. Neurologic:  Alert and  oriented x4;  grossly normal neurologically. Skin:  Intact without significant lesions or rashes. Psych:  Alert and cooperative. Normal mood and affect.   Assessment   Katherine [name] is a 56 y.o. female presenting today at the request of Lindell Spar, MD due to need for initial screening colonoscopy. She requested to be seen in the office due to concern for hemorrhoids. No FH colon cancer or polyps.  Rectal exam completed today in office. Appears to have yeast-like rash around rectum secondary to moisture/soiling r/t likely internal hemorrhoids. She would be a good banding candidate. No alarm signs/symptoms.   GERD: started on omeprazole in February after significant dyspepsia. She is doing well now on PPI daily but interested in an EGD.      PLAN   Proceed with colonoscopy/EGD by Dr. Abbey Chatters  in near future: the risks, benefits, and alternatives have been discussed with the patient in detail. The patient states understanding and desires to proceed.   Ketoconazole and Anusol per rectum sent to pharmacy  Follow-up for banding thereafter     Annitta Needs, PhD, ANP-BC [facility]

## 2022-05-24 NOTE — Telephone Encounter (Signed)
PA submitted via cohere. Tracking [id number]

## 2022-05-24 NOTE — Telephone Encounter (Signed)
PA approved. Authorization #[id number], DOS: 06/21/2022 - 08/21/2022

## 2022-05-25 LAB — CYTOLOGY - PAP
Comment: NEGATIVE
Comment: NEGATIVE
Comment: NEGATIVE
HPV 16: POSITIVE — AB
HPV 18 / 45: NEGATIVE
High risk HPV: POSITIVE — AB

## 2022-05-26 ENCOUNTER — Other Ambulatory Visit: Payer: Self-pay | Admitting: Family Medicine

## 2022-05-26 DIAGNOSIS — R87618 Other abnormal cytological findings on specimens from cervix uteri: Secondary | ICD-10-CM

## 2022-05-26 NOTE — Progress Notes (Signed)
Please inform the patient that her pap test was abnormal. I've placed an urgent referral to GYN for a colposcopy

## 2022-06-07 ENCOUNTER — Telehealth: Payer: Self-pay | Admitting: Internal Medicine

## 2022-06-07 ENCOUNTER — Encounter: Payer: Self-pay | Admitting: Internal Medicine

## 2022-06-07 ENCOUNTER — Other Ambulatory Visit: Payer: Self-pay | Admitting: Internal Medicine

## 2022-06-07 DIAGNOSIS — C539 Malignant neoplasm of cervix uteri, unspecified: Secondary | ICD-10-CM

## 2022-06-07 HISTORY — DX: Malignant neoplasm of cervix uteri, unspecified: C53.9

## 2022-06-07 NOTE — Addendum Note (Signed)
Addended byIhor Dow on: 06/07/2022 11:50 AM   Modules accepted: Orders

## 2022-06-07 NOTE — Telephone Encounter (Signed)
Referral sent to oncology

## 2022-06-07 NOTE — Telephone Encounter (Signed)
Patient needs new obgyn referral   Original referral was denied.  Patient wants a call back

## 2022-06-09 ENCOUNTER — Telehealth: Payer: Self-pay | Admitting: *Deleted

## 2022-06-09 NOTE — Telephone Encounter (Signed)
Spoke with the patient regarding the referral to GYN oncology. Patient scheduled as new patient with Dr Berline Lopes on 9/15 at 9:45 am. Patient given an arrival time of 9:15 am.  Explained to the patient the the doctor will perform a pelvic exam at this visit. Patient given the policy that no visitors under the 16 yrs are allowed in the [place]. Patient given the address/phone number for the clinic and that the center offers free valet service.

## 2022-06-12 ENCOUNTER — Telehealth: Payer: Medicare HMO | Admitting: Nurse Practitioner

## 2022-06-12 DIAGNOSIS — R399 Unspecified symptoms and signs involving the genitourinary system: Secondary | ICD-10-CM

## 2022-06-12 MED ORDER — NITROFURANTOIN MONOHYD MACRO 100 MG PO CAPS: 100.0000 mg | ORAL_CAPSULE | Freq: Two times a day (BID) | ORAL | 0 refills | Status: DC

## 2022-06-12 NOTE — Progress Notes (Signed)

## 2022-06-12 NOTE — Progress Notes (Signed)
I have spent at least 5 minutes reviewing and documenting in the patient's chart.  

## 2022-06-15 ENCOUNTER — Encounter (HOSPITAL_COMMUNITY)
Admission: RE | Admit: 2022-06-15 | Discharge: 2022-06-15 | Disposition: A | Discharge status: Home / Self Care | Payer: Medicare HMO | Source: Ambulatory Visit | Attending: Internal Medicine | Admitting: Internal Medicine

## 2022-06-15 ENCOUNTER — Encounter: Payer: Self-pay | Admitting: Gynecologic Oncology

## 2022-06-15 ENCOUNTER — Other Ambulatory Visit: Payer: Self-pay | Admitting: Internal Medicine

## 2022-06-15 ENCOUNTER — Encounter (HOSPITAL_COMMUNITY): Payer: Self-pay

## 2022-06-15 DIAGNOSIS — F339 Major depressive disorder, recurrent, unspecified: Secondary | ICD-10-CM

## 2022-06-16 NOTE — H&P (View-Only) (Signed)
GYNECOLOGIC ONCOLOGY NEW PATIENT CONSULTATION   Patient Name: Katherine [name]  Patient Age: 56 y.o. Date of Service: 05/17/22 Referring Provider: Lindell Spar, MD [address] [place],  [place] [address]   Primary Care Provider: Lindell Spar, MD Consulting Provider: Jeral Pinch, MD   Assessment/Plan:  56 year old with recent Pap smear showing squamous cell carcinoma and findings on colposcopy of high-grade dysplasia.  Reviewed with the patient her recent Pap test results.  We discussed that confirmation is needed by pathology assessment.  Colposcopy was performed today with findings concerning for high-grade dysplasia although no overt findings of malignancy.  Multiple biopsies were taken.  I will call the patient when I get these results.  She understands that despite no obvious cancer on exam today, microscopically, there may be a cervical cancer which led to her Pap test results.  I think very likely she will require a cervical excision procedure.  This would be true in the setting of biopsies that do not show high-grade carcinoma or malignancy given discordancy with the Pap test and biopsy results.  In the setting of high-grade dysplasia, excisional biopsy may provide all the treatment she needs.  In the setting of a biopsy showing cervical squamous cell carcinoma, given her exam, excisional biopsy would be important to determine next step in her management.  We discussed the etiology of the majority of cervical dysplasia and malignancy.  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  She is not on any immunosuppressant medications nor does she have any diseases that would cause immunosuppression.  She denies any history of abnormal Pap smears but has not had one in likely 7+ years.  If biopsies confirm cervical cancer, we will recommend that she get HIV testing.  A copy of this note was sent to the patient's referring provider.   58 minutes of total time was spent for this patient  encounter, including preparation, face-to-face counseling with the patient and coordination of care, and documentation of the encounter.   Jeral Pinch, MD  Division of Gynecologic Oncology  Department of Obstetrics and Gynecology  [facility]  ___________________________________________  Chief Complaint: No chief complaint on file.   History of Present Illness:  Katherine [name] is a 56 y.o. y.o. female who is seen in consultation at the request of Lindell Spar, MD for an evaluation of Pap test showing squama cell carcinoma.  Patient reports doing well.  She developed symptoms of urgency and dysuria over the weekend, concerning for urinary tract infection.  She used Azo over the weekend and then called to report her symptoms.  Finished an antibiotic this morning and has had resolution of her symptoms.  She has not had urinary tract infection in approximately 2 years.  The patient denies any postmenopausal bleeding.  She denies any vaginal discharge or pelvic pain.  She reports normal bowel bladder function.  She endorses a good appetite without nausea or emesis.  She denies any recent weight changes.  She thinks her last Pap test was about 7 years ago.  Does not remember ever having abnormal Paps previously.  She lost her insurance after she split with her husband.  She now has insurance again and is trying to get up-to-date on all of her health maintenance.  Pap test was performed on 05/23/22 and showed squamous cell carcinoma, + HPV 16.  Patient lives alone.  She is not currently working.  She denies any tobacco or alcohol use.  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 Patient is postmenopausal.  Age at menarche: 4 Age at menopause: 89 Hx of HRT: Denies Hx of STDs: HPV Last pap: See HPI History of abnormal pap smears: See HPI  SCREENING STUDIES:  Last mammogram: 2023  Last colonoscopy: 2023  MEDICATIONS: Outpatient Encounter Medications as of 06/17/2022  Medication Sig   albuterol (VENTOLIN HFA) 108 (90 Base) MCG/ACT inhaler Inhale 2 puffs into the lungs every 6 (six) hours as needed for wheezing or shortness of breath.   cholecalciferol (VITAMIN D3) 25 MCG (1000 UNIT) tablet Take 1,000 Units by mouth daily.   cyanocobalamin (VITAMIN B12) 1000 MCG tablet Take 1,000 mcg by mouth daily.   magnesium 30 MG tablet Take 30 mg by mouth 2 (two) times daily.   omeprazole (PRILOSEC) 20 MG capsule TAKE 1 CAPSULE BY MOUTH EVERY DAY   Probiotic Product (PROBIOTIC DAILY PO) Take 1 capsule by mouth daily.   sertraline (ZOLOFT) 25 MG tablet Take 1 tablet (25 mg total) by mouth daily.   [DISCONTINUED] nitrofurantoin, macrocrystal-monohydrate, (MACROBID) 100 MG capsule Take 1 capsule (100 mg total) by mouth 2 (two) times daily for 5 days.   [DISCONTINUED] hydrocortisone (ANUSOL-HC) 2.5 % rectal cream Place 1 Application rectally 2 (two) times daily. For hemorrhoid flares (Patient not taking: Reported on 06/09/2022)   [DISCONTINUED] ketoconazole (NIZORAL) 2 % cream Apply 1 Application topically 2 (two) times daily as needed for irritation. To rectum for itching   No facility-administered encounter medications on file as of  06/17/2022.    ALLERGIES:  Allergies  Allergen Reactions   Amoxicillin Hives     FAMILY HISTORY:  Family History  Problem Relation Age of Onset   Hypothyroidism Mother    Hypertension Mother    Anxiety disorder Mother    Arthritis Mother    Hypertension Father    Stroke Father    Hypothyroidism Sister    ADD / ADHD Sister    Asthma Sister    ADD / ADHD Sister    ADD / ADHD Sister    ADD / ADHD Brother    ADD / ADHD Brother    Colon cancer Brother    Depression Daughter    Depression Daughter    Colon polyps Neg Hx    Breast cancer Neg Hx    Ovarian  cancer Neg Hx    Endometrial cancer Neg Hx    Pancreatic cancer Neg Hx    Prostate cancer Neg Hx      SOCIAL HISTORY:  Social Connections: Unknown (04/25/2022)   Social Connection and Isolation Panel [NHANES]    Frequency of Communication with Friends and Family: Once a week    Frequency of Social Gatherings with Friends and Family: Once a week    Attends Religious Services: Not on Advertising copywriter or Organizations: No    Attends Archivist Meetings: Never    Marital Status: Divorced    REVIEW OF SYSTEMS:  + depression Denies appetite changes, fevers, chills, fatigue, unexplained weight changes. Denies hearing loss, neck lumps or masses, mouth sores, ringing in ears or voice changes. Denies cough or wheezing.  Denies shortness of breath. Denies chest pain or palpitations. Denies leg swelling. Denies abdominal distention, pain, blood in stools, constipation, diarrhea, nausea, vomiting, or early satiety. Denies pain with intercourse, dysuria, frequency, hematuria or incontinence. Denies hot flashes, pelvic pain, vaginal bleeding or vaginal discharge.   Denies joint pain, back pain or muscle pain/cramps. Denies itching, rash, or wounds. Denies dizziness, headaches, numbness or seizures. Denies swollen lymph nodes or glands, denies easy bruising or bleeding. Denies anxiety, confusion, or  decreased concentration.  Physical Exam:  Vital Signs for this encounter:  Blood pressure (!) 163/86, pulse 64, temperature 98.4 F (36.9 C), temperature source Oral, resp. rate 14, height 5' (1.524 m), weight 139 lb (63 kg), SpO2 98 %. Body mass index is 27.15 kg/m. General: Alert, oriented, no acute distress.  HEENT: Normocephalic, atraumatic. Sclera anicteric.  Chest: Clear to auscultation bilaterally. No wheezes, rhonchi, or rales. Cardiovascular: Regular rate and rhythm, no murmurs, rubs, or gallops.  Abdomen: Normoactive bowel sounds. Soft, nondistended, nontender to palpation. No masses or hepatosplenomegaly appreciated. No palpable fluid wave.  Well-healed Pfannenstiel incision as well as left upper quadrant lateral incision. Extremities: Grossly normal range of motion. Warm, well perfused. No edema bilaterally.  Skin: No rashes or lesions.  Lymphatics: No cervical, supraclavicular, or inguinal adenopathy.  GU:  Normal external female genitalia. No lesions. No discharge or bleeding.             Bladder/urethra:  No lesions or masses, well supported bladder             Vagina: Mildly atrophic, no lesions or masses noted.             Cervix: Normal in size and appearance.  Some slightly prominent glandular tissue along the right endocervix at 9:00 visible prior to application of acetic acid.  See below procedure note for colposcopic findings.  On bimanual exam, the cervix is not firm or nodular.              Uterus: Small, mobile, no parametrial involvement or nodularity.             Adnexa: No masses appreciated.  Rectal: No parametrial involvement, confirms above findings.  Colposcopy procedure, cervical biopsies Preoperative diagnosis: SCC of recent pap test Postoperative diagnosis: Same as above Physician: Berline Lopes MD Estimated blood loss: Minimal Specimens: Cervical biopsies from 9 and 12:00, endocervical curettage Procedure: After the procedure was discussed with the patient  including risks and benefits, she gave verbal consent.  She was then placed in dorsolithotomy position and a speculum was placed in the vagina.  Once the cervix was well visualized, percent acetic acid was applied to the cervix.  Colposcopy was then performed with examination of the entire cervix.  Acetowhite changes were noted spanning from approximately 8:00 to 1:00 with atypical vascularity and mosaicism noted at 9:00 and 12:00.  Entire squamocolumnar junction could not be seen.  The cervix itself was cleansed with Betadine x3.  Tischler forceps were used to take 2 biopsies from the face of the cervix that were each placed in separate containers of formalin.  A Kevorkian curette and Cytobrush were then used to collect an endocervical sampling which was placed in formalin.  Pressure and silver nitrate were used to achieve hemostasis.  Overall the patient tolerated the procedure well.  All instruments were removed from the vagina.  LABORATORY AND RADIOLOGIC DATA:  Outside medical records were reviewed to synthesize the above history, along with the history and physical obtained during the visit.   Lab Results  Component Value Date   WBC 6.5 04/27/2022   HGB 13.8 04/27/2022   HCT 41.6 04/27/2022   PLT 262 04/27/2022   GLUCOSE 91 04/27/2022   CHOL 248 (H) 04/27/2022   TRIG 145 04/27/2022   HDL 76 04/27/2022   LDLCALC 147 (H) 04/27/2022   ALT 24 04/27/2022   AST 29 04/27/2022   NA 141 04/27/2022   K 4.8 04/27/2022   CL 103 04/27/2022   CREATININE 0.72 04/27/2022   BUN 12 04/27/2022   CO2 23 04/27/2022   TSH 2.350 04/27/2022   HGBA1C 5.5 04/27/2022

## 2022-06-16 NOTE — Progress Notes (Unsigned)
GYNECOLOGIC ONCOLOGY NEW PATIENT CONSULTATION   Patient Name: Katherine [name]  Patient Age: 56 y.o. Date of Service: 05/17/22 Referring Provider: Lindell Spar, MD [address] [place],   [address]   Primary Care Provider: Lindell Spar, MD Consulting Provider: Jeral Pinch, MD   Assessment/Plan:  56 year old with recent Pap smear showing squamous cell carcinoma and findings on colposcopy of high-grade dysplasia.  Reviewed with the patient her recent Pap test results.  We discussed that confirmation is needed by pathology assessment.  Colposcopy was performed today with findings concerning for high-grade dysplasia although no overt findings of malignancy.  Multiple biopsies were taken.  I will call the patient when I get these results.  She understands that despite no obvious cancer on exam today, microscopically, there may be a cervical cancer which led to her Pap test results.  I think very likely she will require a cervical excision procedure.  This would be true in the setting of biopsies that do not show high-grade carcinoma or malignancy given discordancy with the Pap test and biopsy results.  In the setting of high-grade dysplasia, excisional biopsy may provide all the treatment she needs.  In the setting of a biopsy showing cervical squamous cell carcinoma, given her exam, excisional biopsy would be important to determine next step in her management.  We discussed the etiology of the majority of cervical dysplasia and malignancy.  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  She is not on any immunosuppressant medications nor does she have any diseases that would cause immunosuppression.  She denies any history of abnormal Pap smears but has not had one in likely 7+ years.  If biopsies confirm cervical cancer, we will recommend that she get HIV testing.  A copy of this note was sent to the patient's referring provider.   60 minutes of total time was spent for this patient  encounter, including preparation, face-to-face counseling with the patient and coordination of care, and documentation of the encounter.   Jeral Pinch, MD  Division of Gynecologic Oncology  Department of Obstetrics and Gynecology  [facility]  ___________________________________________  Chief Complaint: No chief complaint on file.   History of Present Illness:  Katherine [name] is a 56 y.o. y.o. female who is seen in consultation at the request of Lindell Spar, MD for an evaluation of Pap test showing squama cell carcinoma.  Patient reports doing well.  She developed symptoms of urgency and dysuria over the weekend, concerning for urinary tract infection.  She used Azo over the weekend and then called to report her symptoms.  Finished an antibiotic this morning and has had resolution of her symptoms.  She has not had urinary tract infection in approximately 2 years.  The patient denies any postmenopausal bleeding.  She denies any vaginal discharge or pelvic pain.  She reports normal bowel bladder function.  She endorses a good appetite without nausea or emesis.  She denies any recent weight changes.  She thinks her last Pap test was about 7 years ago.  Does not remember ever having abnormal Paps previously.  She lost her insurance after she split with her husband.  She now has insurance again and is trying to get up-to-date on all of her health maintenance.  Pap test was performed on 05/23/22 and showed squamous cell carcinoma, + HPV 16.  Patient lives alone.  She is not currently working.  She denies any tobacco or alcohol use.  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 Patient is postmenopausal.  Age at menarche: 63 Age at menopause: 23 Hx of HRT: Denies Hx of STDs: HPV Last pap: See HPI History of abnormal pap smears: See HPI  SCREENING STUDIES:  Last mammogram: 2023  Last colonoscopy: 2023  MEDICATIONS: Outpatient Encounter Medications as of 06/17/2022  Medication Sig   albuterol (VENTOLIN HFA) 108 (90 Base) MCG/ACT inhaler Inhale 2 puffs into the lungs every 6 (six) hours as needed for wheezing or shortness of breath.   cholecalciferol (VITAMIN D3) 25 MCG (1000 UNIT) tablet Take 1,000 Units by mouth daily.   cyanocobalamin (VITAMIN B12) 1000 MCG tablet Take 1,000 mcg by mouth daily.   magnesium 30 MG tablet Take 30 mg by mouth 2 (two) times daily.   omeprazole (PRILOSEC) 20 MG capsule TAKE 1 CAPSULE BY MOUTH EVERY DAY   Probiotic Product (PROBIOTIC DAILY PO) Take 1 capsule by mouth daily.   sertraline (ZOLOFT) 25 MG tablet Take 1 tablet (25 mg total) by mouth daily.   [DISCONTINUED] nitrofurantoin, macrocrystal-monohydrate, (MACROBID) 100 MG capsule Take 1 capsule (100 mg total) by mouth 2 (two) times daily for 5 days.   [DISCONTINUED] hydrocortisone (ANUSOL-HC) 2.5 % rectal cream Place 1 Application rectally 2 (two) times daily. For hemorrhoid flares (Patient not taking: Reported on 06/09/2022)   [DISCONTINUED] ketoconazole (NIZORAL) 2 % cream Apply 1 Application topically 2 (two) times daily as needed for irritation. To rectum for itching   No facility-administered encounter medications on file as of  06/17/2022.    ALLERGIES:  Allergies  Allergen Reactions   Amoxicillin Hives     FAMILY HISTORY:  Family History  Problem Relation Age of Onset   Hypothyroidism Mother    Hypertension Mother    Anxiety disorder Mother    Arthritis Mother    Hypertension Father    Stroke Father    Hypothyroidism Sister    ADD / ADHD Sister    Asthma Sister    ADD / ADHD Sister    ADD / ADHD Sister    ADD / ADHD Brother    ADD / ADHD Brother    Colon cancer Brother    Depression Daughter    Depression Daughter    Colon polyps Neg Hx    Breast cancer Neg Hx    Ovarian  cancer Neg Hx    Endometrial cancer Neg Hx    Pancreatic cancer Neg Hx    Prostate cancer Neg Hx      SOCIAL HISTORY:  Social Connections: Unknown (04/25/2022)   Social Connection and Isolation Panel [NHANES]    Frequency of Communication with Friends and Family: Once a week    Frequency of Social Gatherings with Friends and Family: Once a week    Attends Religious Services: Not on Advertising copywriter or Organizations: No    Attends Archivist Meetings: Never    Marital Status: Divorced    REVIEW OF SYSTEMS:  + depression Denies appetite changes, fevers, chills, fatigue, unexplained weight changes. Denies hearing loss, neck lumps or masses, mouth sores, ringing in ears or voice changes. Denies cough or wheezing.  Denies shortness of breath. Denies chest pain or palpitations. Denies leg swelling. Denies abdominal distention, pain, blood in stools, constipation, diarrhea, nausea, vomiting, or early satiety. Denies pain with intercourse, dysuria, frequency, hematuria or incontinence. Denies hot flashes, pelvic pain, vaginal bleeding or vaginal discharge.   Denies joint pain, back pain or muscle pain/cramps. Denies itching, rash, or wounds. Denies dizziness, headaches, numbness or seizures. Denies swollen lymph nodes or glands, denies easy bruising or bleeding. Denies anxiety, confusion, or  decreased concentration.  Physical Exam:  Vital Signs for this encounter:  Blood pressure (!) 163/86, pulse 64, temperature 98.4 F (36.9 C), temperature source Oral, resp. rate 14, height 5' (1.524 m), weight 139 lb (63 kg), SpO2 98 %. Body mass index is 27.15 kg/m. General: Alert, oriented, no acute distress.  HEENT: Normocephalic, atraumatic. Sclera anicteric.  Chest: Clear to auscultation bilaterally. No wheezes, rhonchi, or rales. Cardiovascular: Regular rate and rhythm, no murmurs, rubs, or gallops.  Abdomen: Normoactive bowel sounds. Soft, nondistended, nontender to palpation. No masses or hepatosplenomegaly appreciated. No palpable fluid wave.  Well-healed Pfannenstiel incision as well as left upper quadrant lateral incision. Extremities: Grossly normal range of motion. Warm, well perfused. No edema bilaterally.  Skin: No rashes or lesions.  Lymphatics: No cervical, supraclavicular, or inguinal adenopathy.  GU:  Normal external female genitalia. No lesions. No discharge or bleeding.             Bladder/urethra:  No lesions or masses, well supported bladder             Vagina: Mildly atrophic, no lesions or masses noted.             Cervix: Normal in size and appearance.  Some slightly prominent glandular tissue along the right endocervix at 9:00 visible prior to application of acetic acid.  See below procedure note for colposcopic findings.  On bimanual exam, the cervix is not firm or nodular.              Uterus: Small, mobile, no parametrial involvement or nodularity.             Adnexa: No masses appreciated.  Rectal: No parametrial involvement, confirms above findings.  Colposcopy procedure, cervical biopsies Preoperative diagnosis: SCC of recent pap test Postoperative diagnosis: Same as above Physician: Berline Lopes MD Estimated blood loss: Minimal Specimens: Cervical biopsies from 9 and 12:00, endocervical curettage Procedure: After the procedure was discussed with the patient  including risks and benefits, she gave verbal consent.  She was then placed in dorsolithotomy position and a speculum was placed in the vagina.  Once the cervix was well visualized, percent acetic acid was applied to the cervix.  Colposcopy was then performed with examination of the entire cervix.  Acetowhite changes were noted spanning from approximately 8:00 to 1:00 with atypical vascularity and mosaicism noted at 9:00 and 12:00.  Entire squamocolumnar junction could not be seen.  The cervix itself was cleansed with Betadine x3.  Tischler forceps were used to take 2 biopsies from the face of the cervix that were each placed in separate containers of formalin.  A Kevorkian curette and Cytobrush were then used to collect an endocervical sampling which was placed in formalin.  Pressure and silver nitrate were used to achieve hemostasis.  Overall the patient tolerated the procedure well.  All instruments were removed from the vagina.  LABORATORY AND RADIOLOGIC DATA:  Outside medical records were reviewed to synthesize the above history, along with the history and physical obtained during the visit.   Lab Results  Component Value Date   WBC 6.5 04/27/2022   HGB 13.8 04/27/2022   HCT 41.6 04/27/2022   PLT 262 04/27/2022   GLUCOSE 91 04/27/2022   CHOL 248 (H) 04/27/2022   TRIG 145 04/27/2022   HDL 76 04/27/2022   LDLCALC 147 (H) 04/27/2022   ALT 24 04/27/2022   AST 29 04/27/2022   NA 141 04/27/2022   K 4.8 04/27/2022   CL 103 04/27/2022   CREATININE 0.72 04/27/2022   BUN 12 04/27/2022   CO2 23 04/27/2022   TSH 2.350 04/27/2022   HGBA1C 5.5 04/27/2022

## 2022-06-17 ENCOUNTER — Inpatient Hospital Stay: Payer: Medicare HMO | Attending: Gynecologic Oncology | Admitting: Gynecologic Oncology

## 2022-06-17 ENCOUNTER — Other Ambulatory Visit: Payer: Self-pay

## 2022-06-17 ENCOUNTER — Encounter: Payer: Self-pay | Admitting: Gynecologic Oncology

## 2022-06-17 VITALS — BP 163/86 | HR 64 | Temp 98.4°F | Resp 14 | Ht 60.0 in | Wt 139.0 lb

## 2022-06-17 DIAGNOSIS — B977 Papillomavirus as the cause of diseases classified elsewhere: Secondary | ICD-10-CM | POA: Diagnosis not present

## 2022-06-17 DIAGNOSIS — R87614 Cytologic evidence of malignancy on smear of cervix: Secondary | ICD-10-CM

## 2022-06-17 DIAGNOSIS — C539 Malignant neoplasm of cervix uteri, unspecified: Secondary | ICD-10-CM | POA: Insufficient documentation

## 2022-06-17 DIAGNOSIS — D069 Carcinoma in situ of cervix, unspecified: Secondary | ICD-10-CM | POA: Diagnosis not present

## 2022-06-17 DIAGNOSIS — C530 Malignant neoplasm of endocervix: Secondary | ICD-10-CM | POA: Diagnosis not present

## 2022-06-17 DIAGNOSIS — N72 Inflammatory disease of cervix uteri: Secondary | ICD-10-CM | POA: Diagnosis not present

## 2022-06-17 NOTE — Patient Instructions (Addendum)
It was very nice to meet you today.  I will call you next week with your biopsy results once they are back.  Depending on what these show, we may need to get you set up for a procedure in the operating room to get a larger biopsy from your cervix.  We will discuss this further when I call you next week.

## 2022-06-21 ENCOUNTER — Other Ambulatory Visit: Payer: Self-pay | Admitting: Gynecologic Oncology

## 2022-06-21 ENCOUNTER — Ambulatory Visit (HOSPITAL_BASED_OUTPATIENT_CLINIC_OR_DEPARTMENT_OTHER): Payer: Medicare HMO | Admitting: Anesthesiology

## 2022-06-21 ENCOUNTER — Encounter (HOSPITAL_COMMUNITY)
Admission: RE | Disposition: A | Discharge status: Home / Self Care | Payer: Self-pay | Source: Ambulatory Visit | Attending: Internal Medicine

## 2022-06-21 ENCOUNTER — Encounter (HOSPITAL_COMMUNITY): Payer: Self-pay

## 2022-06-21 ENCOUNTER — Telehealth: Payer: Self-pay

## 2022-06-21 ENCOUNTER — Ambulatory Visit (HOSPITAL_COMMUNITY): Payer: Medicare HMO | Admitting: Anesthesiology

## 2022-06-21 ENCOUNTER — Ambulatory Visit (HOSPITAL_COMMUNITY)
Admission: RE | Admit: 2022-06-21 | Discharge: 2022-06-21 | Disposition: A | Discharge status: Home / Self Care | Payer: Medicare HMO | Source: Ambulatory Visit | Attending: Internal Medicine | Admitting: Internal Medicine

## 2022-06-21 DIAGNOSIS — D120 Benign neoplasm of cecum: Secondary | ICD-10-CM | POA: Diagnosis not present

## 2022-06-21 DIAGNOSIS — K635 Polyp of colon: Secondary | ICD-10-CM

## 2022-06-21 DIAGNOSIS — K449 Diaphragmatic hernia without obstruction or gangrene: Secondary | ICD-10-CM | POA: Insufficient documentation

## 2022-06-21 DIAGNOSIS — K222 Esophageal obstruction: Secondary | ICD-10-CM

## 2022-06-21 DIAGNOSIS — R12 Heartburn: Secondary | ICD-10-CM | POA: Insufficient documentation

## 2022-06-21 DIAGNOSIS — Z1212 Encounter for screening for malignant neoplasm of rectum: Secondary | ICD-10-CM | POA: Diagnosis not present

## 2022-06-21 DIAGNOSIS — C539 Malignant neoplasm of cervix uteri, unspecified: Secondary | ICD-10-CM

## 2022-06-21 DIAGNOSIS — Z1211 Encounter for screening for malignant neoplasm of colon: Secondary | ICD-10-CM | POA: Diagnosis not present

## 2022-06-21 DIAGNOSIS — J45909 Unspecified asthma, uncomplicated: Secondary | ICD-10-CM | POA: Diagnosis not present

## 2022-06-21 DIAGNOSIS — K219 Gastro-esophageal reflux disease without esophagitis: Secondary | ICD-10-CM | POA: Diagnosis not present

## 2022-06-21 DIAGNOSIS — F418 Other specified anxiety disorders: Secondary | ICD-10-CM | POA: Diagnosis not present

## 2022-06-21 DIAGNOSIS — K648 Other hemorrhoids: Secondary | ICD-10-CM | POA: Diagnosis not present

## 2022-06-21 HISTORY — PX: COLONOSCOPY WITH PROPOFOL: SHX5780

## 2022-06-21 HISTORY — PX: ESOPHAGOGASTRODUODENOSCOPY (EGD) WITH PROPOFOL: SHX5813

## 2022-06-21 HISTORY — PX: POLYPECTOMY: SHX5525

## 2022-06-21 LAB — SURGICAL PATHOLOGY

## 2022-06-21 SURGERY — COLONOSCOPY WITH PROPOFOL
Anesthesia: General

## 2022-06-21 MED ORDER — LIDOCAINE HCL (CARDIAC) PF 100 MG/5ML IV SOSY
PREFILLED_SYRINGE | INTRAVENOUS | Status: DC | PRN
  Administered 2022-06-21: 50 mg via INTRATRACHEAL

## 2022-06-21 MED ORDER — LACTATED RINGERS IV SOLN: INTRAVENOUS | Status: DC

## 2022-06-21 MED ORDER — PROPOFOL 10 MG/ML IV BOLUS
INTRAVENOUS | Status: DC | PRN
  Administered 2022-06-21: 100 mg via INTRAVENOUS

## 2022-06-21 MED ORDER — PROPOFOL 500 MG/50ML IV EMUL
INTRAVENOUS | Status: DC | PRN
  Administered 2022-06-21: 200 ug/kg/min via INTRAVENOUS

## 2022-06-21 NOTE — Op Note (Signed)
[facility] Patient Name: Katherine [name] Procedure Date: 06/21/2022 9:07 AM MRN: [id number] Date of Birth: Oct 31, 1965 Attending MD: Elon Alas. Abbey Chatters DO CSN: [id number] Age: 56 Admit Type: Outpatient Procedure:                Upper GI endoscopy Indications:              Heartburn Providers:                Elon Alas. Abbey Chatters, DO, Lambert Mody, Lorin Picket, Technician Referring MD:              Medicines:                See the Anesthesia note for documentation of the                            administered medications Complications:            No immediate complications. Estimated Blood Loss:     Estimated blood loss: none. Procedure:                Pre-Anesthesia Assessment:                           - The anesthesia plan was to use monitored                            anesthesia care (MAC).                           After obtaining informed consent, the endoscope was                            passed under direct vision. Throughout the                            procedure, the patient's blood pressure, pulse, and                            oxygen saturations were monitored continuously. The                            [id number]-H190 ([id number]) scope was introduced through the                            mouth, and advanced to the second part of duodenum.                            The upper GI endoscopy was accomplished without                            difficulty. The patient tolerated the procedure                            well. Scope In: 9:13:17 AM Scope Out:  9:15:13 AM Total Procedure Duration: 0 hours 1 minute 56 seconds  Findings:      A small hiatal hernia was present.      A non-obstructing Schatzki ring was found in the distal esophagus.      The entire examined stomach was normal.      The duodenal bulb, first portion of the duodenum and second portion of       the duodenum were normal. Impression:               - Small  hiatal hernia.                           - Non-obstructing Schatzki ring.                           - Normal stomach.                           - Normal duodenal bulb, first portion of the                            duodenum and second portion of the duodenum.                           - No specimens collected. Moderate Sedation:      Per Anesthesia Care Recommendation:           - Patient has a contact number available for                            emergencies. The signs and symptoms of potential                            delayed complications were discussed with the                            patient. Return to normal activities tomorrow.                            Written discharge instructions were provided to the                            patient.                           - Resume previous diet.                           - Continue present medications.                           - Use a proton pump inhibitor PO daily.                           - Return to GI clinic PRN. Procedure Code(s):        --- Professional ---  29562, Esophagogastroduodenoscopy, flexible,                            transoral; diagnostic, including collection of                            specimen(s) by brushing or washing, when performed                            (separate procedure) Diagnosis Code(s):        --- Professional ---                           K44.9, Diaphragmatic hernia without obstruction or                            gangrene                           K22.2, Esophageal obstruction                           R12, Heartburn CPT copyright 2019 American Medical Association. All rights reserved. The codes documented in this report are preliminary and upon coder review may  be revised to meet current compliance requirements. Elon Alas. Abbey Chatters, DO [place] Abbey Chatters, DO 06/21/2022 9:17:20 AM This report has been signed electronically. Number of Addenda: 0

## 2022-06-21 NOTE — Op Note (Signed)
[facility] Patient Name: Katherine [name] Procedure Date: 06/21/2022 9:07 AM MRN: [id number] Date of Birth: 1965/10/11 Attending MD: Elon Alas. Edgar Frisk CSN: [id number] Age: 56 Admit Type: Outpatient Procedure:                Colonoscopy Indications:              Screening for colorectal malignant neoplasm Providers:                Elon Alas. Abbey Chatters, DO, Lambert Mody, Caprice Kluver Referring MD:              Medicines:                See the Anesthesia note for documentation of the                            administered medications Complications:            No immediate complications. Estimated Blood Loss:     Estimated blood loss was minimal. Procedure:                Pre-Anesthesia Assessment:                           - The anesthesia plan was to use monitored                            anesthesia care (MAC).                           After obtaining informed consent, the colonoscope                            was passed under direct vision. Throughout the                            procedure, the patient's blood pressure, pulse, and                            oxygen saturations were monitored continuously. The                            [id number]-HQ190L ([id number]) scope was introduced through                            the anus and advanced to the the cecum, identified                            by appendiceal orifice and ileocecal valve. The                            colonoscopy was performed without difficulty. The                            patient tolerated the procedure well. The quality                            of  the bowel preparation was evaluated using the                            BBPS [facility] Bowel Preparation Scale) with scores                            of: Right Colon = 3, Transverse Colon = 3 and Left                            Colon = 3 (entire mucosa seen well with no residual                            staining, small fragments of stool or opaque                             liquid). The total BBPS score equals 9. Scope In: 9:19:45 AM Scope Out: 9:29:36 AM Scope Withdrawal Time: 0 hours 6 minutes 5 seconds  Total Procedure Duration: 0 hours 9 minutes 51 seconds  Findings:      The perianal and digital rectal examinations were normal.      Non-bleeding internal hemorrhoids were found during endoscopy.      A 12 mm polyp was found in the cecum. The polyp was sessile. The polyp       was removed with a cold snare. Resection and retrieval were complete.      The exam was otherwise without abnormality. Impression:               - Non-bleeding internal hemorrhoids.                           - One 12 mm polyp in the cecum, removed with a cold                            snare. Resected and retrieved.                           - The examination was otherwise normal. Moderate Sedation:      Per Anesthesia Care Recommendation:           - Patient has a contact number available for                            emergencies. The signs and symptoms of potential                            delayed complications were discussed with the                            patient. Return to normal activities tomorrow.                            Written discharge instructions were provided to the                            patient.                           -  Resume previous diet.                           - Continue present medications.                           - Await pathology results.                           - Repeat colonoscopy in 3 years for surveillance.                           - Return to GI clinic PRN. Procedure Code(s):        --- Professional ---                           [id number], Colonoscopy, flexible; with removal of                            tumor(s), polyp(s), or other lesion(s) by snare                            technique Diagnosis Code(s):        --- Professional ---                           Z12.11, Encounter for screening for malignant                             neoplasm of colon                           K63.5, Polyp of colon                           K64.8, Other hemorrhoids CPT copyright 2019 American Medical Association. All rights reserved. The codes documented in this report are preliminary and upon coder review may  be revised to meet current compliance requirements. Elon Alas. Abbey Chatters, DO [place] Abbey Chatters, DO 06/21/2022 9:31:47 AM This report has been signed electronically. Number of Addenda: 0

## 2022-06-21 NOTE — Anesthesia Preprocedure Evaluation (Signed)
Anesthesia Evaluation  Patient identified by MRN, date of birth, ID band Patient awake    Reviewed: Allergy & Precautions, H&P , NPO status , Patient's Chart, lab work & pertinent test results, reviewed documented beta blocker date and time   Airway Mallampati: II  TM Distance: >3 FB Neck ROM: full    Dental no notable dental hx.    Pulmonary asthma ,    Pulmonary exam normal breath sounds clear to auscultation       Cardiovascular Exercise Tolerance: Good negative cardio ROS   Rhythm:regular Rate:Normal     Neuro/Psych PSYCHIATRIC DISORDERS Anxiety Depression negative neurological ROS     GI/Hepatic Neg liver ROS, GERD  Medicated,  Endo/Other  negative endocrine ROS  Renal/GU negative Renal ROS  negative genitourinary   Musculoskeletal   Abdominal   Peds  Hematology negative hematology ROS (+)   Anesthesia Other Findings   Reproductive/Obstetrics negative OB ROS                             Anesthesia Physical Anesthesia Plan  ASA: 2  Anesthesia Plan: General   Post-op Pain Management:    Induction:   PONV Risk Score and Plan: Propofol infusion  Airway Management Planned:   Additional Equipment:   Intra-op Plan:   Post-operative Plan:   Informed Consent: I have reviewed the patients History and Physical, chart, labs and discussed the procedure including the risks, benefits and alternatives for the proposed anesthesia with the patient or authorized representative who has indicated his/her understanding and acceptance.     Dental Advisory Given  Plan Discussed with: CRNA  Anesthesia Plan Comments:         Anesthesia Quick Evaluation

## 2022-06-21 NOTE — Transfer of Care (Signed)
Immediate Anesthesia Transfer of Care Note  Patient: Katherine [name]  Procedure(s) Performed: COLONOSCOPY WITH PROPOFOL ESOPHAGOGASTRODUODENOSCOPY (EGD) WITH PROPOFOL POLYPECTOMY  Patient Location: Short Stay  Anesthesia Type:General  Level of Consciousness: awake, alert , oriented and patient cooperative  Airway & Oxygen Therapy: Patient Spontanous Breathing  Post-op Assessment: Report given to RN, Post -op Vital signs reviewed and stable and Patient moving all extremities  Post vital signs: Reviewed and stable  Last Vitals:  Vitals Value Taken Time  BP 108/63 06/21/22 0933  Temp    Pulse 82 06/21/22 0933  Resp 19 06/21/22 0933  SpO2 96 % 06/21/22 0933    Last Pain:  Vitals:   06/21/22 0910  TempSrc:   PainSc: 0-No pain      Patients Stated Pain Goal: 6 (99/87/21 5872)  Complications: No notable events documented.

## 2022-06-21 NOTE — Progress Notes (Signed)
Called patient.  Discussed biopsy results.  Have ordered PET scan to evaluate for metastatic disease.  Discussed neck step in work-up will be cold knife conization to determine size and depth of invasion to help dictate surgical treatment (as long as a candidate by imaging).  Jeral Pinch MD Gynecologic Oncology

## 2022-06-21 NOTE — Anesthesia Postprocedure Evaluation (Signed)
Anesthesia Post Note  Patient: Shannette Tabares  Procedure(s) Performed: COLONOSCOPY WITH PROPOFOL ESOPHAGOGASTRODUODENOSCOPY (EGD) WITH PROPOFOL POLYPECTOMY  Patient location during evaluation: Phase II Anesthesia Type: General Level of consciousness: awake Pain management: pain level controlled Vital Signs Assessment: post-procedure vital signs reviewed and stable Respiratory status: spontaneous breathing and respiratory function stable Cardiovascular status: blood pressure returned to baseline and stable Postop Assessment: no headache and no apparent nausea or vomiting Anesthetic complications: no Comments: Late entry   No notable events documented.   Last Vitals:  Vitals:   06/21/22 0933 06/21/22 0936  BP: 108/63 131/77  Pulse: 82 83  Resp: 19 (!) 22  Temp: (!) 36.4 C   SpO2: 96% 98%    Last Pain:  Vitals:   06/21/22 0936  TempSrc:   PainSc: 0-No pain                 Louann Sjogren

## 2022-06-21 NOTE — Discharge Instructions (Addendum)
EGD Discharge instructions Please read the instructions outlined below and refer to this sheet in the next few weeks. These discharge instructions provide you with general information on caring for yourself after you leave the hospital. Your doctor may also give you specific instructions. While your treatment has been planned according to the most current medical practices available, unavoidable complications occasionally occur. If you have any problems or questions after discharge, please call your doctor. ACTIVITY You may resume your regular activity but move at a slower pace for the next 24 hours.  Take frequent rest periods for the next 24 hours.  Walking will help expel (get rid of) the air and reduce the bloated feeling in your abdomen.  No driving for 24 hours (because of the anesthesia (medicine) used during the test).  You may shower.  Do not sign any important legal documents or operate any machinery for 24 hours (because of the anesthesia used during the test).  NUTRITION Drink plenty of fluids.  You may resume your normal diet.  Begin with a light meal and progress to your normal diet.  Avoid alcoholic beverages for 24 hours or as instructed by your caregiver.  MEDICATIONS You may resume your normal medications unless your caregiver tells you otherwise.  WHAT YOU CAN EXPECT TODAY You may experience abdominal discomfort such as a feeling of fullness or "gas" pains.  FOLLOW-UP Your doctor will discuss the results of your test with you.  SEEK IMMEDIATE MEDICAL ATTENTION IF ANY OF THE FOLLOWING OCCUR: Excessive nausea (feeling sick to your stomach) and/or vomiting.  Severe abdominal pain and distention (swelling).  Trouble swallowing.  Temperature over 101 F (37.8 C).  Rectal bleeding or vomiting of blood.     Colonoscopy Discharge Instructions  Read the instructions outlined below and refer to this sheet in the next few weeks. These discharge instructions provide you with  general information on caring for yourself after you leave the hospital. Your doctor may also give you specific instructions. While your treatment has been planned according to the most current medical practices available, unavoidable complications occasionally occur.   ACTIVITY You may resume your regular activity, but move at a slower pace for the next 24 hours.  Take frequent rest periods for the next 24 hours.  Walking will help get rid of the air and reduce the bloated feeling in your belly (abdomen).  No driving for 24 hours (because of the medicine (anesthesia) used during the test).   Do not sign any important legal documents or operate any machinery for 24 hours (because of the anesthesia used during the test).  NUTRITION Drink plenty of fluids.  You may resume your normal diet as instructed by your doctor.  Begin with a light meal and progress to your normal diet. Heavy or fried foods are harder to digest and may make you feel sick to your stomach (nauseated).  Avoid alcoholic beverages for 24 hours or as instructed.  MEDICATIONS You may resume your normal medications unless your doctor tells you otherwise.  WHAT YOU CAN EXPECT TODAY Some feelings of bloating in the abdomen.  Passage of more gas than usual.  Spotting of blood in your stool or on the toilet paper.  IF YOU HAD POLYPS REMOVED DURING THE COLONOSCOPY: No aspirin products for 7 days or as instructed.  No alcohol for 7 days or as instructed.  Eat a soft diet for the next 24 hours.  FINDING OUT THE RESULTS OF YOUR TEST Not all test results are  available during your visit. If your test results are not back during the visit, make an appointment with your caregiver to find out the results. Do not assume everything is normal if you have not heard from your caregiver or the medical facility. It is important for you to follow up on all of your test results.  SEEK IMMEDIATE MEDICAL ATTENTION IF: You have more than a spotting of  blood in your stool.  Your belly is swollen (abdominal distention).  You are nauseated or vomiting.  You have a temperature over 101.  You have abdominal pain or discomfort that is severe or gets worse throughout the day.   Your upper endoscopy revealed a small hiatal hernia, nonobstructive Schatzki's ring, no evidence of Barrett's esophagus.  Stomach and small bowel appeared normal.  Continue on omeprazole daily.  Your colonoscopy revealed 1 polyp(s) which I removed successfully. This polyp was >1 cm. Await pathology results, my office will contact you. I recommend repeating colonoscopy in 3 years for surveillance purposes.   Otherwise follow-up with GI as needed.     I hope you have a great rest of your week!  Elon Alas. Abbey Chatters, D.O. Gastroenterology and Hepatology [facility]

## 2022-06-21 NOTE — Telephone Encounter (Signed)
Called patient to inform her of her pet scan that is scheduled for 06/29/2022 at 8:30am.  Advised her to arrive around 8:00am and to have nothing to eat or drink 6 hours prior.  Patient confirmed.

## 2022-06-21 NOTE — Interval H&P Note (Signed)
History and Physical Interval Note:  06/21/2022 8:29 AM  Katherine [name]  has presented today for surgery, with the diagnosis of GERD,screening colonoscopy.  The various methods of treatment have been discussed with the patient and family. After consideration of risks, benefits and other options for treatment, the patient has consented to  Procedure(s) with comments: COLONOSCOPY WITH PROPOFOL (N/A) - 8:45 am ASA 2 ESOPHAGOGASTRODUODENOSCOPY (EGD) WITH PROPOFOL (N/A) as a surgical intervention.  The patient's history has been reviewed, patient examined, no change in status, stable for surgery.  I have reviewed the patient's chart and labs.  Questions were answered to the patient's satisfaction.     [name]

## 2022-06-22 LAB — SURGICAL PATHOLOGY

## 2022-06-23 ENCOUNTER — Encounter (HOSPITAL_BASED_OUTPATIENT_CLINIC_OR_DEPARTMENT_OTHER): Payer: Self-pay | Admitting: Gynecologic Oncology

## 2022-06-23 ENCOUNTER — Other Ambulatory Visit: Payer: Self-pay | Admitting: Gynecologic Oncology

## 2022-06-23 ENCOUNTER — Telehealth: Payer: Self-pay

## 2022-06-23 ENCOUNTER — Encounter: Payer: Self-pay | Admitting: Gynecologic Oncology

## 2022-06-23 DIAGNOSIS — C539 Malignant neoplasm of cervix uteri, unspecified: Secondary | ICD-10-CM

## 2022-06-23 MED ORDER — TRAMADOL HCL 50 MG PO TABS: 50.0000 mg | ORAL_TABLET | Freq: Four times a day (QID) | ORAL | 0 refills | Status: DC | PRN

## 2022-06-23 MED ORDER — SENNOSIDES-DOCUSATE SODIUM 8.6-50 MG PO TABS: 2.0000 | ORAL_TABLET | Freq: Every day | ORAL | 0 refills | Status: DC

## 2022-06-23 NOTE — Progress Notes (Signed)
Post-op meds sent in preop. Prescription strength ibuprofen not ordered due to increased risk of GI bleed with zoloft and ibup use.

## 2022-06-23 NOTE — Telephone Encounter (Signed)
LM for Ms Huertas stating that her pre-op  appointment over the phone with Joylene John, NP is scheduled for 2 pm 06-29-22. Melissa sent her her pre-op instructions via MyChart message. Her post op appointments are scheduled for a phone visit on 07-07-22 at 4 pm and then a post op in the office with Dr. Berline Lopes on 07-29-22. She can call the office  at [phone] if she has any questions or concerns.

## 2022-06-23 NOTE — Telephone Encounter (Signed)
Spoke with Katherine [name]. She is fine with having the cold knife conization on 06-30-22 with Dr. Berline Lopes. This information given to Joylene John, NP who will schedule the procedure.

## 2022-06-23 NOTE — Progress Notes (Signed)
Spoke w/ via phone for pre-op interview--- pt Lab needs dos----   no            Lab results------ no COVID test -----patient states asymptomatic no test needed Arrive at ------- 1300 on 06-30-2022 NPO after MN NO Solid Food.  Clear liquids from MN until--- 1200 Med rec completed Medications to take morning of surgery ----- zoloft, prilosec Diabetic medication ----- n/a Patient instructed no nail polish to be worn day of surgery Patient instructed to bring photo id and insurance card day of surgery Patient aware to have Driver (ride ) / caregiver for 24 hours after surgery --- daughter, Doroteo Bradford Patient Special Instructions ----- asked to bring rescue inhaler Pre-Op special Istructions ----- n/a Patient verbalized understanding of instructions that were given at this phone interview. Patient denies shortness of breath, chest pain, fever, cough at this phone interview.

## 2022-06-28 ENCOUNTER — Telehealth: Payer: Self-pay

## 2022-06-28 ENCOUNTER — Encounter (HOSPITAL_COMMUNITY): Payer: Self-pay | Admitting: Internal Medicine

## 2022-06-28 NOTE — Telephone Encounter (Signed)
Telephone call to check on pre-operative status.  Patient verbalized pre-operative instructions provided by RN from [facility].  Reinforced nothing to eat after midnight. Clear liquids until 12 PM. Patient to arrive at 1 PM.  No questions or concerns voiced.  Instructed to call for any needs.

## 2022-06-28 NOTE — Addendum Note (Signed)
Addendum  created 06/28/22 1128 by Myna Bright, CRNA   Intraprocedure Staff edited

## 2022-06-29 ENCOUNTER — Telehealth: Payer: Self-pay | Admitting: Gynecologic Oncology

## 2022-06-29 ENCOUNTER — Inpatient Hospital Stay (HOSPITAL_BASED_OUTPATIENT_CLINIC_OR_DEPARTMENT_OTHER): Payer: Medicare HMO | Admitting: Gynecologic Oncology

## 2022-06-29 ENCOUNTER — Encounter (HOSPITAL_COMMUNITY)
Admission: RE | Admit: 2022-06-29 | Discharge: 2022-06-29 | Disposition: A | Discharge status: Home / Self Care | Payer: Medicare HMO | Source: Ambulatory Visit | Attending: Gynecologic Oncology | Admitting: Gynecologic Oncology

## 2022-06-29 DIAGNOSIS — R918 Other nonspecific abnormal finding of lung field: Secondary | ICD-10-CM | POA: Diagnosis not present

## 2022-06-29 DIAGNOSIS — C539 Malignant neoplasm of cervix uteri, unspecified: Secondary | ICD-10-CM | POA: Insufficient documentation

## 2022-06-29 LAB — GLUCOSE, CAPILLARY: Glucose-Capillary: 98 mg/dL (ref 70–99)

## 2022-06-29 MED ORDER — FLUDEOXYGLUCOSE F - 18 (FDG) INJECTION
7.4000 | Freq: Once | INTRAVENOUS | Status: AC
  Administered 2022-06-29: 6.92 via INTRAVENOUS

## 2022-06-29 NOTE — Telephone Encounter (Signed)
Called patient to check in. No needs voiced.

## 2022-06-30 ENCOUNTER — Other Ambulatory Visit: Payer: Self-pay

## 2022-06-30 ENCOUNTER — Encounter (HOSPITAL_BASED_OUTPATIENT_CLINIC_OR_DEPARTMENT_OTHER)
Admission: RE | Disposition: A | Discharge status: Home / Self Care | Payer: Self-pay | Source: Home / Self Care | Attending: Gynecologic Oncology

## 2022-06-30 ENCOUNTER — Ambulatory Visit (HOSPITAL_BASED_OUTPATIENT_CLINIC_OR_DEPARTMENT_OTHER): Payer: Medicare HMO | Admitting: Anesthesiology

## 2022-06-30 ENCOUNTER — Encounter (HOSPITAL_BASED_OUTPATIENT_CLINIC_OR_DEPARTMENT_OTHER): Payer: Self-pay | Admitting: Gynecologic Oncology

## 2022-06-30 ENCOUNTER — Ambulatory Visit (HOSPITAL_BASED_OUTPATIENT_CLINIC_OR_DEPARTMENT_OTHER)
Admission: RE | Admit: 2022-06-30 | Discharge: 2022-06-30 | Disposition: A | Discharge status: Home / Self Care | Payer: Medicare HMO | Attending: Gynecologic Oncology | Admitting: Gynecologic Oncology

## 2022-06-30 DIAGNOSIS — K449 Diaphragmatic hernia without obstruction or gangrene: Secondary | ICD-10-CM | POA: Diagnosis not present

## 2022-06-30 DIAGNOSIS — C530 Malignant neoplasm of endocervix: Secondary | ICD-10-CM | POA: Insufficient documentation

## 2022-06-30 DIAGNOSIS — F419 Anxiety disorder, unspecified: Secondary | ICD-10-CM | POA: Insufficient documentation

## 2022-06-30 DIAGNOSIS — D069 Carcinoma in situ of cervix, unspecified: Secondary | ICD-10-CM

## 2022-06-30 DIAGNOSIS — F32A Depression, unspecified: Secondary | ICD-10-CM | POA: Insufficient documentation

## 2022-06-30 DIAGNOSIS — I10 Essential (primary) hypertension: Secondary | ICD-10-CM | POA: Diagnosis not present

## 2022-06-30 DIAGNOSIS — K219 Gastro-esophageal reflux disease without esophagitis: Secondary | ICD-10-CM | POA: Diagnosis not present

## 2022-06-30 DIAGNOSIS — C539 Malignant neoplasm of cervix uteri, unspecified: Secondary | ICD-10-CM | POA: Diagnosis not present

## 2022-06-30 DIAGNOSIS — J45909 Unspecified asthma, uncomplicated: Secondary | ICD-10-CM | POA: Diagnosis not present

## 2022-06-30 DIAGNOSIS — Z01818 Encounter for other preprocedural examination: Secondary | ICD-10-CM

## 2022-06-30 HISTORY — DX: Generalized anxiety disorder: F41.1

## 2022-06-30 HISTORY — DX: Presence of dental prosthetic device (complete) (partial): Z97.2

## 2022-06-30 HISTORY — PX: CERVICAL CONIZATION W/BX: SHX1330

## 2022-06-30 HISTORY — DX: Presence of spectacles and contact lenses: Z97.3

## 2022-06-30 HISTORY — DX: Unspecified asthma, uncomplicated: J45.909

## 2022-06-30 HISTORY — DX: Diaphragmatic hernia without obstruction or gangrene: K44.9

## 2022-06-30 SURGERY — CONE BIOPSY, CERVIX
Anesthesia: General | Site: Cervix

## 2022-06-30 MED ORDER — HEMOSTATIC AGENTS (NO CHARGE) OPTIME
TOPICAL | Status: DC | PRN
  Administered 2022-06-30: 1

## 2022-06-30 MED ORDER — GABAPENTIN 300 MG PO CAPS
ORAL_CAPSULE | ORAL | Status: AC
  Filled 2022-06-30: qty 1

## 2022-06-30 MED ORDER — MIDAZOLAM HCL 2 MG/2ML IJ SOLN
INTRAMUSCULAR | Status: AC
  Filled 2022-06-30: qty 2

## 2022-06-30 MED ORDER — FENTANYL CITRATE (PF) 100 MCG/2ML IJ SOLN
25.0000 ug | INTRAMUSCULAR | Status: DC | PRN
  Administered 2022-06-30: 25 ug via INTRAVENOUS

## 2022-06-30 MED ORDER — LABETALOL HCL 5 MG/ML IV SOLN
INTRAVENOUS | Status: AC
  Filled 2022-06-30: qty 4

## 2022-06-30 MED ORDER — LIDOCAINE HCL 1 % IJ SOLN
INTRAMUSCULAR | Status: DC | PRN
  Administered 2022-06-30: 10 mL

## 2022-06-30 MED ORDER — LACTATED RINGERS IV SOLN: INTRAVENOUS | Status: DC

## 2022-06-30 MED ORDER — ACETAMINOPHEN 500 MG PO TABS
1000.0000 mg | ORAL_TABLET | ORAL | Status: AC
  Administered 2022-06-30: 1000 mg via ORAL

## 2022-06-30 MED ORDER — LABETALOL HCL 5 MG/ML IV SOLN
10.0000 mg | Freq: Once | INTRAVENOUS | Status: AC
  Administered 2022-06-30: 5 mg via INTRAVENOUS

## 2022-06-30 MED ORDER — LIDOCAINE 2% (20 MG/ML) 5 ML SYRINGE
INTRAMUSCULAR | Status: DC | PRN
  Administered 2022-06-30: 30 mg via INTRAVENOUS

## 2022-06-30 MED ORDER — FENTANYL CITRATE (PF) 100 MCG/2ML IJ SOLN
INTRAMUSCULAR | Status: DC | PRN
  Administered 2022-06-30: 25 ug via INTRAVENOUS
  Administered 2022-06-30: 50 ug via INTRAVENOUS
  Administered 2022-06-30: 25 ug via INTRAVENOUS

## 2022-06-30 MED ORDER — ACETAMINOPHEN 160 MG/5ML PO SOLN: 325.0000 mg | ORAL | Status: DC | PRN

## 2022-06-30 MED ORDER — MIDAZOLAM HCL 2 MG/2ML IJ SOLN
INTRAMUSCULAR | Status: DC | PRN
  Administered 2022-06-30: 2 mg via INTRAVENOUS

## 2022-06-30 MED ORDER — PROPOFOL 10 MG/ML IV BOLUS
INTRAVENOUS | Status: DC | PRN
  Administered 2022-06-30: 160 mg via INTRAVENOUS

## 2022-06-30 MED ORDER — CELECOXIB 200 MG PO CAPS
ORAL_CAPSULE | ORAL | Status: AC
  Filled 2022-06-30: qty 1

## 2022-06-30 MED ORDER — ACETAMINOPHEN 325 MG PO TABS: 325.0000 mg | ORAL_TABLET | ORAL | Status: DC | PRN

## 2022-06-30 MED ORDER — OXYCODONE HCL 5 MG PO TABS: 5.0000 mg | ORAL_TABLET | Freq: Once | ORAL | Status: DC | PRN

## 2022-06-30 MED ORDER — DEXAMETHASONE SODIUM PHOSPHATE 10 MG/ML IJ SOLN: 4.0000 mg | INTRAMUSCULAR | Status: DC

## 2022-06-30 MED ORDER — DEXAMETHASONE SODIUM PHOSPHATE 10 MG/ML IJ SOLN
INTRAMUSCULAR | Status: DC | PRN
  Administered 2022-06-30: 5 mg via INTRAVENOUS

## 2022-06-30 MED ORDER — FENTANYL CITRATE (PF) 100 MCG/2ML IJ SOLN
INTRAMUSCULAR | Status: AC
  Filled 2022-06-30: qty 2

## 2022-06-30 MED ORDER — ACETAMINOPHEN 500 MG PO TABS
ORAL_TABLET | ORAL | Status: AC
  Filled 2022-06-30: qty 2

## 2022-06-30 MED ORDER — ONDANSETRON HCL 4 MG/2ML IJ SOLN
INTRAMUSCULAR | Status: DC | PRN
  Administered 2022-06-30: 4 mg via INTRAVENOUS

## 2022-06-30 MED ORDER — ACETAMINOPHEN 10 MG/ML IV SOLN: 1000.0000 mg | Freq: Once | INTRAVENOUS | Status: DC | PRN

## 2022-06-30 MED ORDER — CELECOXIB 200 MG PO CAPS
200.0000 mg | ORAL_CAPSULE | ORAL | Status: AC
  Administered 2022-06-30: 200 mg via ORAL

## 2022-06-30 MED ORDER — SCOPOLAMINE 1 MG/3DAYS TD PT72
MEDICATED_PATCH | TRANSDERMAL | Status: AC
  Filled 2022-06-30: qty 1

## 2022-06-30 MED ORDER — FERRIC SUBSULFATE (BULK) SOLN
Status: DC | PRN
  Administered 2022-06-30: 1

## 2022-06-30 MED ORDER — PROMETHAZINE HCL 25 MG/ML IJ SOLN: 6.2500 mg | INTRAMUSCULAR | Status: DC | PRN

## 2022-06-30 MED ORDER — 0.9 % SODIUM CHLORIDE (POUR BTL) OPTIME
TOPICAL | Status: DC | PRN
  Administered 2022-06-30: 500 mL

## 2022-06-30 MED ORDER — AMISULPRIDE (ANTIEMETIC) 5 MG/2ML IV SOLN: 10.0000 mg | Freq: Once | INTRAVENOUS | Status: DC | PRN

## 2022-06-30 MED ORDER — OXYCODONE HCL 5 MG/5ML PO SOLN: 5.0000 mg | Freq: Once | ORAL | Status: DC | PRN

## 2022-06-30 MED ORDER — LIDOCAINE HCL (PF) 2 % IJ SOLN
INTRAMUSCULAR | Status: AC
  Filled 2022-06-30: qty 5

## 2022-06-30 MED ORDER — GABAPENTIN 300 MG PO CAPS
300.0000 mg | ORAL_CAPSULE | ORAL | Status: AC
  Administered 2022-06-30: 300 mg via ORAL

## 2022-06-30 MED ORDER — SCOPOLAMINE 1 MG/3DAYS TD PT72
1.0000 | MEDICATED_PATCH | TRANSDERMAL | Status: DC
  Administered 2022-06-30: 1.5 mg via TRANSDERMAL

## 2022-06-30 SURGICAL SUPPLY — 27 items
APL SWBSTK 6 STRL LF DISP (MISCELLANEOUS) ×1
APPLICATOR COTTON TIP 6 STRL (MISCELLANEOUS) ×1 IMPLANT
APPLICATOR COTTON TIP 6IN STRL (MISCELLANEOUS) ×1
BLADE EXTENDED COATED 6.5IN (ELECTRODE) ×1 IMPLANT
BLADE SURG 11 STRL SS (BLADE) ×1 IMPLANT
CATH ROBINSON RED A/P 16FR (CATHETERS) IMPLANT
DRSG TELFA 3X8 NADH STRL (GAUZE/BANDAGES/DRESSINGS) ×1 IMPLANT
ELECT BALL LEEP 5MM RED (ELECTRODE) IMPLANT
GAUZE 4X4 16PLY ~~LOC~~+RFID DBL (SPONGE) ×2 IMPLANT
GLOVE BIO SURGEON STRL SZ 6 (GLOVE) ×1 IMPLANT
GOWN STRL REUS W/TWL LRG LVL3 (GOWN DISPOSABLE) ×1 IMPLANT
HEMOSTAT SURGICEL 4X8 (HEMOSTASIS) ×1 IMPLANT
KIT TURNOVER CYSTO (KITS) ×1 IMPLANT
NS IRRIG 500ML POUR BTL (IV SOLUTION) ×1 IMPLANT
PACK VAGINAL WOMENS (CUSTOM PROCEDURE TRAY) ×1 IMPLANT
PAD PREP 24X48 CUFFED NSTRL (MISCELLANEOUS) ×1 IMPLANT
SPONGE SURGIFOAM ABS GEL 12-7 (HEMOSTASIS) IMPLANT
SUT VIC AB 0 CT1 27 (SUTURE) ×1
SUT VIC AB 0 CT1 27XBRD ANTBC (SUTURE) IMPLANT
SUT VIC AB 0 CT1 36 (SUTURE) ×2 IMPLANT
SUT VIC AB 2-0 SH 27 (SUTURE)
SUT VIC AB 2-0 SH 27XBRD (SUTURE) IMPLANT
SUT VIC AB 2-0 UR6 27 (SUTURE) IMPLANT
SWAB OB GYN 8IN STERILE 2PK (MISCELLANEOUS) ×1 IMPLANT
SYR BULB IRRIG 60ML STRL (SYRINGE) ×1 IMPLANT
TOWEL OR 17X26 10 PK STRL BLUE (TOWEL DISPOSABLE) ×1 IMPLANT
TUBE CONNECTING 12X1/4 (SUCTIONS) ×1 IMPLANT

## 2022-06-30 NOTE — Anesthesia Procedure Notes (Signed)
Procedure Name: LMA Insertion Date/Time: 06/30/2022 2:49 PM  Performed by: Suan Halter, CRNAPre-anesthesia Checklist: Patient identified, Emergency Drugs available, Suction available and Patient being monitored Patient Re-evaluated:Patient Re-evaluated prior to induction Oxygen Delivery Method: Circle system utilized Preoxygenation: Pre-oxygenation with 100% oxygen Induction Type: IV induction Ventilation: Mask ventilation without difficulty LMA: LMA inserted LMA Size: 4.0 Number of attempts: 1 Airway Equipment and Method: Bite block Placement Confirmation: positive ETCO2 Tube secured with: Tape Dental Injury: Teeth and Oropharynx as per pre-operative assessment

## 2022-06-30 NOTE — Anesthesia Preprocedure Evaluation (Addendum)
Anesthesia Evaluation  Patient identified by MRN, date of birth, ID band Patient awake    Reviewed: Allergy & Precautions, NPO status , Patient's Chart, lab work & pertinent test results  Airway Mallampati: I  TM Distance: >3 FB Neck ROM: Full    Dental  (+) Upper Dentures, Dental Advisory Given   Pulmonary asthma ,    breath sounds clear to auscultation       Cardiovascular hypertension,  Rhythm:Regular Rate:Normal     Neuro/Psych PSYCHIATRIC DISORDERS Anxiety Depression    GI/Hepatic Neg liver ROS, hiatal hernia, GERD  ,  Endo/Other  negative endocrine ROS  Renal/GU negative Renal ROS     Musculoskeletal   Abdominal Normal abdominal exam  (+)   Peds  Hematology negative hematology ROS (+)   Anesthesia Other Findings   Reproductive/Obstetrics                            Anesthesia Physical Anesthesia Plan  ASA: 2  Anesthesia Plan: General   Post-op Pain Management:    Induction: Intravenous  PONV Risk Score and Plan: 4 or greater and Ondansetron, Dexamethasone, Midazolam and Scopolamine patch - Pre-op  Airway Management Planned: LMA  Additional Equipment: None  Intra-op Plan:   Post-operative Plan: Extubation in OR  Informed Consent: I have reviewed the patients History and Physical, chart, labs and discussed the procedure including the risks, benefits and alternatives for the proposed anesthesia with the patient or authorized representative who has indicated his/her understanding and acceptance.       Plan Discussed with: CRNA  Anesthesia Plan Comments:        Anesthesia Quick Evaluation

## 2022-06-30 NOTE — Transfer of Care (Signed)
Immediate Anesthesia Transfer of Care Note  Patient: Melodee Lupe  Procedure(s) Performed: Procedure(s) (LRB): CONIZATION CERVIX WITH BIOPSY; POST CONE ENDOCERVICAL CURRETTAGE (N/A)  Patient Location: PACU  Anesthesia Type: General  Level of Consciousness: awake, oriented, sedated and patient cooperative  Airway & Oxygen Therapy: Patient Spontanous Breathing and Patient connected to face mask oxygen  Post-op Assessment: Report given to PACU RN and Post -op Vital signs reviewed and stable  Post vital signs: Reviewed and stable  Complications: No apparent anesthesia complications  Last Vitals:  Vitals Value Taken Time  BP 160/78 06/30/22 1539  Temp    Pulse 63 06/30/22 1540  Resp 14 06/30/22 1540  SpO2 100 % 06/30/22 1540  Vitals shown include unvalidated device data.  Last Pain:  Vitals:   06/30/22 1315  TempSrc: Oral  PainSc: 0-No pain      Patients Stated Pain Goal: 4 (57/97/28 2060)  Complications: No notable events documented.

## 2022-06-30 NOTE — Anesthesia Postprocedure Evaluation (Signed)
Anesthesia Post Note  Patient: Katherine [name]  Procedure(s) Performed: CONIZATION CERVIX WITH BIOPSY; POST CONE ENDOCERVICAL CURRETTAGE (Cervix)     Patient location during evaluation: PACU Anesthesia Type: General Level of consciousness: awake and alert Pain management: pain level controlled Vital Signs Assessment: post-procedure vital signs reviewed and stable Respiratory status: spontaneous breathing, nonlabored ventilation, respiratory function stable and patient connected to nasal cannula oxygen Cardiovascular status: blood pressure returned to baseline and stable Postop Assessment: no apparent nausea or vomiting Anesthetic complications: no   No notable events documented.  Last Vitals:  Vitals:   06/30/22 1539 06/30/22 1545  BP: (!) 160/78 (!) 173/87  Pulse: 65 63  Resp: 14 16  Temp: 36.4 C   SpO2: 100% 100%    Last Pain:  Vitals:   06/30/22 1545  TempSrc:   PainSc: 0-No pain                 Nimsi Males S

## 2022-06-30 NOTE — Progress Notes (Signed)
Readjusted BP cuff

## 2022-06-30 NOTE — Op Note (Signed)
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 Small mobile uterus. On speculum exam, mildly friable endocervical tissue, no distinct visible lesions.   Procedure: The patient was identified in the preoperative holding area. Informed consent was signed on the chart. Patient was seen history was reviewed and exam was performed.   The patient was then taken to the operating room and placed in the supine position with SCD hose on. General anesthesia was then induced without difficulty. She was then placed in the dorsolithotomy position. The perineum was prepped with Betadine. The vagina was prepped with Betadine. The patient was then draped after the prep was dried.   Timeout was performed the patient, procedure, antibiotic, allergy, and length of procedure.   The weighted speculum was placed in the posterior vagina. The right angle retractor was placed anteriorally to visualize the cervix. A 0-vicryl suture on a UR-6 needle was used to place stay sutures (which were tagged) at 3 and 9 o'clock of the cervicovaginal junction. 10cc of 1% lidocaine was infiltrated into 4 and 8 o'clock of the cervicovaginal junction. The uterine sound was placed in the cervix to delineate the course of the endocervical canal. An 11 blade scalpel on a long knife handle was used to make an incision around the face of the cervix, inside of the stay sutures, circumferentially. An Donne Hazel was  used to grasp the specimen to manipulate it. The incision was then angled towards the endocervix to amputate the specimen. It was removed, oriented with a marking stitch at the 12 o'clock ectocervix and sent to pathology. It was measured to be 2cm deep, by 2x2cm on the ectocervical face.   A post-cone ECC was collected from the endocervical canal using a kavorkian currette.  The bovie was used at 67 coag to create hemostasis at the surgical bed. A running, locked baseball stitch was then used with 0 Vicryl along the posterior aspect of the cone bed to help achieve hemostasis. Monsels solution was applied to the surgical bed to consolidate this hemostasis.  Surgicel was placed within the cone bed and pressure held. With good hemostasis noted, a second piece of Surgicel was placed over the cone bed and stay sutures were loosely tied down across the cervix to one another, over the Surgical.   The vagina was irrigated.  All instrument, suture, laparotomy, Ray-Tec, and needle counts were correct x2. The patient tolerated the procedure well and was taken recovery room in stable condition.   [name], MD

## 2022-06-30 NOTE — Discharge Instructions (Addendum)
AFTER SURGERY INSTRUCTIONS   Return to work: variable based on occupation  You may notice a piece of gauze like-material come out of the vagina over the next several days. This is NORMAL and is used during surgery to help stop bleeding at the cervix. It is also normal to see brown to yellow discharge from the other medications used during surgery to stop bleeding.   Activity: 1. Be up and out of the bed during the day.  Take a nap if needed.  You may walk up steps but be careful and use the hand rail.  Stair climbing will tire you more than you think, you may need to stop part way and rest.    2. No lifting or straining for 4 weeks over 10 pounds. No pushing, pulling, straining for 4 weeks.   3. No driving for minimum 24 hours after surgery, this is usually longer since you need to be off pain meds and able to brake safely.  Do not drive if you are taking narcotic pain medicine and make sure that your reaction time has returned.    4. You can shower as soon as the next day after surgery. Shower daily. No tub baths or submerging your body in water until cleared by your surgeon. If you have the soap that was given to you by pre-surgical testing that was used before surgery, you do not need to use it afterwards because this can irritate your incisions.    5. No sexual activity and nothing in the vagina for 4 weeks.   6. You may experience vaginal spotting and discharge after surgery.  The spotting is normal but if you experience heavy bleeding, call our office.   7. Take Tylenol or ibuprofen first for pain if you are able to take these medications and only use narcotic pain medication for severe pain not relieved by the Tylenol or Ibuprofen.  Monitor your Tylenol intake to a max of 4,000 mg in a 24 hour period. You can alternate these medications after surgery.   Diet: 1. Low sodium Heart Healthy Diet is recommended but you are cleared to resume your normal (before surgery) diet after your  procedure.   2. It is safe to use a laxative, such as Miralax or Colace, if you have difficulty moving your bowels. You have been prescribed Sennakot at bedtime every evening to keep bowel movements regular and to prevent constipation.     Wound Care: 1. Keep clean and dry.  Shower daily.   Reasons to call the Doctor: Fever - Oral temperature greater than 100.4 degrees Fahrenheit Foul-smelling vaginal discharge Difficulty urinating Nausea and vomiting Increased pain at the site of the incision that is unrelieved with pain medicine. Difficulty breathing with or without chest pain New calf pain especially if only on one side Sudden, continuing increased vaginal bleeding with or without clots.   Contacts: For questions or concerns you should contact:   Dr. Jeral Pinch at [phone]   Joylene John, NP at [phone]   After Hours: call [phone] and have the GYN Oncologist paged/contacted (after 5 pm or on the weekends).   Messages sent via mychart are for non-urgent matters and are not responded to after hours so for urgent needs, please call the after hours number.  No tylenol until 7:18 p.m.   Post Anesthesia Home Care Instructions  Activity: Get plenty of rest for the remainder of the day. A responsible individual must stay with you for 24 hours following the procedure.  For the next 24 hours, DO NOT: -Drive a car -Paediatric nurse -Drink alcoholic beverages -Take any medication unless instructed by your physician -Make any legal decisions or sign important papers.  Meals: Start with liquid foods such as gelatin or soup. Progress to regular foods as tolerated. Avoid greasy, spicy, heavy foods. If nausea and/or vomiting occur, drink only clear liquids until the nausea and/or vomiting subsides. Call your physician if vomiting continues.  Special Instructions/Symptoms: Your throat may feel dry or sore from the anesthesia or the breathing tube placed in your  throat during surgery. If this causes discomfort, gargle with warm salt water. The discomfort should disappear within 24 hours.  If you had a scopolamine patch placed behind your ear for the management of post- operative nausea and/or vomiting:  1. The medication in the patch is effective for 72 hours, after which it should be removed.  Wrap patch in a tissue and discard in the trash. Wash hands thoroughly with soap and water. 2. You may remove the patch earlier than 72 hours if you experience unpleasant side effects which may include dry mouth, dizziness or visual disturbances. 3. Avoid touching the patch. Wash your hands with soap and water after contact with the patch.

## 2022-06-30 NOTE — Interval H&P Note (Signed)
History and Physical Interval Note:  06/30/2022 1:22 PM  Katherine [name]  has presented today for surgery, with the diagnosis of CERVICAL CANCER.  The various methods of treatment have been discussed with the patient and family. After consideration of risks, benefits and other options for treatment, the patient has consented to  Procedure(s): CONIZATION CERVIX WITH BIOPSY; POST CONE ENDOCERVICAL CURRETTAGE (N/A) as a surgical intervention.  The patient's history has been reviewed, patient examined, no change in status, stable for surgery.  I have reviewed the patient's chart and labs.  Questions were answered to the patient's satisfaction.     [name]

## 2022-07-01 ENCOUNTER — Telehealth: Payer: Self-pay

## 2022-07-01 ENCOUNTER — Encounter (HOSPITAL_BASED_OUTPATIENT_CLINIC_OR_DEPARTMENT_OTHER): Payer: Self-pay | Admitting: Gynecologic Oncology

## 2022-07-01 NOTE — Progress Notes (Signed)
Pre-op instructions reviewed with patient over the phone by Kimberly Martinique CMA. Patient called at the end of the day to answer any questions. No needs voiced. Instructions for pre-op and post-op sent in patient in mychart.

## 2022-07-01 NOTE — Telephone Encounter (Signed)
Spoke with Katherine [name] this morning. She states she is eating, drinking and urinating well. She has not had a BM yet but is passing gas. She is taking senokot as prescribed and encouraged her to drink plenty of water. She is having slight vaginal bleeding. She denies fever or chills. She rates her pain 0/10. Her pain is controlled with Tylenol.    Instructed to call office with any fever, chills, purulent drainage, uncontrolled pain or any other questions or concerns. Patient verbalizes understanding.   Pt aware of post op appointments as well as the office number [phone] and after hours number [phone] to call if she has any questions or concerns

## 2022-07-05 ENCOUNTER — Encounter: Payer: Self-pay | Admitting: Gynecologic Oncology

## 2022-07-05 LAB — SURGICAL PATHOLOGY

## 2022-07-05 NOTE — Progress Notes (Signed)
Rhonda with [facility] pathology notified of need for tumor measurements and LVI from recent conization.

## 2022-07-07 ENCOUNTER — Inpatient Hospital Stay: Payer: Medicare HMO | Attending: Gynecologic Oncology | Admitting: Gynecologic Oncology

## 2022-07-07 ENCOUNTER — Encounter: Payer: Self-pay | Admitting: Gynecologic Oncology

## 2022-07-07 ENCOUNTER — Encounter: Payer: Medicare HMO | Admitting: Gastroenterology

## 2022-07-07 DIAGNOSIS — C539 Malignant neoplasm of cervix uteri, unspecified: Secondary | ICD-10-CM

## 2022-07-07 DIAGNOSIS — Z7189 Other specified counseling: Secondary | ICD-10-CM

## 2022-07-07 DIAGNOSIS — D069 Carcinoma in situ of cervix, unspecified: Secondary | ICD-10-CM

## 2022-07-07 NOTE — Progress Notes (Signed)
Gynecologic Oncology Telehealth Note: Gyn-Onc  I connected with Katherine [name] on 07/07/22 at  4:00 PM EDT by telephone and verified that I am speaking with the correct person using two identifiers.  I discussed the limitations, risks, security and privacy concerns of performing an evaluation and management service by telemedicine and the availability of in-person appointments. I also discussed with the patient that there may be a patient responsible charge related to this service. The patient expressed understanding and agreed to proceed.  Other persons participating in the visit and their role in the encounter: none.  Patient's location: home Provider's location: [facility]  Reason for Visit: Follow-up surgery, treatment discussion  Treatment History: Oncology History Overview Note  Pap test was performed on 05/23/22 and showed squamous cell carcinoma, + HPV 16.   Malignant neoplasm of cervix ([place])  06/07/2022 Initial Diagnosis   Malignant neoplasm of cervix ([place])   06/17/2022 Initial Biopsy   Cervical biopsies, ECC  A. 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 CERVIX, 12:00, BIOPSY:  Microinvasive squamous cell carcinoma (0.25 mm depth) arising within  severe dysplasia to carcinoma in situ  Marked chronic and follicular cervicitis with squamous metaplasia   C. ENDOCERVICAL, CURRETAGE:  Numerous detached fragments of squamous cell carcinoma and severe  squamous dysplasia    06/29/2022 Imaging   PET: 1. Very low-grade activity in the vicinity of the cervix, maximum SUV 4.1, possibly reflecting the cervical malignancy. No findings of metastatic disease. 2. Small but hypermetabolic paratracheal, bilateral hilar/infrahilar, and subcarinal lymph nodes, at least one of which is partially calcified, raising some suspicion for granulomatous process  such as sarcoidosis, although strictly speaking, lymphoma is not excluded. 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 3. Accentuated palatine tonsillar uptake bilaterally, probably physiologic but technically nonspecific. There is also a small but mildly hypermetabolic left level IIa lymph node with maximum SUV of 4.5. 4. Other imaging findings of potential clinical significance: Acute on chronic paranasal sinusitis. Mild cardiomegaly. Simple appearing adnexal cysts warrant no further workup.   06/30/2022 Surgery   [facility], ECC  A. CERVIX, CONE:  - Invasive moderately differentiated squamous cell carcinoma arising in  background of extensive carcinoma in situ involving underlying  endocervical glands  - Invasive carcinoma is present in 6-9 and 9-12 o'clock quadrants while  12-3 o'clock and 3-6 o'clock quadrants show presence of carcinoma in  situ  - Carcinoma invades for a depth of at least 0.5 cm (6-9 o'clock  quadrant)  - The deep resection margin is widely positive for carcinoma (6-9  o'clock quadrant)  - All lateral mucosal margins are negative for invasive carcinoma  - Ecto- and endocervical margins in 12-3 o'clock, 3-6 o'clock and 6-9  o'clock quadrants are positive for carcinoma in situ; mucosal margins in  the 9-12 o'clock quadrants are negative   B. ENDOCERVIX, POST CONE, CURETTAGE:  - At least squamous cell carcinoma in situ  - Focal invasive carcinoma cannot be ruled out      Interval History: Doing very well. Has very light brown spotting. Denies cramps/pain. Reports good bowel and bladder function.  Past Medical/Surgical History: Past Medical History:  Diagnosis Date   Asthma, mild    followed  by pcp   Depression    GAD (generalized anxiety disorder)    GERD  (gastroesophageal reflux disease)    Hiatal hernia    History of obstruction of ureter 2005   s/p  left ureteral stent (per pt not due to stone,  resolved)   Malignant neoplasm cervix ([place]) 06/07/2022   SCC   Wears dentures    full upper   Wears glasses     Past Surgical History:  Procedure Laterality Date   CERVICAL CONIZATION W/BX N/A 06/30/2022   Procedure: CONIZATION CERVIX WITH BIOPSY; POST CONE ENDOCERVICAL CURRETTAGE;  Surgeon: [name], MD;  Location[facility];  Service: Gynecology;  Laterality: N/A;   CESAREAN SECTION  1993   COLONOSCOPY WITH PROPOFOL  06/21/2022   COLONOSCOPY WITH PROPOFOL N/A 06/21/2022   Procedure: COLONOSCOPY WITH PROPOFOL;  Surgeon: [name], DO;  [facility];  Service: Endoscopy;  Laterality: N/A;  8:45 am ASA 2   CYSTOSCOPY WITH RETROGRADE PYELOGRAM, URETEROSCOPY AND STENT PLACEMENT Left 2005   for left upj obstructions (not due to stone)   ESOPHAGOGASTRODUODENOSCOPY (EGD) WITH PROPOFOL N/A 06/21/2022   Procedure: ESOPHAGOGASTRODUODENOSCOPY (EGD) WITH PROPOFOL;  Surgeon: [name], DO;  Location: [facility];  Service: Endoscopy;  Laterality: N/A;   LAPAROSCOPIC TUBAL LIGATION Bilateral 1998   ORIF ANKLE FRACTURE Right 2010   has retained hardware   POLYPECTOMY  06/21/2022   Procedure: POLYPECTOMY;  Surgeon: [name], DO;  Location: [facility];  Service: Endoscopy;;    Family History  Problem Relation Age of Onset   Hypothyroidism Mother    Hypertension Mother    Anxiety disorder Mother    Arthritis Mother    Hypertension Father    Stroke Father    Hypothyroidism Sister    ADD / ADHD Sister    Asthma Sister    ADD / ADHD Sister    ADD / ADHD Sister    ADD / ADHD Brother    ADD / ADHD Brother    Colon cancer Brother    Depression Daughter    Depression Daughter    Colon polyps Neg Hx    Breast cancer Neg Hx    Ovarian cancer Neg Hx    Endometrial cancer Neg Hx     Pancreatic cancer Neg Hx    Prostate cancer Neg Hx     Social History   Socioeconomic History   Marital status: Divorced    Spouse name: Not on file   Number of children: 4   Years of education: Not on file   Highest education level: 10th grade  Occupational History   Not on file  Tobacco Use   Smoking status: Never   Smokeless tobacco: Never  Vaping Use   Vaping Use: Never used  Substance and Sexual Activity   Alcohol use: Not Currently    Comment: seldom   Drug use: Never   Sexual activity: Not on file  Other Topics Concern   Not on file  Social History Narrative   Lives alone   4 children-one in [place], [place], [place], and [place] in the [place] children 2      Enjoy: spending time with family, children visit often       Diets: eats all food groups    Caffeine: 1 cup coffee twice week   Water: 6-8 cups      Wears seat belt    Does not use phone while  driving-handfree   Smoke detectors at home   No weapons in the house    Social Determinants of Health   Financial Resource Strain: Medium Risk (04/25/2022)   Overall Financial Resource Strain (CARDIA)    Difficulty of Paying Living Expenses: Somewhat hard  Food Insecurity: Food Insecurity Present (04/25/2022)   Hunger Vital Sign    Worried About Running Out of Food in the Last Year: Often true    Ran Out of Food in the Last Year: Often true  Transportation Needs: No Transportation Needs (04/25/2022)   PRAPARE - Hydrologist (Medical): No    Lack of Transportation (Non-Medical): No  Physical Activity: Inactive (04/25/2022)   Exercise Vital Sign    Days of Exercise per Week: 0 days    Minutes of Exercise per Session: 0 min  Stress: Stress Concern Present (04/25/2022)   [place]    Feeling of Stress : Rather much  Social Connections: Unknown (04/25/2022)   Social Connection and Isolation Panel [NHANES]     Frequency of Communication with Friends and Family: Once a week    Frequency of Social Gatherings with Friends and Family: Once a week    Attends Religious Services: Not on Advertising copywriter or Organizations: No    Attends Archivist Meetings: Never    Marital Status: Divorced    Current Medications:  Current Outpatient Medications:    albuterol (VENTOLIN HFA) 108 (90 Base) MCG/ACT inhaler, Inhale 2 puffs into the lungs every 6 (six) hours as needed for wheezing or shortness of breath., Disp: , Rfl:    cholecalciferol (VITAMIN D3) 25 MCG (1000 UNIT) tablet, Take 1,000 Units by mouth daily., Disp: , Rfl:    cyanocobalamin (VITAMIN B12) 1000 MCG tablet, Take 1,000 mcg by mouth daily., Disp: , Rfl:    Magnesium 250 MG TABS, Take 1 tablet by mouth daily., Disp: , Rfl:    omeprazole (PRILOSEC) 20 MG capsule, TAKE 1 CAPSULE BY MOUTH EVERY DAY (Patient taking differently: Take 20 mg by mouth daily.), Disp: 90 capsule, Rfl: 1   Probiotic Product (PROBIOTIC DAILY PO), Take 1 capsule by mouth daily., Disp: , Rfl:    senna-docusate (SENOKOT-S) 8.6-50 MG tablet, Take 2 tablets by mouth at bedtime. For AFTER surgery, do not take if having diarrhea (Patient not taking: Reported on 06/23/2022), Disp: 30 tablet, Rfl: 0   sertraline (ZOLOFT) 25 MG tablet, Take 1 tablet (25 mg total) by mouth daily. (Patient taking differently: Take 25 mg by mouth daily.), Disp: 30 tablet, Rfl: 3   traMADol (ULTRAM) 50 MG tablet, Take 1 tablet (50 mg total) by mouth every 6 (six) hours as needed for severe pain. For AFTER surgery only, do not take and drive (Patient not taking: Reported on 06/23/2022), Disp: 5 tablet, Rfl: 0   vitamin C (ASCORBIC ACID) 250 MG tablet, Take 250 mg by mouth daily., Disp: , Rfl:   Review of Symptoms: Pertinent positives as per HPI.  Physical Exam: Deferred given limitations of phone visit.  Laboratory & Radiologic Studies: See above  Assessment & Plan: Katherine [name] is a  56 y.o. 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  Patient is doing well, meeting postoperative milestones.  Discussed continued expectations.  Reviewed pathology from recent cold knife cone.  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  Given one of the endocervical margins is positive for carcinoma, patient does not meet conservative surgery criteria.  Discussed plan for radical hysterectomy with lymph node assessment.  We will discuss this further at her postoperative visit which will be at the end of October.  Tentatively we will plan on surgery either 11 10/8 or 11/15 which will be just shy 6 or 7 weeks postop from her cone.  I discussed the assessment and treatment plan with the patient. The patient was provided with an opportunity to ask questions and all were answered. The patient agreed with the plan and demonstrated an understanding of the instructions.   The patient was advised to call back or see an in-person evaluation if the symptoms worsen or if the condition fails to improve as anticipated.   8 minutes of total time was spent for this patient encounter, including preparation, phone counseling with the patient and coordination of care, and documentation of the encounter.   Jeral Pinch, MD  Division of Gynecologic Oncology  Department of Obstetrics and Gynecology  [facility]

## 2022-07-08 ENCOUNTER — Other Ambulatory Visit: Payer: Self-pay | Admitting: Gynecologic Oncology

## 2022-07-08 DIAGNOSIS — C539 Malignant neoplasm of cervix uteri, unspecified: Secondary | ICD-10-CM

## 2022-07-15 ENCOUNTER — Other Ambulatory Visit: Payer: Self-pay | Admitting: Internal Medicine

## 2022-07-15 DIAGNOSIS — F339 Major depressive disorder, recurrent, unspecified: Secondary | ICD-10-CM

## 2022-07-21 ENCOUNTER — Encounter: Payer: Medicare HMO | Admitting: Gastroenterology

## 2022-07-29 ENCOUNTER — Inpatient Hospital Stay (HOSPITAL_BASED_OUTPATIENT_CLINIC_OR_DEPARTMENT_OTHER): Payer: Medicare HMO | Admitting: Gynecologic Oncology

## 2022-07-29 ENCOUNTER — Encounter: Payer: Self-pay | Admitting: Gynecologic Oncology

## 2022-07-29 VITALS — BP 179/87 | HR 61 | Temp 98.6°F

## 2022-07-29 VITALS — BP 155/82 | Ht 60.0 in | Wt 135.6 lb

## 2022-07-29 DIAGNOSIS — C539 Malignant neoplasm of cervix uteri, unspecified: Secondary | ICD-10-CM

## 2022-07-29 DIAGNOSIS — Z7189 Other specified counseling: Secondary | ICD-10-CM

## 2022-07-29 MED ORDER — SENNOSIDES-DOCUSATE SODIUM 8.6-50 MG PO TABS: 2.0000 | ORAL_TABLET | Freq: Every day | ORAL | 0 refills | Status: DC

## 2022-07-29 MED ORDER — OXYCODONE HCL 5 MG PO TABS: 5.0000 mg | ORAL_TABLET | ORAL | 0 refills | Status: DC | PRN

## 2022-07-29 NOTE — Progress Notes (Addendum)
Gynecologic Oncology Return Clinic Visit  07/29/22  Reason for Visit: post-op follow-up, treatment planning and counseling  Treatment History: Oncology History Overview Note  Pap test was performed on 05/23/22 and showed squamous cell carcinoma, + HPV 16.   Malignant neoplasm of cervix ([place])  06/07/2022 Initial Diagnosis   Malignant neoplasm of cervix ([place])   06/17/2022 Initial Biopsy   Cervical biopsies, ECC  A. 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 CERVIX, 12:00, BIOPSY:  Microinvasive squamous cell carcinoma (0.25 mm depth) arising within  severe dysplasia to carcinoma in situ  Marked chronic and follicular cervicitis with squamous metaplasia   C. ENDOCERVICAL, CURRETAGE:  Numerous detached fragments of squamous cell carcinoma and severe  squamous dysplasia    06/29/2022 Imaging   PET: 1. Very low-grade activity in the vicinity of the cervix, maximum SUV 4.1, possibly reflecting the cervical malignancy. No findings of metastatic disease. 2. Small but hypermetabolic paratracheal, bilateral hilar/infrahilar, and subcarinal lymph nodes, at least one of which is partially calcified, raising some suspicion for granulomatous process such as sarcoidosis, although strictly speaking, lymphoma is not excluded. 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 3. Accentuated palatine tonsillar uptake bilaterally, probably physiologic but technically nonspecific. There is also a small but mildly hypermetabolic left level IIa lymph node with maximum SUV of 4.5. 4. Other imaging findings of potential clinical significance: Acute on chronic paranasal  sinusitis. Mild cardiomegaly. Simple appearing adnexal cysts warrant no further workup.   06/30/2022 Surgery   [facility], ECC  A. CERVIX, CONE:  - Invasive moderately differentiated squamous cell carcinoma arising in  background of extensive carcinoma in situ involving underlying  endocervical glands  - Invasive carcinoma is present in 6-9 and 9-12 o'clock quadrants while  12-3 o'clock and 3-6 o'clock quadrants show presence of carcinoma in  situ  - Carcinoma invades for a depth of at least 0.5 cm (6-9 o'clock  quadrant)  - The deep resection margin is widely positive for carcinoma (6-9  o'clock quadrant)  - All lateral mucosal margins are negative for invasive carcinoma  - Ecto- and endocervical margins in 12-3 o'clock, 3-6 o'clock and 6-9  o'clock quadrants are positive for carcinoma in situ; mucosal margins in  the 9-12 o'clock quadrants are negative   B. ENDOCERVIX, POST CONE, CURETTAGE:  - At least squamous cell carcinoma in situ  - Focal invasive carcinoma cannot be ruled out      Interval History: Doing well.  Reports that bleeding postop stopped completely about 10 days after surgery.  Denies any significant discharge since then.  Denies any pelvic pain.  Reports regular bowel and bladder function.  Past Medical/Surgical History: Past Medical History:  Diagnosis Date   Asthma, mild    followed by pcp   Depression    GAD (generalized anxiety disorder)    GERD (gastroesophageal reflux disease)    Hiatal hernia    History of obstruction of ureter 2005   s/p  left ureteral stent (per pt not due to stone,  resolved)   Malignant neoplasm cervix ([place]) 06/07/2022   SCC   Wears dentures    full upper   Wears glasses     Past Surgical History:  Procedure Laterality Date   CERVICAL CONIZATION W/BX N/A 06/30/2022   Procedure: CONIZATION CERVIX WITH BIOPSY; POST CONE ENDOCERVICAL CURRETTAGE;  Surgeon: [name], MD;  Location: [facility];  Service:  Gynecology;  Laterality: N/A;   CESAREAN SECTION  1993   COLONOSCOPY WITH PROPOFOL  06/21/2022   COLONOSCOPY WITH PROPOFOL N/A 06/21/2022   Procedure: COLONOSCOPY WITH PROPOFOL;  Surgeon: [name], DO;  Location: [facility];  Service: Endoscopy;  Laterality: N/A;  8:45 am ASA 2   CYSTOSCOPY WITH RETROGRADE PYELOGRAM, URETEROSCOPY AND STENT PLACEMENT Left 2005   for left upj obstructions (not due to stone)   ESOPHAGOGASTRODUODENOSCOPY (EGD) WITH PROPOFOL N/A 06/21/2022   Procedure: ESOPHAGOGASTRODUODENOSCOPY (EGD) WITH PROPOFOL;  Surgeon: [name], DO;  Location: [facility];  Service: Endoscopy;  Laterality: N/A;   LAPAROSCOPIC TUBAL LIGATION Bilateral 1998   ORIF ANKLE FRACTURE Right 2010   has retained hardware   POLYPECTOMY  06/21/2022   Procedure: POLYPECTOMY;  Surgeon: [name], DO;  Location: [facility];  Service: Endoscopy;;    Family History  Problem Relation Age of Onset   Hypothyroidism Mother    Hypertension Mother    Anxiety disorder Mother    Arthritis Mother    Hypertension Father    Stroke Father    Hypothyroidism Sister    ADD / ADHD Sister    Asthma Sister    ADD / ADHD Sister    ADD / ADHD Sister    ADD / ADHD Brother    ADD / ADHD Brother    Colon cancer Brother    Depression Daughter    Depression Daughter    Colon polyps Neg Hx    Breast cancer Neg Hx    Ovarian cancer Neg Hx    Endometrial cancer Neg Hx    Pancreatic cancer Neg Hx    Prostate cancer Neg Hx     Social History   Socioeconomic History   Marital status: Divorced    Spouse name: Not on file   Number of children: 4   Years of education: Not on file   Highest education level: 10th grade  Occupational History   Not on file  Tobacco Use   Smoking status: Never   Smokeless tobacco: Never  Vaping Use   Vaping Use: Never used  Substance and Sexual Activity   Alcohol use: Not Currently    Comment: seldom   Drug use: Never   Sexual activity: Not on  file  Other Topics Concern   Not on file  Social History Narrative   Lives alone   4 children-one in [place], [place], [place], and [place] in the [place] children 2      Enjoy: spending time with family, children visit often       Diets: eats all food groups    Caffeine: 1 cup coffee twice week   Water: 6-8 cups      Wears seat belt    Does not use phone while driving-handfree   Smoke detectors at home   No weapons in the house    Social Determinants of Health   Financial Resource Strain: Medium Risk (04/25/2022)   Overall Financial Resource Strain (CARDIA)    Difficulty of Paying Living Expenses: Somewhat hard  Food Insecurity: Food Insecurity Present (04/25/2022)   Hunger Vital Sign    Worried About Running Out of Food in the Last Year: Often true    Ran Out of  Food in the Last Year: Often true  Transportation Needs: No Transportation Needs (04/25/2022)   PRAPARE - Hydrologist (Medical): No    Lack of Transportation (Non-Medical): No  Physical Activity: Inactive (04/25/2022)   Exercise Vital Sign    Days of Exercise per Week: 0 days    Minutes of Exercise per Session: 0 min  Stress: Stress Concern Present (04/25/2022)   [place]    Feeling of Stress : Rather much  Social Connections: Unknown (04/25/2022)   Social Connection and Isolation Panel [NHANES]    Frequency of Communication with Friends and Family: Once a week    Frequency of Social Gatherings with Friends and Family: Once a week    Attends Religious Services: Not on Advertising copywriter or Organizations: No    Attends Archivist Meetings: Never    Marital Status: Divorced    Current Medications:  Current Outpatient Medications:    albuterol (VENTOLIN HFA) 108 (90 Base) MCG/ACT inhaler, Inhale 2 puffs into the lungs every 6 (six) hours as needed for wheezing or shortness of breath.,  Disp: , Rfl:    cholecalciferol (VITAMIN D3) 25 MCG (1000 UNIT) tablet, Take 1,000 Units by mouth daily., Disp: , Rfl:    cyanocobalamin (VITAMIN B12) 1000 MCG tablet, Take 1,000 mcg by mouth daily., Disp: , Rfl:    Magnesium 250 MG TABS, Take 1 tablet by mouth daily., Disp: , Rfl:    omeprazole (PRILOSEC) 20 MG capsule, TAKE 1 CAPSULE BY MOUTH EVERY DAY (Patient taking differently: Take 20 mg by mouth daily.), Disp: 90 capsule, Rfl: 1   oxyCODONE (OXY IR/ROXICODONE) 5 MG immediate release tablet, Take 1 tablet (5 mg total) by mouth every 4 (four) hours as needed for severe pain. For AFTER surgery only, do not take and drive, Disp: 15 tablet, Rfl: 0   Probiotic Product (PROBIOTIC DAILY PO), Take 1 capsule by mouth daily., Disp: , Rfl:    senna-docusate (SENOKOT-S) 8.6-50 MG tablet, Take 2 tablets by mouth at bedtime. For AFTER surgery, do not take if having diarrhea, Disp: 30 tablet, Rfl: 0   sertraline (ZOLOFT) 25 MG tablet, TAKE 1 TABLET (25 MG TOTAL) BY MOUTH DAILY., Disp: 90 tablet, Rfl: 2   vitamin C (ASCORBIC ACID) 250 MG tablet, Take 250 mg by mouth daily., Disp: , Rfl:   Review of Systems: Denies appetite changes, fevers, chills, fatigue, unexplained weight changes. Denies hearing loss, neck lumps or masses, mouth sores, ringing in ears or voice changes. Denies cough or wheezing.  Denies shortness of breath. Denies chest pain or palpitations. Denies leg swelling. Denies abdominal distention, pain, blood in stools, constipation, diarrhea, nausea, vomiting, or early satiety. Denies pain with intercourse, dysuria, frequency, hematuria or incontinence. Denies hot flashes, pelvic pain, vaginal bleeding or vaginal discharge.   Denies joint pain, back pain or muscle pain/cramps. Denies itching, rash, or wounds. Denies dizziness, headaches, numbness or seizures. Denies swollen lymph nodes or glands, denies easy bruising or bleeding. Denies anxiety, depression, confusion, or decreased  concentration.  Physical Exam: BP (!) 179/87 (BP Location: Right Arm)   Pulse 61   Temp 98.6 F (37 C) (Oral)   SpO2 100%  Repeat manual bp: 155/82 General: Alert, oriented, no acute distress. HEENT: Normocephalic, atraumatic, sclera anicteric. Chest: Clear to auscultation bilaterally.  No wheezes or rhonchi. Cardiovascular: Regular rate and rhythm, no murmurs. Abdomen: soft, nontender.  Normoactive bowel sounds.  No masses or hepatosplenomegaly appreciated.   Extremities: Grossly normal range of motion.  Warm, well perfused.  No edema bilaterally. Skin: No rashes or lesions noted. Lymphatics: No cervical, supraclavicular, or inguinal adenopathy. GU: Normal appearing external genitalia without erythema, excoriation, or lesions.  Speculum exam reveals well-healing cone bed, no visible lesions on the cervix.  Bimanual exam reveals mild firmness of the cervix consistent with recent procedure.    Laboratory & Radiologic Studies: See above  Assessment & Plan: Katherine [name] is a 56 y.o. 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  Patient is doing well, meeting postoperative milestones.  Discussed continued expectations.   Reviewed pathology from recent cold knife cone.  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  Given one of the endocervical margins is positive for carcinoma, patient does not meet conservative surgery criteria.  Discussed plan for radical hysterectomy with lymph node assessment.  Is tentatively scheduled in a couple of weeks.  The patient was counseled extensively on the treatment options for an early cervical cancer including radical hysterectomy with BSO and lymph node assessment versus chemo-radiation with daily external beam radiation followed by intracavitary brachytherapy. We discussed equal cure rates and survival with each modality.  Given pathology report from her cone, I believe that  she is an excellent candidate for surgery.  After counseling and consideration of her options, the patient desires surgical management.   I extensively reviewed the risks of radical hysterectomy and lymph node dissection including infection, bleeding, damage to surrounding structures including bowel, bladder, vessels, and ureters. We discussed postoperative risks including risk of bowel and bladder dysfunction and lymphedema. We also reviewed possible need for further treatment such as radiation or chemotherapy based on final pathology. I counseled her on need for foley catheter x 7-10 days and possible risk of prolonged catheterization if unable to void, as well as possible permanent bladder dysfunction.  She was given the opportunity to ask questions, which were answered to her satisfaction, and she is agreement with the above mentioned plan of care.   We reviewed the plan for a radical hysterectomy, bilateral salpingo-oophorectomy, pelvic lymphadenectomy, any other indicated procedures. The risks of surgery were discussed in detail  as outline above and she understands these to include infection; wound separation; hernia; vaginal cuff separation, injury to adjacent organs such as bowel, bladder, blood vessels, ureters and nerves; bleeding which may require blood transfusion; anesthesia risk; thromboembolic events; possible death; unforeseen complications; possible need for re-exploration; medical complications such as heart attack, stroke, pleural effusion and pneumonia; and, if full lymphadenectomy is performed the risk of lymphedema and lymphocyst. The patient will receive DVT and antibiotic prophylaxis as indicated. She voiced a clear understanding. She had the opportunity to ask questions. Perioperative instructions were reviewed with her. Prescriptions for post-op medications were sent to her pharmacy of choice.  32 minutes of total time was spent for this patient encounter, including preparation,  face-to-face counseling with the patient and coordination of care, and documentation of the encounter.  Jeral Pinch, MD  Division of Gynecologic Oncology  Department of Obstetrics and Gynecology  [facility]

## 2022-07-29 NOTE — Patient Instructions (Addendum)
Preparing for your Surgery  Plan for surgery on August 10, 2022 with Dr. Jeral Pinch at [place] will be scheduled for open radical hysterectomy, bilateral salpingo-oophorectomy (removal of both ovaries and fallopian tubes), pelvic lymph node dissection.   Pre-operative Testing -You will receive a phone call from presurgical testing at [facility] to arrange for a pre-operative appointment and lab work.  -Bring your insurance card, copy of an advanced directive if applicable, medication list  -At that visit, you will be asked to sign a consent for a possible blood transfusion in case a transfusion becomes necessary during surgery.  The need for a blood transfusion is rare but having consent is a necessary part of your care.     -You should not be taking blood thinners or aspirin at least ten days prior to surgery unless instructed by your surgeon.  -Do not take supplements such as fish oil (omega 3), red yeast rice, turmeric before your surgery. You want to avoid medications with aspirin in them including headache powders such as BC or Goody's), Excedrin migraine.  Day Before Surgery at [place] will be asked to take in a light diet the day before surgery. You will be advised you can have clear liquids up until 3 hours before your surgery.    Eat a light diet the day before surgery.  Examples including soups, broths, toast, yogurt, mashed potatoes.  AVOID GAS PRODUCING FOODS. Things to avoid include carbonated beverages (fizzy beverages, sodas), raw fruits and raw vegetables (uncooked), or beans.   If your bowels are filled with gas, your surgeon will have difficulty visualizing your pelvic organs which increases your surgical risks.  Your role in recovery Your role is to become active as soon as directed by your doctor, while still giving yourself time to heal.  Rest when you feel tired. You will be asked to do the following in order to speed your  recovery:  - Cough and breathe deeply. This helps to clear and expand your lungs and can prevent pneumonia after surgery.  - [place]. Do mild physical activity. Walking or moving your legs help your circulation and body functions return to normal. Do not try to get up or walk alone the first time after surgery.   -If you develop swelling on one leg or the other, pain in the back of your leg, redness/warmth in one of your legs, please call the office or go to the Emergency Room to have a doppler to rule out a blood clot. For shortness of breath, chest pain-seek care in the Emergency Room as soon as possible. - Actively manage your pain. Managing your pain lets you move in comfort. We will ask you to rate your pain on a scale of zero to 10. It is your responsibility to tell your doctor or nurse where and how much you hurt so your pain can be treated.  Special Considerations -If you are diabetic, you may be placed on insulin after surgery to have closer control over your blood sugars to promote healing and recovery.  This does not mean that you will be discharged on insulin.  If applicable, your oral antidiabetics will be resumed when you are tolerating a solid diet.  -Your final pathology results from surgery should be available around one week after surgery and the results will be relayed to you when available.  -Dr. Lahoma Crocker is the surgeon that assists your GYN Oncologist with surgery.  If you end up staying the night, the next day after your surgery you will either see Dr. Berline Lopes, Dr. Ernestina Patches, or Dr. Lahoma Crocker.  -FMLA forms can be faxed to [phone] and please allow 5-7 business days for completion.  Pain Management After Surgery -You have been prescribed your pain medication and bowel regimen medications before surgery so that you can have these available when you are discharged from the hospital. The pain medication is for use ONLY AFTER surgery and a  new prescription will not be given.   -Make sure that you have Tylenol and Ibuprofen IF YOU ARE ABLE TO TAKE THESE MEDICATIONS at home to use on a regular basis after surgery for pain control. We recommend alternating the medications every hour to six hours since they work differently and are processed in the body differently for pain relief.  -Review the attached handout on narcotic use and their risks and side effects.   Bowel Regimen -You have been prescribed Sennakot-S to take nightly to prevent constipation especially if you are taking the narcotic pain medication intermittently.  It is important to prevent constipation and drink adequate amounts of liquids. You can stop taking this medication when you are not taking pain medication and you are back on your normal bowel routine.  Risks of Surgery Risks of surgery are low but include bleeding, infection, damage to surrounding structures, re-operation, blood clots, and very rarely death.   Blood Transfusion Information (For the consent to be signed before surgery)  We will be checking your blood type before surgery so in case of emergencies, we will know what type of blood you would need.                                            WHAT IS A BLOOD TRANSFUSION?  A transfusion is the replacement of blood or some of its parts. Blood is made up of multiple cells which provide different functions. Red blood cells carry oxygen and are used for blood loss replacement. White blood cells fight against infection. Platelets control bleeding. Plasma helps clot blood. Other blood products are available for specialized needs, such as hemophilia or other clotting disorders. BEFORE THE TRANSFUSION  Who gives blood for transfusions?  You may be able to donate blood to be used at a later date on yourself (autologous donation). Relatives can be asked to donate blood. This is generally not any safer than if you have received blood from a stranger. The same  precautions are taken to ensure safety when a relative's blood is donated. Healthy volunteers who are fully evaluated to make sure their blood is safe. This is blood bank blood. Transfusion therapy is the safest it has ever been in the practice of medicine. Before blood is taken from a donor, a complete history is taken to make sure that person has no history of diseases nor engages in risky social behavior (examples are intravenous drug use or sexual activity with multiple partners). The donor's travel history is screened to minimize risk of transmitting infections, such as malaria. The donated blood is tested for signs of infectious diseases, such as HIV and hepatitis. The blood is then tested to be sure it is compatible with you in order to minimize the chance of a transfusion reaction. If you or a relative donates blood, this is often done in anticipation of surgery and is  not appropriate for emergency situations. It takes many days to process the donated blood. RISKS AND COMPLICATIONS Although transfusion therapy is very safe and saves many lives, the main dangers of transfusion include:  Getting an infectious disease. Developing a transfusion reaction. This is an allergic reaction to something in the blood you were given. Every precaution is taken to prevent this. The decision to have a blood transfusion has been considered carefully by your caregiver before blood is given. Blood is not given unless the benefits outweigh the risks.  AFTER SURGERY INSTRUCTIONS  Return to work: 4-6 weeks if applicable  Plan to have a foley catheter in place after surgery for one week. We will see you back in the office after that for bladder training and probable removal.  You will have a white honeycomb dressing over your larger incision. This dressing can be removed 5 days after surgery and you do not need to reapply a new dressing. Once you remove the dressing, you will notice that you have the surgical glue  (dermabond) on the incision and this will peel off on its own. You can get this dressing wet in the shower the days after surgery prior to removal on the 5th day.   Activity: 1. Be up and out of the bed during the day.  Take a nap if needed.  You may walk up steps but be careful and use the hand rail.  Stair climbing will tire you more than you think, you may need to stop part way and rest.   2. No lifting or straining for 6 weeks over 10 pounds. No pushing, pulling, straining for 6 weeks.  3. No driving for around 2 week(s).  Do not drive if you are taking narcotic pain medicine and make sure that your reaction time has returned.   4. You can shower as soon as the next day after surgery. Shower daily.  Use your regular soap and water (not directly on the incision) and pat your incision(s) dry afterwards; don't rub.  No tub baths or submerging your body in water until cleared by your surgeon. If you have the soap that was given to you by pre-surgical testing that was used before surgery, you do not need to use it afterwards because this can irritate your incisions.   5. No sexual activity and nothing in the vagina for 8-10 weeks.  6. You may experience a small amount of clear drainage from your incision, which is normal.  If the drainage persists, increases, or changes color please call the office.  7. Do not use creams, lotions, or ointments such as neosporin on your incision after surgery until advised by your surgeon because they can cause removal of the dermabond glue on your incisions.    8. You may experience vaginal spotting after surgery or around the 6-8 week mark from surgery when the stitches at the top of the vagina begin to dissolve.  The spotting is normal but if you experience heavy bleeding, call our office.  9. Take Tylenol or ibuprofen first for pain if you are able to take these medications and only use narcotic pain medication for severe pain not relieved by the Tylenol or  Ibuprofen.  Monitor your Tylenol intake to a max of 4,000 mg in a 24 hour period. You can alternate these medications after surgery.  Diet: 1. Low sodium Heart Healthy Diet is recommended but you are cleared to resume your normal (before surgery) diet after your procedure.  2.  It is safe to use a laxative, such as Miralax or Colace, if you have difficulty moving your bowels. You have been prescribed Sennakot-S to take at bedtime every evening after surgery to keep bowel movements regular and to prevent constipation.    Wound Care: 1. Keep clean and dry.  Shower daily.  Reasons to call the Doctor: Fever - Oral temperature greater than 100.4 degrees Fahrenheit Foul-smelling vaginal discharge Difficulty urinating Nausea and vomiting Increased pain at the site of the incision that is unrelieved with pain medicine. Difficulty breathing with or without chest pain New calf pain especially if only on one side Sudden, continuing increased vaginal bleeding with or without clots.   Contacts: For questions or concerns you should contact:  Dr. Jeral Pinch at [phone]  Joylene John, NP at [phone]  After Hours: call [phone] and have the GYN Oncologist paged/contacted (after 5 pm or on the weekends).  Messages sent via mychart are for non-urgent matters and are not responded to after hours so for urgent needs, please call the after hours number.

## 2022-07-29 NOTE — H&P (View-Only) (Signed)
Gynecologic Oncology Return Clinic Visit  07/29/22  Reason for Visit: post-op follow-up, treatment planning and counseling  Treatment History: Oncology History Overview Note  Pap test was performed on 05/23/22 and showed squamous cell carcinoma, + HPV 16.   Malignant neoplasm of cervix ([place])  06/07/2022 Initial Diagnosis   Malignant neoplasm of cervix ([place])   06/17/2022 Initial Biopsy   Cervical biopsies, ECC  A. 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 CERVIX, 12:00, BIOPSY:  Microinvasive squamous cell carcinoma (0.25 mm depth) arising within  severe dysplasia to carcinoma in situ  Marked chronic and follicular cervicitis with squamous metaplasia   C. ENDOCERVICAL, CURRETAGE:  Numerous detached fragments of squamous cell carcinoma and severe  squamous dysplasia    06/29/2022 Imaging   PET: 1. Very low-grade activity in the vicinity of the cervix, maximum SUV 4.1, possibly reflecting the cervical malignancy. No findings of metastatic disease. 2. Small but hypermetabolic paratracheal, bilateral hilar/infrahilar, and subcarinal lymph nodes, at least one of which is partially calcified, raising some suspicion for granulomatous process such as sarcoidosis, although strictly speaking, lymphoma is not excluded. 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 3. Accentuated palatine tonsillar uptake bilaterally, probably physiologic but technically nonspecific. There is also a small but mildly hypermetabolic left level IIa lymph node with maximum SUV of 4.5. 4. Other imaging findings of potential clinical significance: Acute on chronic paranasal  sinusitis. Mild cardiomegaly. Simple appearing adnexal cysts warrant no further workup.   06/30/2022 Surgery   [facility], ECC  A. CERVIX, CONE:  - Invasive moderately differentiated squamous cell carcinoma arising in  background of extensive carcinoma in situ involving underlying  endocervical glands  - Invasive carcinoma is present in 6-9 and 9-12 o'clock quadrants while  12-3 o'clock and 3-6 o'clock quadrants show presence of carcinoma in  situ  - Carcinoma invades for a depth of at least 0.5 cm (6-9 o'clock  quadrant)  - The deep resection margin is widely positive for carcinoma (6-9  o'clock quadrant)  - All lateral mucosal margins are negative for invasive carcinoma  - Ecto- and endocervical margins in 12-3 o'clock, 3-6 o'clock and 6-9  o'clock quadrants are positive for carcinoma in situ; mucosal margins in  the 9-12 o'clock quadrants are negative   B. ENDOCERVIX, POST CONE, CURETTAGE:  - At least squamous cell carcinoma in situ  - Focal invasive carcinoma cannot be ruled out      Interval History: Doing well.  Reports that bleeding postop stopped completely about 10 days after surgery.  Denies any significant discharge since then.  Denies any pelvic pain.  Reports regular bowel and bladder function.  Past Medical/Surgical History: Past Medical History:  Diagnosis Date   Asthma, mild    followed by pcp   Depression    GAD (generalized anxiety disorder)    GERD (gastroesophageal reflux disease)    Hiatal hernia    History of obstruction of ureter 2005   s/p  left ureteral stent (per pt not due to stone,  resolved)   Malignant neoplasm cervix ([place]) 06/07/2022   SCC   Wears dentures    full upper   Wears glasses     Past Surgical History:  Procedure Laterality Date   CERVICAL CONIZATION W/BX N/A 06/30/2022   Procedure: CONIZATION CERVIX WITH BIOPSY; POST CONE ENDOCERVICAL CURRETTAGE;  Surgeon: [name], MD;  Location: [facility];  Service:  Gynecology;  Laterality: N/A;   CESAREAN SECTION  1993   COLONOSCOPY WITH PROPOFOL  06/21/2022   COLONOSCOPY WITH PROPOFOL N/A 06/21/2022   Procedure: COLONOSCOPY WITH PROPOFOL;  Surgeon: [name], DO;  [facility];  Service: Endoscopy;  Laterality: N/A;  8:45 am ASA 2   CYSTOSCOPY WITH RETROGRADE PYELOGRAM, URETEROSCOPY AND STENT PLACEMENT Left 2005   for left upj obstructions (not due to stone)   ESOPHAGOGASTRODUODENOSCOPY (EGD) WITH PROPOFOL N/A 06/21/2022   Procedure: ESOPHAGOGASTRODUODENOSCOPY (EGD) WITH PROPOFOL;  Surgeon: [name], DO;  [facility];  Service: Endoscopy;  Laterality: N/A;   LAPAROSCOPIC TUBAL LIGATION Bilateral 1998   ORIF ANKLE FRACTURE Right 2010   has retained hardware   POLYPECTOMY  06/21/2022   Procedure: POLYPECTOMY;  Surgeon: [name], DO;  [facility];  Service: Endoscopy;;    Family History  Problem Relation Age of Onset   Hypothyroidism Mother    Hypertension Mother    Anxiety disorder Mother    Arthritis Mother    Hypertension Father    Stroke Father    Hypothyroidism Sister    ADD / ADHD Sister    Asthma Sister    ADD / ADHD Sister    ADD / ADHD Sister    ADD / ADHD Brother    ADD / ADHD Brother    Colon cancer Brother    Depression Daughter    Depression Daughter    Colon polyps Neg Hx    Breast cancer Neg Hx    Ovarian cancer Neg Hx    Endometrial cancer Neg Hx    Pancreatic cancer Neg Hx    Prostate cancer Neg Hx     Social History   Socioeconomic History   Marital status: Divorced    Spouse name: Not on file   Number of children: 4   Years of education: Not on file   Highest education level: 10th grade  Occupational History   Not on file  Tobacco Use   Smoking status: Never   Smokeless tobacco: Never  Vaping Use   Vaping Use: Never used  Substance and Sexual Activity   Alcohol use: Not Currently    Comment: seldom   Drug use: Never   Sexual activity: Not on  file  Other Topics Concern   Not on file  Social History Narrative   Lives alone   4 children-one in [place], [place], [place], and [place] in the [place] children 2      Enjoy: spending time with family, children visit often       Diets: eats all food groups    Caffeine: 1 cup coffee twice week   Water: 6-8 cups      Wears seat belt    Does not use phone while driving-handfree   Smoke detectors at home   No weapons in the house    Social Determinants of Health   Financial Resource Strain: Medium Risk (04/25/2022)   Overall Financial Resource Strain (CARDIA)    Difficulty of Paying Living Expenses: Somewhat hard  Food Insecurity: Food Insecurity Present (04/25/2022)   Hunger Vital Sign    Worried About Running Out of Food in the Last Year: Often true    Ran Out of  Food in the Last Year: Often true  Transportation Needs: No Transportation Needs (04/25/2022)   PRAPARE - Hydrologist (Medical): No    Lack of Transportation (Non-Medical): No  Physical Activity: Inactive (04/25/2022)   Exercise Vital Sign    Days of Exercise per Week: 0 days    Minutes of Exercise per Session: 0 min  Stress: Stress Concern Present (04/25/2022)   [place]    Feeling of Stress : Rather much  Social Connections: Unknown (04/25/2022)   Social Connection and Isolation Panel [NHANES]    Frequency of Communication with Friends and Family: Once a week    Frequency of Social Gatherings with Friends and Family: Once a week    Attends Religious Services: Not on Advertising copywriter or Organizations: No    Attends Archivist Meetings: Never    Marital Status: Divorced    Current Medications:  Current Outpatient Medications:    albuterol (VENTOLIN HFA) 108 (90 Base) MCG/ACT inhaler, Inhale 2 puffs into the lungs every 6 (six) hours as needed for wheezing or shortness of breath.,  Disp: , Rfl:    cholecalciferol (VITAMIN D3) 25 MCG (1000 UNIT) tablet, Take 1,000 Units by mouth daily., Disp: , Rfl:    cyanocobalamin (VITAMIN B12) 1000 MCG tablet, Take 1,000 mcg by mouth daily., Disp: , Rfl:    Magnesium 250 MG TABS, Take 1 tablet by mouth daily., Disp: , Rfl:    omeprazole (PRILOSEC) 20 MG capsule, TAKE 1 CAPSULE BY MOUTH EVERY DAY (Patient taking differently: Take 20 mg by mouth daily.), Disp: 90 capsule, Rfl: 1   oxyCODONE (OXY IR/ROXICODONE) 5 MG immediate release tablet, Take 1 tablet (5 mg total) by mouth every 4 (four) hours as needed for severe pain. For AFTER surgery only, do not take and drive, Disp: 15 tablet, Rfl: 0   Probiotic Product (PROBIOTIC DAILY PO), Take 1 capsule by mouth daily., Disp: , Rfl:    senna-docusate (SENOKOT-S) 8.6-50 MG tablet, Take 2 tablets by mouth at bedtime. For AFTER surgery, do not take if having diarrhea, Disp: 30 tablet, Rfl: 0   sertraline (ZOLOFT) 25 MG tablet, TAKE 1 TABLET (25 MG TOTAL) BY MOUTH DAILY., Disp: 90 tablet, Rfl: 2   vitamin C (ASCORBIC ACID) 250 MG tablet, Take 250 mg by mouth daily., Disp: , Rfl:   Review of Systems: Denies appetite changes, fevers, chills, fatigue, unexplained weight changes. Denies hearing loss, neck lumps or masses, mouth sores, ringing in ears or voice changes. Denies cough or wheezing.  Denies shortness of breath. Denies chest pain or palpitations. Denies leg swelling. Denies abdominal distention, pain, blood in stools, constipation, diarrhea, nausea, vomiting, or early satiety. Denies pain with intercourse, dysuria, frequency, hematuria or incontinence. Denies hot flashes, pelvic pain, vaginal bleeding or vaginal discharge.   Denies joint pain, back pain or muscle pain/cramps. Denies itching, rash, or wounds. Denies dizziness, headaches, numbness or seizures. Denies swollen lymph nodes or glands, denies easy bruising or bleeding. Denies anxiety, depression, confusion, or decreased  concentration.  Physical Exam: BP (!) 179/87 (BP Location: Right Arm)   Pulse 61   Temp 98.6 F (37 C) (Oral)   SpO2 100%  Repeat manual bp: 155/82 General: Alert, oriented, no acute distress. HEENT: Normocephalic, atraumatic, sclera anicteric. Chest: Clear to auscultation bilaterally.  No wheezes or rhonchi. Cardiovascular: Regular rate and rhythm, no murmurs. Abdomen: soft, nontender.  Normoactive bowel sounds.  No masses or hepatosplenomegaly appreciated.   Extremities: Grossly normal range of motion.  Warm, well perfused.  No edema bilaterally. Skin: No rashes or lesions noted. Lymphatics: No cervical, supraclavicular, or inguinal adenopathy. GU: Normal appearing external genitalia without erythema, excoriation, or lesions.  Speculum exam reveals well-healing cone bed, no visible lesions on the cervix.  Bimanual exam reveals mild firmness of the cervix consistent with recent procedure.    Laboratory & Radiologic Studies: See above  Assessment & Plan: Katherine [name] is a 56 y.o. 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  Patient is doing well, meeting postoperative milestones.  Discussed continued expectations.   Reviewed pathology from recent cold knife cone.  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  Given one of the endocervical margins is positive for carcinoma, patient does not meet conservative surgery criteria.  Discussed plan for radical hysterectomy with lymph node assessment.  Is tentatively scheduled in a couple of weeks.  The patient was counseled extensively on the treatment options for an early cervical cancer including radical hysterectomy with BSO and lymph node assessment versus chemo-radiation with daily external beam radiation followed by intracavitary brachytherapy. We discussed equal cure rates and survival with each modality.  Given pathology report from her cone, I believe that  she is an excellent candidate for surgery.  After counseling and consideration of her options, the patient desires surgical management.   I extensively reviewed the risks of radical hysterectomy and lymph node dissection including infection, bleeding, damage to surrounding structures including bowel, bladder, vessels, and ureters. We discussed postoperative risks including risk of bowel and bladder dysfunction and lymphedema. We also reviewed possible need for further treatment such as radiation or chemotherapy based on final pathology. I counseled her on need for foley catheter x 7-10 days and possible risk of prolonged catheterization if unable to void, as well as possible permanent bladder dysfunction.  She was given the opportunity to ask questions, which were answered to her satisfaction, and she is agreement with the above mentioned plan of care.   We reviewed the plan for a radical hysterectomy, bilateral salpingo-oophorectomy, pelvic lymphadenectomy, any other indicated procedures. The risks of surgery were discussed in detail  as outline above and she understands these to include infection; wound separation; hernia; vaginal cuff separation, injury to adjacent organs such as bowel, bladder, blood vessels, ureters and nerves; bleeding which may require blood transfusion; anesthesia risk; thromboembolic events; possible death; unforeseen complications; possible need for re-exploration; medical complications such as heart attack, stroke, pleural effusion and pneumonia; and, if full lymphadenectomy is performed the risk of lymphedema and lymphocyst. The patient will receive DVT and antibiotic prophylaxis as indicated. She voiced a clear understanding. She had the opportunity to ask questions. Perioperative instructions were reviewed with her. Prescriptions for post-op medications were sent to her pharmacy of choice.  32 minutes of total time was spent for this patient encounter, including preparation,  face-to-face counseling with the patient and coordination of care, and documentation of the encounter.  Jeral Pinch, MD  Division of Gynecologic Oncology  Department of Obstetrics and Gynecology  [facility]

## 2022-07-30 NOTE — Progress Notes (Signed)
Patient here for follow up with Dr. Berline Lopes and for a pre-operative appointment prior to her scheduled surgery on August 10, 2022. She is scheduled for open radical hysterectomy, bilateral salpingo-oophorectomy, pelvic lymph node dissection. She has her pre-admission testing appointment on 10/31 at [facility].  The surgery was discussed in detail.  See after visit summary for additional details. Visual aids used to discuss items related to surgery including the incentive spirometer, sequential compression stockings, foley catheter, IV pump, multi-modal pain regimen including tylenol, female reproductive system to discuss surgery in detail.      Discussed post-op pain management in detail including the aspects of the enhanced recovery pathway.  Advised her that a new prescription would be sent in for oxycodone and it is only to be used for after her upcoming surgery.  We discussed the use of tylenol post-op and to monitor for a maximum of 4,000 mg in a 24 hour period.  Also prescribed sennakot to be used after surgery and to hold if having loose stools.  Discussed bowel regimen in detail.     Discussed the use of SCDs and measures to take at home to prevent DVT including frequent mobility.  Reportable signs and symptoms of DVT discussed. Post-operative instructions discussed and expectations for after surgery. Incisional care discussed as well including reportable signs and symptoms including erythema, drainage, wound separation.     10 minutes spent with the patient.  Verbalizing understanding of material discussed. No needs or concerns voiced at the end of the visit.   Advised patient to call for any needs.  Advised that her post-operative medications had been prescribed and could be picked up at any time.   Foley catheter care reviewed and pt advised this would be reviewed again in the hospital.   This appointment is included in the global surgical bundle as pre-operative teaching and has no charge.

## 2022-07-30 NOTE — Patient Instructions (Signed)
Preparing for your Surgery   Plan for surgery on August 10, 2022 with Dr. Jeral Pinch at [facility] will be scheduled for open radical hysterectomy, bilateral salpingo-oophorectomy (removal of both ovaries and fallopian tubes), pelvic lymph node dissection.    Pre-operative Testing -You will receive a phone call from presurgical testing at [facility] to arrange for a pre-operative appointment and lab work.   -Bring your insurance card, copy of an advanced directive if applicable, medication list   -At that visit, you will be asked to sign a consent for a possible blood transfusion in case a transfusion becomes necessary during surgery.  The need for a blood transfusion is rare but having consent is a necessary part of your care.      -You should not be taking blood thinners or aspirin at least ten days prior to surgery unless instructed by your surgeon.   -Do not take supplements such as fish oil (omega 3), red yeast rice, turmeric before your surgery. You want to avoid medications with aspirin in them including headache powders such as BC or Goody's), Excedrin migraine.   Day Before Surgery at [place] will be asked to take in a light diet the day before surgery. You will be advised you can have clear liquids up until 3 hours before your surgery.     Eat a light diet the day before surgery.  Examples including soups, broths, toast, yogurt, mashed potatoes.  AVOID GAS PRODUCING FOODS. Things to avoid include carbonated beverages (fizzy beverages, sodas), raw fruits and raw vegetables (uncooked), or beans.    If your bowels are filled with gas, your surgeon will have difficulty visualizing your pelvic organs which increases your surgical risks.   Your role in recovery Your role is to become active as soon as directed by your doctor, while still giving yourself time to heal.  Rest when you feel tired. You will be asked to do the following in order to speed your  recovery:   - Cough and breathe deeply. This helps to clear and expand your lungs and can prevent pneumonia after surgery.  - [place]. Do mild physical activity. Walking or moving your legs help your circulation and body functions return to normal. Do not try to get up or walk alone the first time after surgery.   -If you develop swelling on one leg or the other, pain in the back of your leg, redness/warmth in one of your legs, please call the office or go to the Emergency Room to have a doppler to rule out a blood clot. For shortness of breath, chest pain-seek care in the Emergency Room as soon as possible. - Actively manage your pain. Managing your pain lets you move in comfort. We will ask you to rate your pain on a scale of zero to 10. It is your responsibility to tell your doctor or nurse where and how much you hurt so your pain can be treated.   Special Considerations -If you are diabetic, you may be placed on insulin after surgery to have closer control over your blood sugars to promote healing and recovery.  This does not mean that you will be discharged on insulin.  If applicable, your oral antidiabetics will be resumed when you are tolerating a solid diet.   -Your final pathology results from surgery should be available around one week after surgery and the results will be relayed to you when available.   -  Dr. [place] Crocker is the surgeon that assists your GYN Oncologist with surgery.  If you end up staying the night, the next day after your surgery you will either see Dr. Berline Lopes, Dr. Ernestina Patches, or Dr. [place] Crocker.   -FMLA forms can be faxed to [phone] and please allow 5-7 business days for completion.   Pain Management After Surgery -You have been prescribed your pain medication and bowel regimen medications before surgery so that you can have these available when you are discharged from the hospital. The pain medication is for use ONLY AFTER surgery  and a new prescription will not be given.    -Make sure that you have Tylenol and Ibuprofen IF YOU ARE ABLE TO TAKE THESE MEDICATIONS at home to use on a regular basis after surgery for pain control. We recommend alternating the medications every hour to six hours since they work differently and are processed in the body differently for pain relief.   -Review the attached handout on narcotic use and their risks and side effects.    Bowel Regimen -You have been prescribed Sennakot-S to take nightly to prevent constipation especially if you are taking the narcotic pain medication intermittently.  It is important to prevent constipation and drink adequate amounts of liquids. You can stop taking this medication when you are not taking pain medication and you are back on your normal bowel routine.   Risks of Surgery Risks of surgery are low but include bleeding, infection, damage to surrounding structures, re-operation, blood clots, and very rarely death.     Blood Transfusion Information (For the consent to be signed before surgery)   We will be checking your blood type before surgery so in case of emergencies, we will know what type of blood you would need.                                             WHAT IS A BLOOD TRANSFUSION?   A transfusion is the replacement of blood or some of its parts. Blood is made up of multiple cells which provide different functions. Red blood cells carry oxygen and are used for blood loss replacement. White blood cells fight against infection. Platelets control bleeding. Plasma helps clot blood. Other blood products are available for specialized needs, such as hemophilia or other clotting disorders. BEFORE THE TRANSFUSION  Who gives blood for transfusions?  You may be able to donate blood to be used at a later date on yourself (autologous donation). Relatives can be asked to donate blood. This is generally not any safer than if you have received blood from a  stranger. The same precautions are taken to ensure safety when a relative's blood is donated. Healthy volunteers who are fully evaluated to make sure their blood is safe. This is blood bank blood. Transfusion therapy is the safest it has ever been in the practice of medicine. Before blood is taken from a donor, a complete history is taken to make sure that person has no history of diseases nor engages in risky social behavior (examples are intravenous drug use or sexual activity with multiple partners). The donor's travel history is screened to minimize risk of transmitting infections, such as malaria. The donated blood is tested for signs of infectious diseases, such as HIV and hepatitis. The blood is then tested to be sure it is compatible with you in order  to minimize the chance of a transfusion reaction. If you or a relative donates blood, this is often done in anticipation of surgery and is not appropriate for emergency situations. It takes many days to process the donated blood. RISKS AND COMPLICATIONS Although transfusion therapy is very safe and saves many lives, the main dangers of transfusion include:  Getting an infectious disease. Developing a transfusion reaction. This is an allergic reaction to something in the blood you were given. Every precaution is taken to prevent this. The decision to have a blood transfusion has been considered carefully by your caregiver before blood is given. Blood is not given unless the benefits outweigh the risks.   AFTER SURGERY INSTRUCTIONS   Return to work: 4-6 weeks if applicable   Plan to have a foley catheter in place after surgery for one week. We will see you back in the office after that for bladder training and probable removal.   You will have a white honeycomb dressing over your larger incision. This dressing can be removed 5 days after surgery and you do not need to reapply a new dressing. Once you remove the dressing, you will notice that you  have the surgical glue (dermabond) on the incision and this will peel off on its own. You can get this dressing wet in the shower the days after surgery prior to removal on the 5th day.    Activity: 1. Be up and out of the bed during the day.  Take a nap if needed.  You may walk up steps but be careful and use the hand rail.  Stair climbing will tire you more than you think, you may need to stop part way and rest.    2. No lifting or straining for 6 weeks over 10 pounds. No pushing, pulling, straining for 6 weeks.   3. No driving for around 2 week(s).  Do not drive if you are taking narcotic pain medicine and make sure that your reaction time has returned.    4. You can shower as soon as the next day after surgery. Shower daily.  Use your regular soap and water (not directly on the incision) and pat your incision(s) dry afterwards; don't rub.  No tub baths or submerging your body in water until cleared by your surgeon. If you have the soap that was given to you by pre-surgical testing that was used before surgery, you do not need to use it afterwards because this can irritate your incisions.    5. No sexual activity and nothing in the vagina for 8-10 weeks.   6. You may experience a small amount of clear drainage from your incision, which is normal.  If the drainage persists, increases, or changes color please call the office.   7. Do not use creams, lotions, or ointments such as neosporin on your incision after surgery until advised by your surgeon because they can cause removal of the dermabond glue on your incisions.     8. You may experience vaginal spotting after surgery or around the 6-8 week mark from surgery when the stitches at the top of the vagina begin to dissolve.  The spotting is normal but if you experience heavy bleeding, call our office.   9. Take Tylenol or ibuprofen first for pain if you are able to take these medications and only use narcotic pain medication for severe pain not  relieved by the Tylenol or Ibuprofen.  Monitor your Tylenol intake to a max of 4,000 mg  in a 24 hour period. You can alternate these medications after surgery.   Diet: 1. Low sodium Heart Healthy Diet is recommended but you are cleared to resume your normal (before surgery) diet after your procedure.   2. It is safe to use a laxative, such as Miralax or Colace, if you have difficulty moving your bowels. You have been prescribed Sennakot-S to take at bedtime every evening after surgery to keep bowel movements regular and to prevent constipation.     Wound Care: 1. Keep clean and dry.  Shower daily.   Reasons to call the Doctor: Fever - Oral temperature greater than 100.4 degrees Fahrenheit Foul-smelling vaginal discharge Difficulty urinating Nausea and vomiting Increased pain at the site of the incision that is unrelieved with pain medicine. Difficulty breathing with or without chest pain New calf pain especially if only on one side Sudden, continuing increased vaginal bleeding with or without clots.   Contacts: For questions or concerns you should contact:   Dr. Jeral Pinch at [phone]   Joylene John, NP at [phone]   After Hours: call [phone] and have the GYN Oncologist paged/contacted (after 5 pm or on the weekends).   Messages sent via mychart are for non-urgent matters and are not responded to after hours so for urgent needs, please call the after hours number.

## 2022-07-31 NOTE — Progress Notes (Signed)
The patient was identified using 2 approved identifiers. All issues noted in this document were discussed and addressed, Katherine   [name] voiced understanding and agreement with all preoperative instructions. She will come to our [facility] Lab for bloodwork and EKG on Friday 08-04-2022. The patient was emailed the surgery instructions per her request; email used was [email or web address]     COVID Vaccine received:  '[x]'$  No '[]'$  Yes Date of any COVID positive Test in last 90 days:  none  PCP - Ihor Dow, MD Cardiologist - none  Chest x-ray - n/a EKG -  do at [facility] appt for labs on Friday 08-04-2022 Stress Test - n/a ECHO - n/a Cardiac Cath - n/a  PCR screen: '[]'$  Ordered & Completed                      '[x]'$   Not Ordered but Needs PROFEND                      '[]'$   N/A for this surgery  Surgery Plan:  '[]'$  Ambulatory  '[]'$  Outpatient in bed   '[x]'$  Admit Anesthesia:    '[x]'$  General  '[]'$  Spinal  '[]'$   Choice  Bowel Prep - '[x]'$  No  '[]'$   Yes ____GYN  non gassy food diet only___  Pacemaker/ICD device     '[x]'$  N/A Spinal Cord Stimulator:'[x]'$  No '[]'$  Yes      History of Sleep Apnea? '[x]'$  No '[]'$  Yes   CPAP used?- '[x]'$  No '[]'$  Yes    Does the patient monitor blood sugar? '[]'$  No '[]'$  Yes  '[x]'$  N/A  Blood Thinner Instructions: none  Aspirin Instructions: none Last Dose:  ERAS Protocol Ordered: '[]'$  No  '[x]'$  Yes PRE-SURGERY '[]'$  ENSURE  '[]'$  G2  '[x]'$  No Drink Ordered  Comments: Has full upper dentures,  Will be getting VANCOMYCIN.   Patient will sign her Blood consent and Surgery Consent the day of surgery. She will come to [facility] for Labwork / EKG on Friday 08-04-22 at 11:45 am.She is aware of this and agrees to this plan  Activity level: Patient can climb a flight of stairs without difficulty; '[x]'$  No CP  '[x]'$  No SOB. Patient can / can not perform ADLs without assistance.   Anesthesia review: HTN, Asthma,   Patient denies shortness of breath, fever, cough and chest pain at PAT appointment.  Patient verbalized understanding and  agreement to the Pre-Surgical Instructions that were given to them at this PAT appointment. Patient was also educated of the need to review these PAT instructions again prior to his/her surgery.I reviewed the appropriate phone numbers to call if they have any and questions or concerns.

## 2022-08-02 ENCOUNTER — Encounter (HOSPITAL_COMMUNITY): Payer: Self-pay

## 2022-08-02 ENCOUNTER — Other Ambulatory Visit: Payer: Self-pay

## 2022-08-02 ENCOUNTER — Encounter (HOSPITAL_COMMUNITY)
Admission: RE | Admit: 2022-08-02 | Discharge: 2022-08-02 | Disposition: A | Discharge status: Home / Self Care | Payer: Medicare HMO | Source: Ambulatory Visit | Attending: Gynecologic Oncology | Admitting: Gynecologic Oncology

## 2022-08-02 DIAGNOSIS — I10 Essential (primary) hypertension: Secondary | ICD-10-CM

## 2022-08-02 HISTORY — DX: Personal history of other diseases of the digestive system: Z87.19

## 2022-08-02 HISTORY — DX: Pneumonia, unspecified organism: J18.9

## 2022-08-02 NOTE — Patient Instructions (Addendum)
SURGICAL WAITING ROOM VISITATION Patients having surgery or a procedure may have no more than 2 support people in the waiting area - these visitors may rotate in the visitor waiting room.   Children under the age of 82 must have an adult with them who is not the patient. If the patient needs to stay at the hospital during part of their recovery, the visitor guidelines for inpatient rooms apply.  PRE-OP VISITATION  Pre-op nurse will coordinate an appropriate time for 1 support person to accompany the patient in pre-op.  This support person may not rotate.  This visitor will be contacted when the time is appropriate for the visitor to come back in the pre-op area.  Please refer to the [facility] website for the visitor guidelines for Inpatients (after your surgery is over and you are in a regular room).  You are not required to quarantine at this time prior to your surgery. However, you must do this: Hand Hygiene often Do NOT share personal items Notify your provider if you are in close contact with someone who has COVID or you develop fever 100.4 or greater, new onset of sneezing, cough, sore throat, shortness of breath or body aches.  If you test positive for Covid or have been in contact with anyone that has tested positive in the last 10 days please notify you surgeon.    Your procedure is scheduled on:  Wednesday  August 10, 2022  Report to [facility] Main Entrance: [address] entrance where the [employer] is available.   Report to admitting at:  06:15   AM  +++++Call this number if you have any questions or problems the morning of surgery [phone]  Eat a light diet the day before surgery.  Examples including soups, broths, toast, yogurt, mashed potatoes.  AVOID GAS PRODUCING FOODS. Things to avoid include carbonated beverages (fizzy beverages, sodas), raw fruits and raw vegetables (uncooked), or beans.     Do not eat food after Midnight the night prior to your  surgery/procedure.  After Midnight you may have the following liquids until  05:30 AM DAY OF SURGERY  Clear Liquid Diet Water Black Coffee (sugar ok, NO MILK/CREAM OR CREAMERS)  Tea (sugar ok, NO MILK/CREAM OR CREAMERS) regular and decaf                             Plain Jell-O  with no fruit (NO RED)                                           Fruit ices (not with fruit pulp, NO RED)                                     Popsicles (NO RED)                                                                  Juice: apple, WHITE grape, WHITE cranberry Sports drinks like Gatorade or Powerade (NO RED)  FOLLOW  ANY ADDITIONAL PRE OP INSTRUCTIONS YOU RECEIVED FROM YOUR SURGEON'S OFFICE!!!   Oral Hygiene is also important to reduce your risk of infection.        Remember - BRUSH YOUR TEETH THE MORNING OF SURGERY WITH YOUR REGULAR TOOTHPASTE  Do NOT smoke after Midnight the night before surgery.  Take ONLY these medicines the morning of surgery with A SIP OF WATER: Sertraline (Zoloft), Omeprazole (Prilosec). You may use your Albuterol Inhaler if needed.   You may not have any metal on your body including hair pins, jewelry, and body piercing  Do not wear make-up, lotions, powders, perfumes  or deodorant  Do not wear nail polish including gel and S&S, artificial / acrylic nails, or any other type of covering on natural nails including finger and toenails. If you have artificial nails, gel coating, etc., that needs to be removed by a nail salon, Please have this removed prior to surgery. Not doing so may mean that your surgery could be cancelled or delayed if the Surgeon or anesthesia staff feels like they are unable to monitor you safely.   Do not shave 48 hours prior to surgery to avoid nicks in your skin which may contribute to postoperative infections.   Contacts, Hearing Aids, dentures or bridgework may not be worn into surgery.   You may bring a small overnight bag with you on  the day of surgery, only pack items that are not valuable .[facility] IS NOT RESPONSIBLE   FOR VALUABLES THAT ARE LOST OR STOLEN.   Do not bring your home medications to the hospital (Except your ALBUTEROL INHALER). The Pharmacy will dispense medications listed on your medication list to you during your admission in the Hospital.  Special Instructions: Bring a copy of your healthcare power of attorney and living will documents the day of surgery, if you wish to have them scanned into your [facility] Records- EPIC  Please read over the following fact sheets you were given: IF YOU HAVE QUESTIONS ABOUT YOUR PRE-OP INSTRUCTIONS, PLEASE CALL [phone]  ([place])   [facility] - Preparing for Surgery Before surgery, you can play an important role.  Because skin is not sterile, your skin needs to be as free of germs as possible.  You can reduce the number of germs on your skin by washing with CHG (chlorahexidine gluconate) soap before surgery.  CHG is an antiseptic cleaner which kills germs and bonds with the skin to continue killing germs even after washing. Please DO NOT use if you have an allergy to CHG or antibacterial soaps.  If your skin becomes reddened/irritated stop using the CHG and inform your nurse when you arrive at Short Stay. Do not shave (including legs and underarms) for at least 48 hours prior to the first CHG shower.  You may shave your face/neck.  Please follow these instructions carefully:  1.  Shower with CHG Soap the night before surgery and the  morning of surgery.  2.  If you choose to wash your hair, wash your hair first as usual with your normal  shampoo.  3.  After you shampoo, rinse your hair and body thoroughly to remove the shampoo.                             4.  Use CHG as you would any other liquid soap.  You can apply chg directly to the skin and wash.  Gently with a  scrungie or clean washcloth.  5.  Apply the CHG Soap to your body ONLY FROM THE NECK DOWN.   Do  not use on face/ open                           Wound or open sores. Avoid contact with eyes, ears mouth and genitals (private parts).                       Wash face,  Genitals (private parts) with your normal soap.             6.  Wash thoroughly, paying special attention to the area where your  surgery  will be performed.  7.  Thoroughly rinse your body with warm water from the neck down.  8.  DO NOT shower/wash with your normal soap after using and rinsing off the CHG Soap.            9.  Pat yourself dry with a clean towel.            10.  Wear clean pajamas.            11.  Place clean sheets on your bed the night of your first shower and do not  sleep with pets.  ON THE DAY OF SURGERY : Do not apply any lotions/deodorants the morning of surgery.  Please wear clean clothes to the hospital/surgery center.    FAILURE TO FOLLOW THESE INSTRUCTIONS MAY RESULT IN THE CANCELLATION OF YOUR SURGERY  PATIENT SIGNATURE_________________________________  NURSE SIGNATURE__________________________________  ________________________________________________________________________      WHAT IS A BLOOD TRANSFUSION? Blood Transfusion Information  A transfusion is the replacement of blood or some of its parts. Blood is made up of multiple cells which provide different functions. Red blood cells carry oxygen and are used for blood loss replacement. White blood cells fight against infection. Platelets control bleeding. Plasma helps clot blood. Other blood products are available for specialized needs, such as hemophilia or other clotting disorders. BEFORE THE TRANSFUSION  Who gives blood for transfusions?  Healthy volunteers who are fully evaluated to make sure their blood is safe. This is blood bank blood. Transfusion therapy is the safest it has ever been in the practice of medicine. Before blood is taken from a donor, a complete history is taken to make sure that person has no history of  diseases nor engages in risky social behavior (examples are intravenous drug use or sexual activity with multiple partners). The donor's travel history is screened to minimize risk of transmitting infections, such as malaria. The donated blood is tested for signs of infectious diseases, such as HIV and hepatitis. The blood is then tested to be sure it is compatible with you in order to minimize the chance of a transfusion reaction. If you or a relative donates blood, this is often done in anticipation of surgery and is not appropriate for emergency situations. It takes many days to process the donated blood. RISKS AND COMPLICATIONS Although transfusion therapy is very safe and saves many lives, the main dangers of transfusion include:  Getting an infectious disease. Developing a transfusion reaction. This is an allergic reaction to something in the blood you were given. Every precaution is taken to prevent this. The decision to have a blood transfusion has been considered carefully by your caregiver before blood is given. Blood is not given unless the benefits outweigh the risks. AFTER THE  TRANSFUSION Right after receiving a blood transfusion, you will usually feel much better and more energetic. This is especially true if your red blood cells have gotten low (anemic). The transfusion raises the level of the red blood cells which carry oxygen, and this usually causes an energy increase. The nurse administering the transfusion will monitor you carefully for complications. HOME CARE INSTRUCTIONS  No special instructions are needed after a transfusion. You may find your energy is better. Speak with your caregiver about any limitations on activity for underlying diseases you may have. SEEK MEDICAL CARE IF:  Your condition is not improving after your transfusion. You develop redness or irritation at the intravenous (IV) site. SEEK IMMEDIATE MEDICAL CARE IF:  Any of the following symptoms occur over the  next 12 hours: Shaking chills. You have a temperature by mouth above 102 F (38.9 C), not controlled by medicine. Chest, back, or muscle pain. People around you feel you are not acting correctly or are confused. Shortness of breath or difficulty breathing. Dizziness and fainting. You get a rash or develop hives. You have a decrease in urine output. Your urine turns a dark color or changes to pink, red, or brown. Any of the following symptoms occur over the next 10 days: You have a temperature by mouth above 102 F (38.9 C), not controlled by medicine. Shortness of breath. Weakness after normal activity. The white part of the eye turns yellow (jaundice). You have a decrease in the amount of urine or are urinating less often. Your urine turns a dark color or changes to pink, red, or brown. Document Released: 09/16/2000 Document Revised: 12/12/2011 Document Reviewed: 05/05/2008 [facility] Patient Information 2014 [place], [place].  _______________________________________________________________________

## 2022-08-05 ENCOUNTER — Encounter (HOSPITAL_COMMUNITY)
Admission: RE | Admit: 2022-08-05 | Discharge: 2022-08-05 | Disposition: A | Discharge status: Home / Self Care | Payer: Medicare HMO | Source: Ambulatory Visit | Attending: Gynecologic Oncology | Admitting: Gynecologic Oncology

## 2022-08-05 DIAGNOSIS — I10 Essential (primary) hypertension: Secondary | ICD-10-CM | POA: Insufficient documentation

## 2022-08-05 DIAGNOSIS — C539 Malignant neoplasm of cervix uteri, unspecified: Secondary | ICD-10-CM

## 2022-08-05 DIAGNOSIS — Z01818 Encounter for other preprocedural examination: Secondary | ICD-10-CM | POA: Diagnosis not present

## 2022-08-05 LAB — CBC
HCT: 39.8 % (ref 36.0–46.0)
Hemoglobin: 13.2 g/dL (ref 12.0–15.0)
MCH: 29.8 pg (ref 26.0–34.0)
MCHC: 33.2 g/dL (ref 30.0–36.0)
MCV: 89.8 fL (ref 80.0–100.0)
Platelets: 269 10*3/uL (ref 150–400)
RBC: 4.43 MIL/uL (ref 3.87–5.11)
RDW: 12.8 % (ref 11.5–15.5)
WBC: 6.3 10*3/uL (ref 4.0–10.5)
nRBC: 0 % (ref 0.0–0.2)

## 2022-08-05 LAB — COMPREHENSIVE METABOLIC PANEL
ALT: 20 U/L (ref 0–44)
AST: 28 U/L (ref 15–41)
Albumin: 4.4 g/dL (ref 3.5–5.0)
Alkaline Phosphatase: 122 U/L (ref 38–126)
Anion gap: 8 (ref 5–15)
BUN: 8 mg/dL (ref 6–20)
CO2: 27 mmol/L (ref 22–32)
Calcium: 9.6 mg/dL (ref 8.9–10.3)
Chloride: 107 mmol/L (ref 98–111)
Creatinine, Ser: 0.55 mg/dL (ref 0.44–1.00)
GFR, Estimated: >60 mL/min (ref >60)
Glucose, Bld: 97 mg/dL (ref 70–99)
Potassium: 4.4 mmol/L (ref 3.5–5.1)
Sodium: 142 mmol/L (ref 135–145)
Total Bilirubin: 0.6 mg/dL (ref 0.3–1.2)
Total Protein: 7.8 g/dL (ref 6.5–8.1)

## 2022-08-09 ENCOUNTER — Telehealth: Payer: Self-pay | Admitting: Surgery

## 2022-08-09 NOTE — Telephone Encounter (Signed)
Telephone call to check on pre-operative status.  Patient compliant with pre-operative instructions.  Reinforced nothing to eat after midnight. Clear liquids until 5:15am. Patient to arrive at 6:15am.  No questions or concerns voiced.  Instructed to call for any needs.

## 2022-08-09 NOTE — Discharge Instructions (Addendum)
AFTER SURGERY INSTRUCTIONS   Return to work: 4-6 weeks if applicable   Plan to have a foley catheter in place after surgery for one week. We will see you back in the office after that for bladder training and probable removal.   You will have a white honeycomb dressing over your larger incision. This dressing can be removed 5 days after surgery and you do not need to reapply a new dressing. Once you remove the dressing, you will notice that you have the surgical glue (dermabond) on the incision and this will peel off on its own. You can get this dressing wet in the shower the days after surgery prior to removal on the 5th day.   We will see you back in the office in one week for catheter removal.  We have prescribed iron that you can start off taking every other day and when having normal bowel movements, you can change to once a day.   Activity: 1. Be up and out of the bed during the day.  Take a nap if needed.  You may walk up steps but be careful and use the hand rail.  Stair climbing will tire you more than you think, you may need to stop part way and rest.    2. No lifting or straining for 6 weeks over 10 pounds. No pushing, pulling, straining for 6 weeks.   3. No driving for around 2 week(s).  Do not drive if you are taking narcotic pain medicine and make sure that your reaction time has returned.    4. You can shower as soon as the next day after surgery. Shower daily.  Use your regular soap and water (not directly on the incision) and pat your incision(s) dry afterwards; don't rub.  No tub baths or submerging your body in water until cleared by your surgeon. If you have the soap that was given to you by pre-surgical testing that was used before surgery, you do not need to use it afterwards because this can irritate your incisions.    5. No sexual activity and nothing in the vagina for 8-10 weeks.   6. You may experience a small amount of clear drainage from your incision, which is  normal.  If the drainage persists, increases, or changes color please call the office.   7. Do not use creams, lotions, or ointments such as neosporin on your incision after surgery until advised by your surgeon because they can cause removal of the dermabond glue on your incisions.     8. You may experience vaginal spotting after surgery or around the 6-8 week mark from surgery when the stitches at the top of the vagina begin to dissolve.  The spotting is normal but if you experience heavy bleeding, call our office.   9. Take Tylenol or ibuprofen first for pain if you are able to take these medications and only use narcotic pain medication for severe pain not relieved by the Tylenol or Ibuprofen.  Monitor your Tylenol intake to a max of 4,000 mg in a 24 hour period. You can alternate these medications after surgery.   Diet: 1. Low sodium Heart Healthy Diet is recommended but you are cleared to resume your normal (before surgery) diet after your procedure.   2. It is safe to use a laxative, such as Miralax or Colace, if you have difficulty moving your bowels. You have been prescribed Sennakot-S to take at bedtime every evening after surgery to keep bowel movements  regular and to prevent constipation.     Wound Care: 1. Keep clean and dry.  Shower daily.   Reasons to call the Doctor: Fever - Oral temperature greater than 100.4 degrees Fahrenheit Foul-smelling vaginal discharge Difficulty urinating Nausea and vomiting Increased pain at the site of the incision that is unrelieved with pain medicine. Difficulty breathing with or without chest pain New calf pain especially if only on one side Sudden, continuing increased vaginal bleeding with or without clots.   Contacts: For questions or concerns you should contact:   Dr. Jeral Pinch at [phone]   Joylene John, NP at [phone]   After Hours: call [phone] and have the GYN Oncologist paged/contacted (after 5 pm or on the  weekends).   Messages sent via mychart are for non-urgent matters and are not responded to after hours so for urgent needs, please call the after hours number.

## 2022-08-09 NOTE — Anesthesia Preprocedure Evaluation (Signed)
Anesthesia Evaluation  Patient identified by MRN, date of birth, ID band Patient awake    Reviewed: Allergy & Precautions, NPO status , Patient's Chart, lab work & pertinent test results  History of Anesthesia Complications Negative for: history of anesthetic complications  Airway Mallampati: II  TM Distance: >3 FB Neck ROM: Full    Dental  (+) Edentulous Upper, Edentulous Lower   Pulmonary neg shortness of breath, asthma , neg sleep apnea, neg recent URI   Pulmonary exam normal breath sounds clear to auscultation       Cardiovascular hypertension, (-) angina (-) CAD, (-) Past MI, (-) Cardiac Stents and (-) CABG  Rhythm:Regular Rate:Normal     Neuro/Psych  PSYCHIATRIC DISORDERS Anxiety Depression    negative neurological ROS     GI/Hepatic Neg liver ROS, hiatal hernia,GERD  Medicated,,  Endo/Other  negative endocrine ROS    Renal/GU CRFRenal disease     Musculoskeletal   Abdominal  (+) - obese  Peds  Hematology negative hematology ROS (+)   Anesthesia Other Findings Cervical cancer  Reproductive/Obstetrics                             Anesthesia Physical Anesthesia Plan  ASA: 3  Anesthesia Plan: General   Post-op Pain Management:    Induction: Intravenous  PONV Risk Score and Plan: 3 and Ondansetron, Dexamethasone and Scopolamine patch - Pre-op  Airway Management Planned: Oral ETT  Additional Equipment:   Intra-op Plan:   Post-operative Plan:   Informed Consent: I have reviewed the patients History and Physical, chart, labs and discussed the procedure including the risks, benefits and alternatives for the proposed anesthesia with the patient or authorized representative who has indicated his/her understanding and acceptance.     Dental advisory given  Plan Discussed with: CRNA and Anesthesiologist  Anesthesia Plan Comments:        Anesthesia Quick Evaluation

## 2022-08-10 ENCOUNTER — Other Ambulatory Visit: Payer: Self-pay

## 2022-08-10 ENCOUNTER — Encounter (HOSPITAL_COMMUNITY)
Admission: RE | Disposition: A | Discharge status: Home / Self Care | Payer: Self-pay | Source: Home / Self Care | Attending: Gynecologic Oncology

## 2022-08-10 ENCOUNTER — Inpatient Hospital Stay (HOSPITAL_COMMUNITY)
Admission: RE | Admit: 2022-08-10 | Discharge: 2022-08-12 | DRG: 740 | Disposition: A | Discharge status: Home / Self Care | Payer: Medicare HMO | Attending: Gynecologic Oncology | Admitting: Gynecologic Oncology

## 2022-08-10 ENCOUNTER — Encounter (HOSPITAL_COMMUNITY): Payer: Self-pay | Admitting: Gynecologic Oncology

## 2022-08-10 ENCOUNTER — Inpatient Hospital Stay (HOSPITAL_COMMUNITY): Payer: Medicare HMO | Admitting: Anesthesiology

## 2022-08-10 DIAGNOSIS — C539 Malignant neoplasm of cervix uteri, unspecified: Principal | ICD-10-CM | POA: Diagnosis present

## 2022-08-10 DIAGNOSIS — N189 Chronic kidney disease, unspecified: Secondary | ICD-10-CM

## 2022-08-10 DIAGNOSIS — J45909 Unspecified asthma, uncomplicated: Secondary | ICD-10-CM | POA: Diagnosis not present

## 2022-08-10 DIAGNOSIS — Z800 Family history of malignant neoplasm of digestive organs: Secondary | ICD-10-CM | POA: Diagnosis not present

## 2022-08-10 DIAGNOSIS — I129 Hypertensive chronic kidney disease with stage 1 through stage 4 chronic kidney disease, or unspecified chronic kidney disease: Secondary | ICD-10-CM

## 2022-08-10 DIAGNOSIS — F418 Other specified anxiety disorders: Secondary | ICD-10-CM | POA: Diagnosis not present

## 2022-08-10 DIAGNOSIS — Z880 Allergy status to penicillin: Secondary | ICD-10-CM

## 2022-08-10 DIAGNOSIS — K219 Gastro-esophageal reflux disease without esophagitis: Secondary | ICD-10-CM | POA: Diagnosis present

## 2022-08-10 DIAGNOSIS — Z79899 Other long term (current) drug therapy: Secondary | ICD-10-CM

## 2022-08-10 DIAGNOSIS — Z825 Family history of asthma and other chronic lower respiratory diseases: Secondary | ICD-10-CM

## 2022-08-10 DIAGNOSIS — D62 Acute posthemorrhagic anemia: Secondary | ICD-10-CM | POA: Diagnosis not present

## 2022-08-10 DIAGNOSIS — Z818 Family history of other mental and behavioral disorders: Secondary | ICD-10-CM | POA: Diagnosis not present

## 2022-08-10 DIAGNOSIS — F32A Depression, unspecified: Secondary | ICD-10-CM | POA: Diagnosis present

## 2022-08-10 DIAGNOSIS — N8302 Follicular cyst of left ovary: Secondary | ICD-10-CM | POA: Diagnosis not present

## 2022-08-10 DIAGNOSIS — Z8249 Family history of ischemic heart disease and other diseases of the circulatory system: Secondary | ICD-10-CM

## 2022-08-10 DIAGNOSIS — F411 Generalized anxiety disorder: Secondary | ICD-10-CM | POA: Diagnosis not present

## 2022-08-10 DIAGNOSIS — N8301 Follicular cyst of right ovary: Secondary | ICD-10-CM | POA: Diagnosis not present

## 2022-08-10 DIAGNOSIS — Z823 Family history of stroke: Secondary | ICD-10-CM | POA: Diagnosis not present

## 2022-08-10 DIAGNOSIS — Z8261 Family history of arthritis: Secondary | ICD-10-CM | POA: Diagnosis not present

## 2022-08-10 DIAGNOSIS — N840 Polyp of corpus uteri: Secondary | ICD-10-CM | POA: Diagnosis not present

## 2022-08-10 DIAGNOSIS — D069 Carcinoma in situ of cervix, unspecified: Secondary | ICD-10-CM | POA: Diagnosis not present

## 2022-08-10 DIAGNOSIS — B977 Papillomavirus as the cause of diseases classified elsewhere: Secondary | ICD-10-CM | POA: Diagnosis not present

## 2022-08-10 HISTORY — PX: RADICAL HYSTERECTOMY: SHX2283

## 2022-08-10 HISTORY — PX: LYMPH NODE DISSECTION: SHX5087

## 2022-08-10 LAB — CBC
HCT: 25.2 % — ABNORMAL LOW (ref 36.0–46.0)
Hemoglobin: 8.5 g/dL — ABNORMAL LOW (ref 12.0–15.0)
MCH: 30.4 pg (ref 26.0–34.0)
MCHC: 33.7 g/dL (ref 30.0–36.0)
MCV: 90 fL (ref 80.0–100.0)
Platelets: 191 10*3/uL (ref 150–400)
RBC: 2.8 MIL/uL — ABNORMAL LOW (ref 3.87–5.11)
RDW: 13 % (ref 11.5–15.5)
WBC: 9.4 10*3/uL (ref 4.0–10.5)
nRBC: 0 % (ref 0.0–0.2)

## 2022-08-10 LAB — POCT I-STAT EG7
Acid-base deficit: 3 mmol/L — ABNORMAL HIGH (ref 0.0–2.0)
Bicarbonate: 22.7 mmol/L (ref 20.0–28.0)
Calcium, Ion: 1.14 mmol/L — ABNORMAL LOW (ref 1.15–1.40)
HCT: 28 % — ABNORMAL LOW (ref 36.0–46.0)
Hemoglobin: 9.5 g/dL — ABNORMAL LOW (ref 12.0–15.0)
O2 Saturation: 99 %
Potassium: 3.7 mmol/L (ref 3.5–5.1)
Sodium: 138 mmol/L (ref 135–145)
TCO2: 24 mmol/L (ref 22–32)
pCO2, Ven: 40.9 mmHg — ABNORMAL LOW (ref 44–60)
pH, Ven: 7.351 (ref 7.25–7.43)
pO2, Ven: 172 mmHg — ABNORMAL HIGH (ref 32–45)

## 2022-08-10 LAB — BASIC METABOLIC PANEL
Anion gap: 9 (ref 5–15)
BUN: 9 mg/dL (ref 6–20)
CO2: 22 mmol/L (ref 22–32)
Calcium: 8 mg/dL — ABNORMAL LOW (ref 8.9–10.3)
Chloride: 108 mmol/L (ref 98–111)
Creatinine, Ser: 0.64 mg/dL (ref 0.44–1.00)
GFR, Estimated: >60 mL/min (ref >60)
Glucose, Bld: 173 mg/dL — ABNORMAL HIGH (ref 70–99)
Potassium: 3.7 mmol/L (ref 3.5–5.1)
Sodium: 139 mmol/L (ref 135–145)

## 2022-08-10 LAB — ABO/RH: ABO/RH(D): O POS

## 2022-08-10 LAB — TYPE AND SCREEN
ABO/RH(D): O POS
Antibody Screen: NEGATIVE

## 2022-08-10 SURGERY — HYSTERECTOMY, RADICAL, ABDOMINAL
Anesthesia: General

## 2022-08-10 MED ORDER — SODIUM CHLORIDE (PF) 0.9 % IJ SOLN
INTRAMUSCULAR | Status: AC
  Filled 2022-08-10: qty 20

## 2022-08-10 MED ORDER — KCL IN DEXTROSE-NACL 20-5-0.45 MEQ/L-%-% IV SOLN
INTRAVENOUS | Status: DC
  Filled 2022-08-10 (×2): qty 1000

## 2022-08-10 MED ORDER — LIDOCAINE HCL (PF) 2 % IJ SOLN
INTRAMUSCULAR | Status: DC | PRN
  Administered 2022-08-10: 1 mg/kg/h via INTRADERMAL

## 2022-08-10 MED ORDER — SODIUM CHLORIDE (PF) 0.9 % IJ SOLN
INTRAMUSCULAR | Status: DC | PRN
  Administered 2022-08-10: 20 mL

## 2022-08-10 MED ORDER — GABAPENTIN 300 MG PO CAPS
300.0000 mg | ORAL_CAPSULE | ORAL | Status: AC
  Administered 2022-08-10: 300 mg via ORAL
  Filled 2022-08-10: qty 1

## 2022-08-10 MED ORDER — PROPOFOL 10 MG/ML IV BOLUS
INTRAVENOUS | Status: DC | PRN
  Administered 2022-08-10: 150 mg via INTRAVENOUS

## 2022-08-10 MED ORDER — HEMOSTATIC AGENTS (NO CHARGE) OPTIME
TOPICAL | Status: DC | PRN
  Administered 2022-08-10: 1 via TOPICAL

## 2022-08-10 MED ORDER — ONDANSETRON HCL 4 MG PO TABS: 4.0000 mg | ORAL_TABLET | Freq: Four times a day (QID) | ORAL | Status: DC | PRN

## 2022-08-10 MED ORDER — OXYCODONE HCL 5 MG/5ML PO SOLN: 5.0000 mg | Freq: Once | ORAL | Status: DC | PRN

## 2022-08-10 MED ORDER — BUPIVACAINE HCL 0.25 % IJ SOLN
INTRAMUSCULAR | Status: DC | PRN
  Administered 2022-08-10: 20 mL

## 2022-08-10 MED ORDER — PHENYLEPHRINE 80 MCG/ML (10ML) SYRINGE FOR IV PUSH (FOR BLOOD PRESSURE SUPPORT)
PREFILLED_SYRINGE | INTRAVENOUS | Status: AC
  Filled 2022-08-10: qty 10

## 2022-08-10 MED ORDER — ALBUMIN HUMAN 5 % IV SOLN: INTRAVENOUS | Status: DC | PRN

## 2022-08-10 MED ORDER — PROPOFOL 10 MG/ML IV BOLUS
INTRAVENOUS | Status: AC
  Filled 2022-08-10: qty 20

## 2022-08-10 MED ORDER — DEXAMETHASONE SODIUM PHOSPHATE 10 MG/ML IJ SOLN
INTRAMUSCULAR | Status: DC | PRN
  Administered 2022-08-10: 10 mg via INTRAVENOUS

## 2022-08-10 MED ORDER — ROCURONIUM BROMIDE 10 MG/ML (PF) SYRINGE
PREFILLED_SYRINGE | INTRAVENOUS | Status: AC
  Filled 2022-08-10: qty 20

## 2022-08-10 MED ORDER — ONDANSETRON HCL 4 MG/2ML IJ SOLN: 4.0000 mg | Freq: Four times a day (QID) | INTRAMUSCULAR | Status: DC | PRN

## 2022-08-10 MED ORDER — VANCOMYCIN HCL IN DEXTROSE 1-5 GM/200ML-% IV SOLN
1000.0000 mg | Freq: Once | INTRAVENOUS | Status: AC
  Administered 2022-08-10: 1000 mg via INTRAVENOUS
  Filled 2022-08-10: qty 200

## 2022-08-10 MED ORDER — DEXAMETHASONE SODIUM PHOSPHATE 10 MG/ML IJ SOLN
INTRAMUSCULAR | Status: AC
  Filled 2022-08-10: qty 1

## 2022-08-10 MED ORDER — SERTRALINE HCL 25 MG PO TABS
25.0000 mg | ORAL_TABLET | Freq: Every day | ORAL | Status: DC
  Administered 2022-08-11: 25 mg via ORAL
  Filled 2022-08-10 (×2): qty 1

## 2022-08-10 MED ORDER — PROMETHAZINE HCL 25 MG/ML IJ SOLN: 6.2500 mg | INTRAMUSCULAR | Status: DC | PRN

## 2022-08-10 MED ORDER — ORAL CARE MOUTH RINSE: 15.0000 mL | Freq: Once | OROMUCOSAL | Status: AC

## 2022-08-10 MED ORDER — PANTOPRAZOLE SODIUM 40 MG PO TBEC
40.0000 mg | DELAYED_RELEASE_TABLET | Freq: Every day | ORAL | Status: DC
  Administered 2022-08-11: 40 mg via ORAL
  Filled 2022-08-10: qty 1

## 2022-08-10 MED ORDER — ALUM & MAG HYDROXIDE-SIMETH 200-200-20 MG/5ML PO SUSP: 30.0000 mL | ORAL | Status: DC | PRN

## 2022-08-10 MED ORDER — 0.9 % SODIUM CHLORIDE (POUR BTL) OPTIME
TOPICAL | Status: DC | PRN
  Administered 2022-08-10: 3000 mL

## 2022-08-10 MED ORDER — PHENYLEPHRINE HCL-NACL 20-0.9 MG/250ML-% IV SOLN
INTRAVENOUS | Status: DC | PRN
  Administered 2022-08-10: 40 ug/min via INTRAVENOUS

## 2022-08-10 MED ORDER — MIDAZOLAM HCL 2 MG/2ML IJ SOLN
INTRAMUSCULAR | Status: AC
  Filled 2022-08-10: qty 2

## 2022-08-10 MED ORDER — SIMETHICONE 80 MG PO CHEW
80.0000 mg | CHEWABLE_TABLET | Freq: Four times a day (QID) | ORAL | Status: DC | PRN
  Administered 2022-08-11: 80 mg via ORAL
  Filled 2022-08-10: qty 1

## 2022-08-10 MED ORDER — SCOPOLAMINE 1 MG/3DAYS TD PT72
1.0000 | MEDICATED_PATCH | TRANSDERMAL | Status: DC
  Administered 2022-08-10: 1.5 mg via TRANSDERMAL
  Filled 2022-08-10: qty 1

## 2022-08-10 MED ORDER — IBUPROFEN 600 MG PO TABS
600.0000 mg | ORAL_TABLET | Freq: Four times a day (QID) | ORAL | Status: DC
  Administered 2022-08-10 – 2022-08-12 (×7): 600 mg via ORAL
  Filled 2022-08-10 (×10): qty 1

## 2022-08-10 MED ORDER — DEXAMETHASONE SODIUM PHOSPHATE 4 MG/ML IJ SOLN: 4.0000 mg | INTRAMUSCULAR | Status: DC

## 2022-08-10 MED ORDER — OXYCODONE HCL 5 MG PO TABS: 5.0000 mg | ORAL_TABLET | Freq: Once | ORAL | Status: DC | PRN

## 2022-08-10 MED ORDER — ALBUTEROL SULFATE (2.5 MG/3ML) 0.083% IN NEBU: 2.5000 mg | INHALATION_SOLUTION | Freq: Four times a day (QID) | RESPIRATORY_TRACT | Status: DC | PRN

## 2022-08-10 MED ORDER — LIDOCAINE 2% (20 MG/ML) 5 ML SYRINGE
INTRAMUSCULAR | Status: DC | PRN
  Administered 2022-08-10: 80 mg via INTRAVENOUS

## 2022-08-10 MED ORDER — PHENYLEPHRINE 80 MCG/ML (10ML) SYRINGE FOR IV PUSH (FOR BLOOD PRESSURE SUPPORT)
PREFILLED_SYRINGE | INTRAVENOUS | Status: DC | PRN
  Administered 2022-08-10 (×3): 160 ug via INTRAVENOUS
  Administered 2022-08-10 (×2): 80 ug via INTRAVENOUS

## 2022-08-10 MED ORDER — CHLORHEXIDINE GLUCONATE 0.12 % MT SOLN
15.0000 mL | Freq: Once | OROMUCOSAL | Status: AC
  Administered 2022-08-10: 15 mL via OROMUCOSAL

## 2022-08-10 MED ORDER — FENTANYL CITRATE PF 50 MCG/ML IJ SOSY
PREFILLED_SYRINGE | INTRAMUSCULAR | Status: AC
  Filled 2022-08-10: qty 1

## 2022-08-10 MED ORDER — FENTANYL CITRATE PF 50 MCG/ML IJ SOSY
25.0000 ug | PREFILLED_SYRINGE | INTRAMUSCULAR | Status: DC | PRN
  Administered 2022-08-10: 50 ug via INTRAVENOUS

## 2022-08-10 MED ORDER — ONDANSETRON HCL 4 MG/2ML IJ SOLN
INTRAMUSCULAR | Status: DC | PRN
  Administered 2022-08-10: 4 mg via INTRAVENOUS

## 2022-08-10 MED ORDER — ENOXAPARIN SODIUM 40 MG/0.4ML IJ SOSY
40.0000 mg | PREFILLED_SYRINGE | INTRAMUSCULAR | Status: DC
  Administered 2022-08-11: 40 mg via SUBCUTANEOUS
  Filled 2022-08-10: qty 0.4

## 2022-08-10 MED ORDER — EPHEDRINE 5 MG/ML INJ
INTRAVENOUS | Status: AC
  Filled 2022-08-10: qty 5

## 2022-08-10 MED ORDER — FENTANYL CITRATE (PF) 100 MCG/2ML IJ SOLN
INTRAMUSCULAR | Status: DC | PRN
  Administered 2022-08-10 (×2): 50 ug via INTRAVENOUS

## 2022-08-10 MED ORDER — KETOROLAC TROMETHAMINE 15 MG/ML IJ SOLN: 15.0000 mg | INTRAMUSCULAR | Status: DC

## 2022-08-10 MED ORDER — SENNOSIDES-DOCUSATE SODIUM 8.6-50 MG PO TABS
2.0000 | ORAL_TABLET | Freq: Every day | ORAL | Status: DC
  Administered 2022-08-10 – 2022-08-11 (×2): 2 via ORAL
  Filled 2022-08-10 (×2): qty 2

## 2022-08-10 MED ORDER — LACTATED RINGERS IV SOLN: INTRAVENOUS | Status: DC

## 2022-08-10 MED ORDER — BUPIVACAINE HCL 0.25 % IJ SOLN
INTRAMUSCULAR | Status: AC
  Filled 2022-08-10: qty 1

## 2022-08-10 MED ORDER — BUPIVACAINE LIPOSOME 1.3 % IJ SUSP
INTRAMUSCULAR | Status: AC
  Filled 2022-08-10: qty 20

## 2022-08-10 MED ORDER — EPHEDRINE SULFATE-NACL 50-0.9 MG/10ML-% IV SOSY
PREFILLED_SYRINGE | INTRAVENOUS | Status: DC | PRN
  Administered 2022-08-10: 5 mg via INTRAVENOUS

## 2022-08-10 MED ORDER — HEPARIN SODIUM (PORCINE) 5000 UNIT/ML IJ SOLN
5000.0000 [IU] | INTRAMUSCULAR | Status: AC
  Administered 2022-08-10: 5000 [IU] via SUBCUTANEOUS
  Filled 2022-08-10: qty 1

## 2022-08-10 MED ORDER — OXYCODONE HCL 5 MG PO TABS
5.0000 mg | ORAL_TABLET | ORAL | Status: DC | PRN
  Administered 2022-08-10 – 2022-08-11 (×2): 10 mg via ORAL
  Filled 2022-08-10 (×2): qty 2

## 2022-08-10 MED ORDER — ACETAMINOPHEN 500 MG PO TABS
1000.0000 mg | ORAL_TABLET | Freq: Four times a day (QID) | ORAL | Status: DC
  Administered 2022-08-10 – 2022-08-12 (×7): 1000 mg via ORAL
  Filled 2022-08-10 (×7): qty 2

## 2022-08-10 MED ORDER — FENTANYL CITRATE (PF) 100 MCG/2ML IJ SOLN
INTRAMUSCULAR | Status: AC
  Filled 2022-08-10: qty 2

## 2022-08-10 MED ORDER — ONDANSETRON HCL 4 MG/2ML IJ SOLN
INTRAMUSCULAR | Status: AC
  Filled 2022-08-10: qty 2

## 2022-08-10 MED ORDER — GLYCOPYRROLATE 0.2 MG/ML IJ SOLN
INTRAMUSCULAR | Status: DC | PRN
  Administered 2022-08-10: .2 mg via INTRAVENOUS

## 2022-08-10 MED ORDER — ROCURONIUM BROMIDE 10 MG/ML (PF) SYRINGE
PREFILLED_SYRINGE | INTRAVENOUS | Status: DC | PRN
  Administered 2022-08-10: 10 mg via INTRAVENOUS
  Administered 2022-08-10: 40 mg via INTRAVENOUS
  Administered 2022-08-10: 10 mg via INTRAVENOUS
  Administered 2022-08-10: 20 mg via INTRAVENOUS

## 2022-08-10 MED ORDER — BUPIVACAINE LIPOSOME 1.3 % IJ SUSP
INTRAMUSCULAR | Status: DC | PRN
  Administered 2022-08-10: 20 mL

## 2022-08-10 MED ORDER — ACETAMINOPHEN 500 MG PO TABS
1000.0000 mg | ORAL_TABLET | ORAL | Status: AC
  Administered 2022-08-10: 1000 mg via ORAL
  Filled 2022-08-10: qty 2

## 2022-08-10 MED ORDER — CIPROFLOXACIN IN D5W 400 MG/200ML IV SOLN
400.0000 mg | INTRAVENOUS | Status: AC
  Administered 2022-08-10: 400 mg via INTRAVENOUS
  Filled 2022-08-10: qty 200

## 2022-08-10 MED ORDER — ALBUMIN HUMAN 5 % IV SOLN
INTRAVENOUS | Status: AC
  Filled 2022-08-10: qty 250

## 2022-08-10 MED ORDER — HYDROMORPHONE HCL 1 MG/ML IJ SOLN
0.2000 mg | INTRAMUSCULAR | Status: DC | PRN
  Administered 2022-08-10: 0.2 mg via INTRAVENOUS
  Administered 2022-08-10: 0.4 mg via INTRAVENOUS
  Filled 2022-08-10 (×2): qty 1

## 2022-08-10 MED ORDER — ALBUTEROL SULFATE HFA 108 (90 BASE) MCG/ACT IN AERS: 2.0000 | INHALATION_SPRAY | Freq: Four times a day (QID) | RESPIRATORY_TRACT | Status: DC | PRN

## 2022-08-10 MED ORDER — POVIDONE-IODINE 10 % EX SWAB
2.0000 | Freq: Once | CUTANEOUS | Status: AC
  Administered 2022-08-10: 2 via TOPICAL

## 2022-08-10 MED ORDER — SUGAMMADEX SODIUM 200 MG/2ML IV SOLN
INTRAVENOUS | Status: DC | PRN
  Administered 2022-08-10: 200 mg via INTRAVENOUS

## 2022-08-10 MED ORDER — MIDAZOLAM HCL 5 MG/5ML IJ SOLN
INTRAMUSCULAR | Status: DC | PRN
  Administered 2022-08-10: 2 mg via INTRAVENOUS

## 2022-08-10 SURGICAL SUPPLY — 81 items
ADH SKN CLS APL DERMABOND .7 (GAUZE/BANDAGES/DRESSINGS)
ADH SKN CLS LQ APL DERMABOND (GAUZE/BANDAGES/DRESSINGS) ×2
AGENT HMST KT MTR STRL THRMB (HEMOSTASIS) ×1
APL PRP STRL LF DISP 70% ISPRP (MISCELLANEOUS) ×1
ATTRACTOMAT 16X20 MAGNETIC DRP (DRAPES) ×1 IMPLANT
BAG COUNTER SPONGE SURGICOUNT (BAG) IMPLANT
BAG SPNG CNTER NS LX DISP (BAG)
BLADE EXTENDED COATED 6.5IN (ELECTRODE) ×1 IMPLANT
CELLS DAT CNTRL 66122 CELL SVR (MISCELLANEOUS) IMPLANT
CHLORAPREP W/TINT 26 (MISCELLANEOUS) ×1 IMPLANT
CLIP TI LARGE 6 (CLIP) ×1 IMPLANT
CLIP TI MEDIUM 6 (CLIP) ×1 IMPLANT
CLIP TI MEDIUM LARGE 6 (CLIP) ×1 IMPLANT
CNTNR URN SCR LID CUP LEK RST (MISCELLANEOUS) IMPLANT
CONT SPEC 4OZ STRL OR WHT (MISCELLANEOUS)
COVER SURGICAL LIGHT HANDLE (MISCELLANEOUS) ×1 IMPLANT
DERMABOND ADVANCED .7 DNX12 (GAUZE/BANDAGES/DRESSINGS) IMPLANT
DERMABOND ADVANCED .7 DNX6 (GAUZE/BANDAGES/DRESSINGS) IMPLANT
DRAPE INCISE IOBAN 66X45 STRL (DRAPES) IMPLANT
DRAPE SURG IRRIG POUCH 19X23 (DRAPES) ×1 IMPLANT
DRAPE WARM FLUID 44X44 (DRAPES) ×1 IMPLANT
DRSG OPSITE POSTOP 4X10 (GAUZE/BANDAGES/DRESSINGS) IMPLANT
DRSG OPSITE POSTOP 4X6 (GAUZE/BANDAGES/DRESSINGS) IMPLANT
DRSG OPSITE POSTOP 4X8 (GAUZE/BANDAGES/DRESSINGS) IMPLANT
ELECT REM PT RETURN 15FT ADLT (MISCELLANEOUS) ×1 IMPLANT
GAUZE 4X4 16PLY ~~LOC~~+RFID DBL (SPONGE) ×1 IMPLANT
GLOVE BIO SURGEON STRL SZ 6 (GLOVE) ×2 IMPLANT
GLOVE BIO SURGEON STRL SZ 6.5 (GLOVE) ×2 IMPLANT
GOWN STRL REUS W/ TWL LRG LVL3 (GOWN DISPOSABLE) ×2 IMPLANT
GOWN STRL REUS W/TWL LRG LVL3 (GOWN DISPOSABLE) ×3
HEMOSTAT ARISTA ABSORB 3G PWDR (HEMOSTASIS) IMPLANT
KIT BASIN OR (CUSTOM PROCEDURE TRAY) ×1 IMPLANT
KIT TURNOVER KIT A (KITS) IMPLANT
LIGASURE IMPACT 36 18CM CVD LR (INSTRUMENTS) IMPLANT
LOOP VESSEL MAXI BLUE (MISCELLANEOUS) ×1 IMPLANT
NEEDLE HYPO 22GX1.5 SAFETY (NEEDLE) ×2 IMPLANT
NS IRRIG 1000ML POUR BTL (IV SOLUTION) ×2 IMPLANT
PACK GENERAL/GYN (CUSTOM PROCEDURE TRAY) ×1 IMPLANT
RELOAD PROXIMATE 75MM BLUE (ENDOMECHANICALS) IMPLANT
RELOAD PROXIMATE TA60MM BLUE (ENDOMECHANICALS) IMPLANT
RELOAD STAPLE 60 BLU REG PROX (ENDOMECHANICALS) IMPLANT
RELOAD STAPLE 75 3.8 BLU REG (ENDOMECHANICALS) IMPLANT
RETRACTOR WND ALEXIS 18 MED (MISCELLANEOUS) IMPLANT
RETRACTOR WND ALEXIS 25 LRG (MISCELLANEOUS) IMPLANT
RTRCTR WOUND ALEXIS 18CM MED (MISCELLANEOUS)
RTRCTR WOUND ALEXIS 25CM LRG (MISCELLANEOUS) ×1
SHEET LAVH (DRAPES) ×1 IMPLANT
SLEEVE SUCTION CATH 165 (SLEEVE) ×1 IMPLANT
SOL PREP POV-IOD 4OZ 10% (MISCELLANEOUS) ×1 IMPLANT
SPONGE T-LAP 18X18 ~~LOC~~+RFID (SPONGE) IMPLANT
STAPLER GUN LINEAR PROX 60 (STAPLE) IMPLANT
STAPLER PROXIMATE 75MM BLUE (STAPLE) IMPLANT
STAPLER VISISTAT 35W (STAPLE) IMPLANT
SURGIFLO W/THROMBIN 8M KIT (HEMOSTASIS) IMPLANT
SUT MNCRL AB 4-0 PS2 18 (SUTURE) ×2 IMPLANT
SUT PDS AB 0 CT1 36 (SUTURE) IMPLANT
SUT PDS AB 1 TP1 96 (SUTURE) ×2 IMPLANT
SUT SILK 2 0 (SUTURE) ×1
SUT SILK 2-0 18XBRD TIE 12 (SUTURE) ×1 IMPLANT
SUT SILK 3 0 SH CR/8 (SUTURE) IMPLANT
SUT VIC AB 0 CT1 18XCR BRD 8 (SUTURE) ×1 IMPLANT
SUT VIC AB 0 CT1 36 (SUTURE) ×4 IMPLANT
SUT VIC AB 0 CT1 8-18 (SUTURE)
SUT VIC AB 2-0 CT1 27 (SUTURE) ×4
SUT VIC AB 2-0 CT1 36 (SUTURE) ×2 IMPLANT
SUT VIC AB 2-0 CT1 TAPERPNT 27 (SUTURE) ×2 IMPLANT
SUT VIC AB 2-0 CT2 27 (SUTURE) ×2 IMPLANT
SUT VIC AB 2-0 SH 27 (SUTURE) ×6
SUT VIC AB 2-0 SH 27X BRD (SUTURE) IMPLANT
SUT VIC AB 3-0 CTX 36 (SUTURE) IMPLANT
SUT VIC AB 3-0 SH 18 (SUTURE) IMPLANT
SUT VIC AB 3-0 SH 27 (SUTURE) ×1
SUT VIC AB 3-0 SH 27X BRD (SUTURE) ×1 IMPLANT
SUT VIC AB 4-0 PS2 27 (SUTURE) IMPLANT
SUT VIC AB 4-0 SH 27 (SUTURE) ×28
SUT VIC AB 4-0 SH 27XBRD (SUTURE) ×8 IMPLANT
SYR 30ML LL (SYRINGE) ×2 IMPLANT
TOWEL OR 17X26 10 PK STRL BLUE (TOWEL DISPOSABLE) ×1 IMPLANT
TOWEL OR NON WOVEN STRL DISP B (DISPOSABLE) ×1 IMPLANT
TRAY FOLEY MTR SLVR 16FR STAT (SET/KITS/TRAYS/PACK) ×1 IMPLANT
UNDERPAD 30X36 HEAVY ABSORB (UNDERPADS AND DIAPERS) ×1 IMPLANT

## 2022-08-10 NOTE — Op Note (Addendum)
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  On intra-abdominal entry, normal upper abdominal survey, no enlarged para-aortic lymph nodes on palpation.  Normal-appearing small and large bowel.  Uterus approximately 6 cm and normal in appearance.  Normal bilateral tubes and ovaries.  No obvious adenopathy with very little lymphatic tissue in the pelvic basins.  Estimated Blood Loss:  700 cc      Total IV Fluids: see I&O flowsheet         Specimens: uterus, cervix, tubes and ovaries, parametria, anterior and posterior vaginal margin, bilateral pelvic lymph nodes         Complications:  None apparent; patient tolerated the procedure well.         Disposition: PACU - hemodynamically stable.  Procedure Details  The patient was seen in the Holding Room. The risks, benefits, complications, treatment options, and expected outcomes were discussed with the patient.  The patient concurred with the proposed plan, giving informed consent.  The site of surgery properly noted/marked. The patient was identified as Lineth Thielke and the procedure verified as a Radical hysterectomy with bilateral salpingo oophorectomy and bilateral pelvic lymphadenectomy. A Time Out was held and the above information confirmed.  After induction of anesthesia, the patient was draped and prepped in the usual sterile manner. The patient was placed in supine position in semi-lithotomy in [place].  The perineum was prepped with Betadine.  Foley catheter was placed.  A spongestick was placed vaginally to  define the vaginal fornices.  The abdominal drape was placed after the CholoraPrep had been allowed to dry for 3 minutes.    A right paramedian incision was made with a scalpel and carried down through the subcutaneous tissue with monopolar electrocautery until the fascia.  The fascia was scored with monopolar electrocautery and the posterior sheath and peritoneum were entered sharply.  The incision was extended to just lateral to the umbilicus and down to the pubic symphysis.  The Alexis retractor was placed and the Bookwalter was set up.  After the abdomen had been explored, the small bowel was packed into the upper abdomen using moist laparotomy sponges and the Bookwalter.  The right round ligament was suture ligated and divided with monopolar electrocautery. The right retroperitoneum in the pelvis was opened parallel to the IP ligament. The right paravesical space was developed with monopolar cautery and blunt dissection. The pararectal space was opened with blunt dissection to mobilize the ureter off of the medial surface of the internal iliac artery. The medial leaf of the broad ligament containing the ureter was held medially (opening the pararectal space). The pelvic lymphadenectomy was performed by skeletonizing the internal iliac artery at the bifurcation with the external iliac artery. The obturator nerve was identified in the base of lateral paravesical space. The ureter was mobilized medially off of the dissection by developing the pararectal space. The genitofemoral nerve was identified, skeletonized and mobilized laterally off of the external iliac artery. Lymph nodes were removed within the following boundaries: the mid portion of the common iliac proximally, the circumflex iliac vein distally, the obturator nerve posteriorally, the genitofemoral nerve laterally. The nodes were  handed off the field.  The same procedure was performed on the left. The left round ligament was suture ligated and  divided with monopolar electrocautery. The left retroperitoneum in the pelvis was opened parallel to the IP ligament. The left paravesical space was developed with monopolar cautery and blunt dissection. The pararectal space was opened with blunt dissection to mobilize the ureter off of the medial surface of the internal iliac artery. The medial leaf of the broad ligament containing the ureter was held medially (opening the pararectal space). The pelvic lymphadenectomy was performed by skeletonizing the internal iliac artery at the bifurcation with the external iliac artery. The obturator nerve was identified in the base of lateral paravesical space. The ureter was mobilized medially off of the dissection by developing the pararectal space. The genitofemoral nerve was identified, skeletonized and mobilized laterally off of the external iliac artery. Lymph nodes were removed within the following boundaries: the mid portion of the common iliac proximally, the circumflex iliac vein distally, the obturator nerve posteriorally, the genitofemoral nerve laterally. The nodes were handed off the field.  An incision was made in the broad ligament, superior to the ureter.  This incision was extended parallel to the main vessels.  The infundibulopelvic ligament was then cauterized with bipolar electrocautery using the LigaSure device and transected.  This was performed bilaterally.    The radical hysterectomy was begun by first skeletonizing the right uterine artery at its origin from the internal iliac artery by skeletonizing it 360 degrees and defining the parauterine web between the paravesical and pararectal spaces.  On the right, the uterine artery was tied with silk at its origin x2 and a small Clip was applied to the proximal end.  The uterine artery was then transected at its origin.  A right angle was used to mobilize the right ureter off of its attachments to the broad ligament.  It was tagged with a vessel loop.  Using meticulous blunt and sparing monopolar dissection in short controlled bursts, the right ureter was untunnelled from under the right uterine artery. The anterior vessicouterine ligament was developed using a right angle to define the tunnel of the ureter and Anderson clamps to clamp the vesicouterine ligament which was then sequentially cut and suture-ligated using 4-0 Vicryl.  The bladder flap was taken down to below the level of the cervix and below the superior aspect of the spongestick. This then definied the anterior bladder pillar. The uterine vein was sealed and resected. The lateral boundary of the parametrium was taken down with monopolar dissection to the level of the deep vaginal vein. The uterine vessels were retracted superior and medially over the ureter on the right. The ureter was untunnelled through to its entry into the bladder with meticulous blunt dissection. The ureter was dissected off of its attachments to the anterior vagina. The anterior bladder pillar was skeletonized, clamped, and suture-ligated using 4-0 Vicryl. The posterior bladder pillar was similarly skeletonized, clamped, and suture-ligated.  A similar procedure was done on the left. The left  uterine artery at its origin from the internal iliac artery by skeletonizing it 360 degrees and defining the parauterine web between the paravesical and pararectal spaces.  On the left, the uterine artery was clipped with 2 medium clips at its origin x2 and a and suture ligated.  The uterine artery was then transected at its origin between the clips and suture.  A right angle was used to mobilize the left ureter off of its attachments to the  broad ligament.  It was tagged with a vessel loop. Using meticulous blunt and sparing monopolar dissection in short controlled bursts, the left ureter was untunnelled from under the right uterine artery. The anterior vessicouterine ligament was developed using a right angle to define the tunnel of  the ureter and Anderson clamps to clamp the vesicouterine ligament which was then sequentially cut and suture-ligated using 4-0 Vicryl.  The bladder flap was taken down to below the level of the cervix and below the superior aspect of the spongestick. This then definied the anterior bladder pillar. The uterine vein was sealed and resected. The lateral boundary of the parametrium was taken down with monopolar dissection to the level of the deep vaginal vein. The uterine vessels were retracted superior and medially over the ureter on the left. The ureter was untunnelled through to its entry into the bladder with meticulous blunt dissection. The ureter was dissected off of its attachments to the anterior vagina. The anterior bladder pillar was skeletonized, clamped, and suture-ligated using 4-0 Vicryl. The posterior bladder pillar was similarly skeletonized, clamped, and suture-ligated.  The rectovaginal septum was entered posteriorally with monopolar electraocautery and the rectum was dissected off of the vagina. A window was created in the broad ligament with care to mobilize the ureter laterally. The right uterosacral ligament was transected with bipolar and monopolar energy 1/2 to 2/3rds of the way towards the uterosacral ligament insertion into the sacrum. In doing so meticulous attention was made to identify and wherever possible, spare the hypogastric nerves. The right paravaginal tissues were tubularized around the vagina on the right at the inferior boundary of the dissection.  The left uterosacral ligament was transected with bipolar and monopolar energy 1/2 to 2/3rds of the way towards the uterosacral ligament insertion into the sacrum. In doing so meticulous attention was made to identify and wherever possible, spare the hypogastric nerves. The left paravaginal tissues were tubularized around the vagina on the right at the inferior boundary of the dissection.  The lateral aspect of the vagina was  clamped on either side, cut, and suture-ligated to defined the apices of the vagina.  Curved clamps were then used to come across the vagina, below the cervix.  The cervix, uterus, and bilateral adnexa were then amputated and handed off the field.  Given what appeared to be a close anterior and posterior margin, clamps were removed from the vagina and additional 1 cm margin of vagina was taken both from the anterior and posterior aspect of the cuff.  This was done after further developing the rectovaginal septum bluntly and mobilizing the bladder further inferiorly using monopolar electrocautery.  The posterior peritoneum was then tagged to the posterior vagina using 2 figure-of-eight sutures with 0 Vicryl.  Each aspect of the cuff was then closed using 0 PDS with a Berman technique.  Some bleeding was noted from a small perforating vessel just below the suture line along the right anterior vagina.  This was suture-ligated using 2-0 Vicryl with good hemostasis noted.  Pelvis was irrigated copiously with excellent hemostasis noted.  Surgiflo was placed in both pelvic basins where nodal dissection had been performed and on the bed of the vaginal cuff.  All laparotomy sponges were removed from the abdomen.  The fascia was closed with two running looped 0 PDS, tied in the midline.  Subcutaneous tissue was irrigated Exparel injected for local anesthesia.  Subcutaneous layer was reapproximated using running 2-0 Vicryl.  The more superficial subcutaneous layer was closed with interrupted  4-0 Vicryl and the skin was closed with 4-0 Monocryl in subcuticular fashion.  Dermabond was applied to the incision as well as a honeycomb dressing.  Sponge, lap and needle counts correct x 2.  The patient was taken to the recovery room in stable condition.  The vagina was swabbed with minimal bleeding noted.  Foley catheter was left in place.  All instrument and needle counts were correct x  3.   The patient was transferred to  the recovery room in stable condition.  Jeral Pinch MD Gynecologic Oncology

## 2022-08-10 NOTE — Transfer of Care (Signed)
Immediate Anesthesia Transfer of Care Note  Patient: Katherine [name]  Procedure(s) Performed: Procedure(s): RADICAL HYSTERECTOMY, BILATERAL SALPINGO-OOPHORECTOMY (N/A) PELVIC LYMPH NODE DISSECTION (N/A)  Patient Location: PACU  Anesthesia Type:General  Level of Consciousness: Alert, Awake, Oriented  Airway & Oxygen Therapy: Patient Spontanous Breathing  Post-op Assessment: Report given to RN  Post vital signs: Reviewed and stable  Last Vitals:  Vitals:   08/10/22 0621  BP: (!) 167/79  Pulse: 64  Resp: 18  Temp: 36.8 C  SpO2: 295%    Complications: No apparent anesthesia complications

## 2022-08-10 NOTE — Interval H&P Note (Signed)
History and Physical Interval Note:  08/10/2022 7:05 AM  Katherine [name]  has presented today for surgery, with the diagnosis of CERVICAL CANCER.  The various methods of treatment have been discussed with the patient and family. After consideration of risks, benefits and other options for treatment, the patient has consented to  Procedure(s): RADICAL HYSTERECTOMY, BILATERAL SALPINGO-OOPHORECTOMY (N/A) PELVIC LYMPH NODE DISSECTION (N/A) as a surgical intervention.  The patient's history has been reviewed, patient examined, no change in status, stable for surgery.  I have reviewed the patient's chart and labs.  Questions were answered to the patient's satisfaction.     [name]

## 2022-08-10 NOTE — Anesthesia Procedure Notes (Signed)
Procedure Name: Intubation Date/Time: 08/10/2022 8:35 AM  Performed by: Gerald Leitz, CRNAPre-anesthesia Checklist: Patient identified, Patient being monitored, Timeout performed, Emergency Drugs available and Suction available Patient Re-evaluated:Patient Re-evaluated prior to induction Oxygen Delivery Method: Circle system utilized Preoxygenation: Pre-oxygenation with 100% oxygen Induction Type: IV induction Ventilation: Mask ventilation without difficulty Laryngoscope Size: Mac and 3 Grade View: Grade I Tube type: Oral Tube size: 7.0 mm Number of attempts: 1 Placement Confirmation: ETT inserted through vocal cords under direct vision, positive ETCO2 and breath sounds checked- equal and bilateral Secured at: 21 cm Tube secured with: Tape Dental Injury: Teeth and Oropharynx as per pre-operative assessment

## 2022-08-10 NOTE — Brief Op Note (Signed)
08/10/2022  2:16 PM  PATIENT:  Katherine [name]  56 y.o. female  PRE-OPERATIVE DIAGNOSIS:  CERVICAL CANCER  POST-OPERATIVE DIAGNOSIS:  CERVICAL CANCER  PROCEDURE:  Procedure(s): RADICAL HYSTERECTOMY, BILATERAL SALPINGO-OOPHORECTOMY (N/A) PELVIC LYMPH NODE DISSECTION (N/A)  SURGEON:  Surgeon(s) and Role:    [name], MD - Primary    * Lahoma Crocker, MD  ASSISTANTS: Joylene John NP   ANESTHESIA:   general  EBL:  670 mL   BLOOD ADMINISTERED:none  DRAINS: none   LOCAL MEDICATIONS USED:  MARCAINE , Exparel     SPECIMEN:  uterus, cervix, parametria, tubes and ovaries, anterior and posterior vaginal margin, bilateral pelvic lymph nodes  DISPOSITION OF SPECIMEN:  PATHOLOGY  COUNTS:  YES  TOURNIQUET:  * No tourniquets in log *  DICTATION: .Note written in EPIC  PLAN OF CARE: Admit for overnight observation  PATIENT DISPOSITION:  PACU - hemodynamically stable.   Delay start of Pharmacological VTE agent (>24hrs) due to surgical blood loss or risk of bleeding: no

## 2022-08-10 NOTE — Anesthesia Postprocedure Evaluation (Signed)
Anesthesia Post Note  Patient: Brandan Glauber  Procedure(s) Performed: RADICAL HYSTERECTOMY, BILATERAL SALPINGO-OOPHORECTOMY PELVIC LYMPH NODE DISSECTION     Patient location during evaluation: PACU Anesthesia Type: General Level of consciousness: awake Pain management: pain level controlled Vital Signs Assessment: post-procedure vital signs reviewed and stable Respiratory status: spontaneous breathing, nonlabored ventilation and respiratory function stable Cardiovascular status: blood pressure returned to baseline and stable Postop Assessment: no apparent nausea or vomiting Anesthetic complications: no   No notable events documented.  Last Vitals:  Vitals:   08/10/22 1528 08/10/22 1530  BP: (!) 94/48 (!) 105/52  Pulse: 68 63  Resp: 16 16  Temp: 36.6 C   SpO2: 94% 99%    Last Pain:  Vitals:   08/10/22 1530  TempSrc:   PainSc: 9                  Nilda Simmer

## 2022-08-11 ENCOUNTER — Encounter (HOSPITAL_COMMUNITY): Payer: Self-pay | Admitting: Gynecologic Oncology

## 2022-08-11 LAB — CBC
HCT: 24.6 % — ABNORMAL LOW (ref 36.0–46.0)
Hemoglobin: 8.4 g/dL — ABNORMAL LOW (ref 12.0–15.0)
MCH: 30.4 pg (ref 26.0–34.0)
MCHC: 34.1 g/dL (ref 30.0–36.0)
MCV: 89.1 fL (ref 80.0–100.0)
Platelets: 198 10*3/uL (ref 150–400)
RBC: 2.76 MIL/uL — ABNORMAL LOW (ref 3.87–5.11)
RDW: 13.2 % (ref 11.5–15.5)
WBC: 12.8 10*3/uL — ABNORMAL HIGH (ref 4.0–10.5)
nRBC: 0 % (ref 0.0–0.2)

## 2022-08-11 LAB — BASIC METABOLIC PANEL
Anion gap: 6 (ref 5–15)
BUN: 10 mg/dL (ref 6–20)
CO2: 23 mmol/L (ref 22–32)
Calcium: 8.3 mg/dL — ABNORMAL LOW (ref 8.9–10.3)
Chloride: 106 mmol/L (ref 98–111)
Creatinine, Ser: 0.85 mg/dL (ref 0.44–1.00)
GFR, Estimated: >60 mL/min (ref >60)
Glucose, Bld: 186 mg/dL — ABNORMAL HIGH (ref 70–99)
Potassium: 4.8 mmol/L (ref 3.5–5.1)
Sodium: 135 mmol/L (ref 135–145)

## 2022-08-11 NOTE — TOC Progression Note (Signed)
Transition of Care [facility]) - Progression Note    Patient Details  Name: Yanett Conkright MRN: [id number] Date of Birth: 02/20/1966  Transition of Care [facility]) CM/SW Contact  Servando Snare, [place] Phone Number: 08/11/2022, 9:28 AM  Clinical Narrative:     Transition of Care [facility]) Screening Note   Patient Details  Name: Xandrea Clarey Date of Birth: 04/27/1966   Transition of Care [facility]) CM/SW Contact:    Servando Snare, LCSW Phone Number: 08/11/2022, 9:30 AM    Transition of Care Department [facility]) has reviewed patient and no TOC needs have been identified at this time. We will continue to monitor patient advancement through interdisciplinary progression rounds. If new patient transition needs arise, please place a TOC consult.          Expected Discharge Plan and Services                                                 Social Determinants of Health (SDOH) Interventions    Readmission Risk Interventions     No data to display

## 2022-08-11 NOTE — Progress Notes (Signed)
1 Day Post-Op Procedure(s) (LRB): RADICAL HYSTERECTOMY, BILATERAL SALPINGO-OOPHORECTOMY (N/A) PELVIC LYMPH NODE DISSECTION (N/A)  Subjective: Patient reports incisional soreness. She ambulated in the hall last pm with assist of a walker. States she felt "weak legged" but she was able to continue ambulating. Tolerating liquids. Has not had solid food. No nausea or emesis reported. No flatus or BM. Reporting intermittent shoulder aches. Getting to 1250 on IS. Family at the bedside. No concerns voiced.   Objective: Vital signs in last 24 hours: Temp:  [96.7 F (35.9 C)-98.4 F (36.9 C)] 98.4 F (36.9 C) (11/09 0547) Pulse Rate:  [59-75] 64 (11/09 0547) Resp:  [16-21] 18 (11/09 0547) BP: (92-147)/(39-79) 133/62 (11/09 0547) SpO2:  [94 %-100 %] 99 % (11/09 0547)    Intake/Output from previous day: 11/08 0701 - 11/09 0700 In: 5415.7 [P.O.:480; I.V.:4435.7; IV Piggyback:500] Out: 4370 [Urine:3700; Blood:670]  Physical Examination: General: alert, cooperative, and no distress Resp: clear to auscultation bilaterally Cardio: regular rate and rhythm, S1, S2 normal, no murmur, click, rub or gallop GI: incision: midline incision with op site dressing in place with no drainage noted underneath, incision intact and abdomen soft, mildly hypoactive, appropriately tender. Extremities: extremities normal, atraumatic, no cyanosis or edema  Labs: WBC/Hgb/Hct/Plts:  12.8/8.4/24.6/198 (11/09 0410) BUN/Cr/glu/ALT/AST/amyl/lip:  10/0.85/--/--/--/--/-- (11/09 0410)  Assessment: 56 y.o. s/p Procedure(s): RADICAL HYSTERECTOMY, BILATERAL SALPINGO-OOPHORECTOMY PELVIC LYMPH NODE DISSECTION: stable Pain:  Pain is well-controlled on PRN medications.  Heme: Hgb 8.4 and Hct 24.6 this am-stable and appropriate compared with pre-op values, surgical losses, post op cbc. No need for transfusion at this time. Continue to monitor.   CV: BP and HR stable. Continue to monitor with ordered vital signs.  GI:   Tolerating po: liquids. Encouraged PO intake.  GU: Adequate output recorded from foley. Foley to remain in place for one week. Creatinine 0.85 this am.    FEN: No critical values this am.  Prophylaxis: SCDs and lovenox ordered.  Plan: Saline lock IV Encourage diet, ambulation, IS use Foley teaching Kpad as previously ordered Continue plan of care per Dr. Berline Lopes When meeting milestones, plan for discharge home (1-2 days)   LOS: 1 day    Alishba [place] D Morrell Fluke 08/11/2022, 7:26 AM

## 2022-08-12 DIAGNOSIS — C539 Malignant neoplasm of cervix uteri, unspecified: Principal | ICD-10-CM

## 2022-08-12 LAB — CBC
HCT: 23.9 % — ABNORMAL LOW (ref 36.0–46.0)
Hemoglobin: 8 g/dL — ABNORMAL LOW (ref 12.0–15.0)
MCH: 30.9 pg (ref 26.0–34.0)
MCHC: 33.5 g/dL (ref 30.0–36.0)
MCV: 92.3 fL (ref 80.0–100.0)
Platelets: 181 10*3/uL (ref 150–400)
RBC: 2.59 MIL/uL — ABNORMAL LOW (ref 3.87–5.11)
RDW: 13.7 % (ref 11.5–15.5)
WBC: 9.2 10*3/uL (ref 4.0–10.5)
nRBC: 0 % (ref 0.0–0.2)

## 2022-08-12 LAB — BASIC METABOLIC PANEL
Anion gap: 5 (ref 5–15)
BUN: 15 mg/dL (ref 6–20)
CO2: 23 mmol/L (ref 22–32)
Calcium: 8.8 mg/dL — ABNORMAL LOW (ref 8.9–10.3)
Chloride: 113 mmol/L — ABNORMAL HIGH (ref 98–111)
Creatinine, Ser: 0.84 mg/dL (ref 0.44–1.00)
GFR, Estimated: >60 mL/min (ref >60)
Glucose, Bld: 96 mg/dL (ref 70–99)
Potassium: 4.4 mmol/L (ref 3.5–5.1)
Sodium: 141 mmol/L (ref 135–145)

## 2022-08-12 MED ORDER — FERROUS SULFATE 325 (65 FE) MG PO TABS: 325.0000 mg | ORAL_TABLET | ORAL | 3 refills | Status: DC

## 2022-08-12 NOTE — Progress Notes (Signed)
2 Days Post-Op Procedure(s) (LRB): RADICAL HYSTERECTOMY, BILATERAL SALPINGO-OOPHORECTOMY (N/A) PELVIC LYMPH NODE DISSECTION (N/A)  Subjective: Patient reports incisional ache with no moderate pain. Had small BM yesterday and is passing flatus. No lightheadedness or dizziness when out of bed. Tolerating diet with no nausea or emesis. Trace vaginal bleeding noted yesterday. No needs voiced.  Objective: Vital signs in last 24 hours: Temp:  [97.8 F (36.6 C)-98.5 F (36.9 C)] 98.5 F (36.9 C) (11/10 0609) Pulse Rate:  [63-76] 76 (11/10 0609) Resp:  [16-18] 18 (11/10 0609) BP: (98-123)/(47-63) 123/63 (11/10 0609) SpO2:  [96 %-100 %] 96 % (11/10 0609)    Intake/Output from previous day: 11/09 0701 - 11/10 0700 In: 1440 [P.O.:1440] Out: 3050 [Urine:3050]  Physical Examination: General: alert, cooperative, and no distress Resp: clear to auscultation bilaterally Cardio: regular rate and rhythm, S1, S2 normal, no murmur, click, rub or gallop GI: incision: midline incision with op site dressing in place with no drainage noted underneath, incision intact and abdomen soft, active, appropriately tender, binder in place. Extremities: extremities normal, atraumatic, no cyanosis or edema  Labs: WBC/Hgb/Hct/Plts:  9.2/8.0/23.9/181 (11/10 7829)    Assessment: 56 y.o. s/p Procedure(s): RADICAL HYSTERECTOMY, BILATERAL SALPINGO-OOPHORECTOMY PELVIC LYMPH NODE DISSECTION: stable Pain:  Pain is well-controlled on PRN medications.  Heme: Hgb 8.0 and Hct 23.9 this am from Hgb 8.4 and Hct 24.6 on 11/9 am-stable and appropriate compared with pre-op values, surgical losses, post op cbc. Patient is asymptomatic with no need for transfusion at this time.   CV: BP and HR stable. Continue to monitor with ordered vital signs.  GI:  Tolerating po: yes. Bowel function returned.  GU: Adequate output recorded from foley. Foley to remain in place for one week. Creatinine 0.85 this am.    FEN: No critical  values this am.  Prophylaxis: SCDs and lovenox ordered.  Plan: Continue plan of care per Katherine [name] Plan for discharge to home today   LOS: 2 days    Katherine [name] 08/12/2022, 7:18 AM

## 2022-08-12 NOTE — Discharge Summary (Signed)
Physician Discharge Summary  Patient ID: Katherine [name] MRN: [id number] DOB/AGE: 58-Mar-1967 58 y.o.  Admit date: 08/10/2022 Discharge date: 08/12/2022  Admission Diagnoses: Cervical cancer [facility])  Discharge Diagnoses:  Principal Problem:   Cervical cancer [facility])   Discharged Condition:  The patient is in good condition and stable for discharge.    Hospital Course: On 08/10/2022, the patient underwent the following: Procedure(s): RADICAL HYSTERECTOMY, BILATERAL SALPINGO-OOPHORECTOMY, PELVIC LYMPH NODE DISSECTION. The postoperative course was uneventful.  She was discharged to home on postoperative day 2 tolerating a regular diet, pain controlled, passing flatus and having bowel movements, foley in place.   Consults: None  Significant Diagnostic Studies: Labs  Treatments: surgery: see above  Discharge Exam: Blood pressure 123/63, pulse 76, temperature 98.5 F (36.9 C), temperature source Oral, resp. rate 18, height 5' (1.524 m), weight 139 lb 15.9 oz (63.5 kg), SpO2 96 %. General: alert, cooperative, and no distress Resp: clear to auscultation bilaterally Cardio: regular rate and rhythm, S1, S2 normal, no murmur, click, rub or gallop GI: incision: midline incision with op site dressing in place with no drainage noted underneath, incision intact and abdomen soft, active, appropriately tender, binder in place. Extremities: extremities normal, atraumatic, no cyanosis or edema  Disposition: Discharge disposition: 01-Home or Self Care       Discharge Instructions     Call MD for:  difficulty breathing, headache or visual disturbances   Complete by: As directed    Call MD for:  extreme fatigue   Complete by: As directed    Call MD for:  hives   Complete by: As directed    Call MD for:  persistant dizziness or light-headedness   Complete by: As directed    Call MD for:  persistant nausea and vomiting   Complete by: As directed    Call MD for:  redness, tenderness, or signs of  infection (pain, swelling, redness, odor or [place]/yellow discharge around incision site)   Complete by: As directed    Call MD for:  severe uncontrolled pain   Complete by: As directed    Call MD for:  temperature >100.4   Complete by: As directed    Diet - low sodium heart healthy   Complete by: As directed    Discharge wound care:   Complete by: As directed    You will have a white honeycomb dressing over your larger incision. This dressing can be removed 5 days after surgery and you do not need to reapply a new dressing. Once you remove the dressing, you will notice that you have the surgical glue (dermabond) on the incision and this will peel off on its own. You can get this dressing wet in the shower the days after surgery prior to removal on the 5th day.   Driving Restrictions   Complete by: As directed    No driving for 2 week(s) after surgery.  Do not take narcotics and drive. You need to make sure your reaction time has returned.   Increase activity slowly   Complete by: As directed    Lifting restrictions   Complete by: As directed    No lifting greater than 10 lbs, pushing, pulling, straining for 6 weeks.   Sexual Activity Restrictions   Complete by: As directed    No sexual activity, nothing in the vagina, for 8-10 weeks.      Allergies as of 08/12/2022       Reactions   Amoxicillin Hives  Medication List     TAKE these medications    albuterol 108 (90 Base) MCG/ACT inhaler Commonly known as: VENTOLIN HFA Inhale 2 puffs into the lungs every 6 (six) hours as needed for wheezing or shortness of breath.   cholecalciferol 25 MCG (1000 UNIT) tablet Commonly known as: VITAMIN D3 Take 1,000 Units by mouth daily.   cyanocobalamin 1000 MCG tablet Commonly known as: VITAMIN B12 Take 1,000 mcg by mouth daily.   Magnesium 250 MG Tabs Take 1 tablet by mouth daily.   omeprazole 20 MG capsule Commonly known as: PRILOSEC TAKE 1 CAPSULE BY MOUTH EVERY DAY What  changed: how much to take   oxyCODONE 5 MG immediate release tablet Commonly known as: Oxy IR/ROXICODONE Take 1 tablet (5 mg total) by mouth every 4 (four) hours as needed for severe pain. For AFTER surgery only, do not take and drive   PROBIOTIC DAILY PO Take 1 capsule by mouth daily.   senna-docusate 8.6-50 MG tablet Commonly known as: Senokot-S Take 2 tablets by mouth at bedtime. For AFTER surgery, do not take if having diarrhea   sertraline 25 MG tablet Commonly known as: ZOLOFT TAKE 1 TABLET (25 MG TOTAL) BY MOUTH DAILY.   vitamin C 250 MG tablet Commonly known as: ASCORBIC ACID Take 250 mg by mouth daily.               Discharge Care Instructions  (From admission, onward)           Start     Ordered   08/12/22 0000  Discharge wound care:       Comments: You will have a white honeycomb dressing over your larger incision. This dressing can be removed 5 days after surgery and you do not need to reapply a new dressing. Once you remove the dressing, you will notice that you have the surgical glue (dermabond) on the incision and this will peel off on its own. You can get this dressing wet in the shower the days after surgery prior to removal on the 5th day.   08/12/22 3149            Follow-up Information     [name], MD Follow up on 08/18/2022.   Specialty: Gynecologic Oncology Why: at 4pm will be a PHONE visit with Dr. Berline Lopes to check in and discuss pathology. IN PERSON visit will be on 09/02/2022 at 1:45pm at the [facility] for post-op check. Contact information: [address] [place] [address] [phone]         Joylene John D, NP Follow up.   Specialty: Gynecologic Oncology Why: in one week at the [facility] for foley removal. Contact information: [address] [place] [address] [phone]                 Greater than thirty minutes were spend for face to face discharge instructions and discharge  orders/summary in EPIC.   Signed: Dorothyann Gibbs 08/12/2022, 8:45 AM

## 2022-08-12 NOTE — Progress Notes (Signed)
Nurse reviewed discharge instructions with pt.  Pt verbalized understanding of discharge instructions, follow up appointments and new medication.  Pt taught how to switch from standard drainage bag to leg bag for foley catheter.  No concerns at time of discharge.

## 2022-08-15 ENCOUNTER — Encounter: Payer: Self-pay | Admitting: *Deleted

## 2022-08-15 ENCOUNTER — Telehealth: Payer: Self-pay | Admitting: *Deleted

## 2022-08-15 NOTE — Patient Outreach (Signed)
  Care Coordination TOC Note Transition Care Management Follow-up Telephone Call Date of discharge and from where: [name] 08/12/22 How have you been since you were released from the hospital? "I'm feeling good and resting well. I haven't had to take the prescription pain medication. Tylenol and Ibuprofen have been enough to manage any pain." Any questions or concerns? No  Items Reviewed: Did the pt receive and understand the discharge instructions provided? Yes  Medications obtained and verified? Yes  Other? Yes Discussed foley catheter and management Any new allergies since your discharge? No  Dietary orders reviewed? Yes Do you have support at home? Yes   Home Care and Equipment/Supplies: Were home health services ordered? no If so, what is the name of the agency?   Has the agency set up a time to come to the patient's home? not applicable Were any new equipment or medical supplies ordered?  No What is the name of the medical supply agency?  Were you able to get the supplies/equipment? not applicable Do you have any questions related to the use of the equipment or supplies? No  Functional Questionnaire: (I = Independent and D = Dependent) ADLs: I  Bathing/Dressing- I  Meal Prep- I  Eating- I  Maintaining continence- I  Transferring/Ambulation- I  Managing Meds- I  Follow up appointments reviewed:  PCP Hospital f/u appt confirmed? No  Not indicated on discharge summary [place] [facility] f/u appt confirmed? Yes  Scheduled to see Dr Berline Lopes ([facility]) on 08/18/22 at 4:00 for telephone visit and in person visit on [facility]/02/[facility] at 1:45 [facility] Are transportation arrangements needed? No  If their condition worsens, is the pt aware to call PCP or go to the Emergency Dept.? Yes Was the patient provided with contact information for the PCP's office or ED? Yes Was to pt encouraged to call back with questions or concerns? Yes  SDOH assessments and interventions  completed:   Yes  Care Coordination Interventions Activated:  No   Care Coordination Interventions:  No Care Coordination interventions needed at this time.   Encounter Outcome:  Pt. Visit Completed    Chong Sicilian, BSN, RN-BC RN Care Coordinator [place] Direct Dial: [phone] Main #: [phone]

## 2022-08-15 NOTE — Patient Outreach (Signed)
  Care Coordination TOC Note Transition Care Management Unsuccessful Follow-up Telephone Call  Date of discharge and from where:  [facility] 08/12/22  Attempts:  1st Attempt  Reason for unsuccessful TCM follow-up call:  Left voice message  Chong Sicilian, BSN, RN-BC RN Care Coordinator [place]: [phone] Main #: [phone]

## 2022-08-17 ENCOUNTER — Telehealth: Payer: Medicare HMO | Admitting: Gynecologic Oncology

## 2022-08-18 ENCOUNTER — Inpatient Hospital Stay: Payer: Medicare HMO | Attending: Gynecologic Oncology | Admitting: Gynecologic Oncology

## 2022-08-18 ENCOUNTER — Inpatient Hospital Stay (HOSPITAL_BASED_OUTPATIENT_CLINIC_OR_DEPARTMENT_OTHER): Payer: Medicare HMO

## 2022-08-18 ENCOUNTER — Encounter: Payer: Self-pay | Admitting: Gynecologic Oncology

## 2022-08-18 VITALS — BP 124/77 | HR 68 | Temp 97.8°F | Resp 16 | Ht 60.0 in | Wt 137.4 lb

## 2022-08-18 DIAGNOSIS — Z90722 Acquired absence of ovaries, bilateral: Secondary | ICD-10-CM

## 2022-08-18 DIAGNOSIS — Z7189 Other specified counseling: Secondary | ICD-10-CM

## 2022-08-18 DIAGNOSIS — Z90710 Acquired absence of both cervix and uterus: Secondary | ICD-10-CM

## 2022-08-18 DIAGNOSIS — C539 Malignant neoplasm of cervix uteri, unspecified: Secondary | ICD-10-CM

## 2022-08-18 NOTE — Progress Notes (Signed)
Gynecologic Oncology Telehealth Note: Gyn-Onc  I connected with Katherine [name] on 08/18/22 at  4:00 PM EST by telephone and verified that I am speaking with the correct person using two identifiers.  I discussed the limitations, risks, security and privacy concerns of performing an evaluation and management service by telemedicine and the availability of in-person appointments. I also discussed with the patient that there may be a patient responsible charge related to this service. The patient expressed understanding and agreed to proceed.  Other persons participating in the visit and their role in the encounter: none.  Patient's location: home Provider's location: [facility]  Reason for Visit: follow-up after surgery  Treatment History: Oncology History Overview Note  Pap test was performed on 05/23/22 and showed squamous cell carcinoma, + HPV 16.   Malignant neoplasm of cervix ([place])  06/07/2022 Initial Diagnosis   Malignant neoplasm of cervix ([place])   06/17/2022 Initial Biopsy   Cervical biopsies, ECC  A. 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 CERVIX, 12:00, BIOPSY:  Microinvasive squamous cell carcinoma (0.25 mm depth) arising within  severe dysplasia to carcinoma in situ  Marked chronic and follicular cervicitis with squamous metaplasia   C. ENDOCERVICAL, CURRETAGE:  Numerous detached fragments of squamous cell carcinoma and severe  squamous dysplasia    06/29/2022 Imaging   PET: 1. Very low-grade activity in the vicinity of the cervix, maximum SUV 4.1, possibly reflecting the cervical malignancy. No findings of metastatic disease. 2. Small but hypermetabolic paratracheal, bilateral hilar/infrahilar, and subcarinal lymph nodes, at least one of which is partially calcified, raising some suspicion for granulomatous process such as  sarcoidosis, although strictly speaking, lymphoma is not excluded. 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 3. Accentuated palatine tonsillar uptake bilaterally, probably physiologic but technically nonspecific. There is also a small but mildly hypermetabolic left level IIa lymph node with maximum SUV of 4.5. 4. Other imaging findings of potential clinical significance: Acute on chronic paranasal sinusitis. Mild cardiomegaly. Simple appearing adnexal cysts warrant no further workup.   06/30/2022 Surgery   [facility], ECC  A. CERVIX, CONE:  - Invasive moderately differentiated squamous cell carcinoma arising in  background of extensive carcinoma in situ involving underlying  endocervical glands  - Invasive carcinoma is present in 6-9 and 9-12 o'clock quadrants while  12-3 o'clock and 3-6 o'clock quadrants show presence of carcinoma in  situ  - Carcinoma invades for a depth of at least 0.5 cm (6-9 o'clock  quadrant)  - The deep resection margin is widely positive for carcinoma (6-9  o'clock quadrant)  - All lateral mucosal margins are negative for invasive carcinoma  - Ecto- and endocervical margins in 12-3 o'clock, 3-6 o'clock and 6-9  o'clock quadrants are positive for carcinoma in situ; mucosal margins in  the 9-12 o'clock quadrants are negative   B. ENDOCERVIX, POST CONE, CURETTAGE:  - At least squamous cell carcinoma in situ  - Focal invasive carcinoma cannot be ruled out     Surgery   Type III radical hysterectomy with bilateral salpingoophorectomy and bilateral pelvic lymphadenectomy   On EUA, well-healed cervix, mildly firm suspected secondary to recent cold knife cone procedure.  On intra-abdominal entry, normal upper abdominal survey,  no enlarged para-aortic lymph nodes on palpation.   Normal-appearing small and large bowel.  Uterus approximately 6 cm and normal in appearance.  Normal bilateral tubes and ovaries.  No obvious adenopathy with very little lymphatic tissue in the pelvic basins.     Pathologic Stage   A. LYMPH NODE, LEFT PELVIC, EXCISION: - Negative for carcinoma (0/1)  B. LYMPH NODE, RIGHT PELVIC, EXCISION: - Negative for carcinoma (0/1)  C. 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 POSTERIOR MARGIN: - Negative for dysplasia or carcinoma  E. ANTERIOR MARGIN: - Negative for dysplasia or carcinoma  COMMENT:  Multiple levels were examined in the sections from the cervix to rule out microinvasion, and no foci of invasion were identified. Appropriately controlled p16 immunohistochemical stain is positive in the tumor. Slides from part C were reviewed with Dr. Saralyn Pilar who agrees with the above interpretation.      Interval History: Catheter removed this morning without issues.  Doing well, some soreness. Improving. Denies vaginal bleeding. Reports bowel movements improving.   Past Medical/Surgical History: Past Medical History:  Diagnosis Date   Asthma, mild    followed by pcp   Chronic kidney disease 2008   surgery for Kidney blockage   Depression    GAD (generalized anxiety disorder)    GERD (gastroesophageal reflux disease)    History of hiatal hernia    History of obstruction of ureter 2005   s/p  left ureteral stent (per pt not due to stone,   resolved)   Malignant neoplasm cervix ([place]) 06/07/2022   SCC   Pneumonia    Wears dentures    full upper   Wears glasses     Past Surgical History:  Procedure Laterality Date   CERVICAL CONIZATION W/BX N/A 06/30/2022   Procedure: CONIZATION CERVIX WITH BIOPSY; POST CONE ENDOCERVICAL CURRETTAGE;  Surgeon: [name], MD;  Location: [facility];  Service: Gynecology;  Laterality: N/A;   CESAREAN SECTION  1993   COLONOSCOPY WITH PROPOFOL  06/21/2022   COLONOSCOPY WITH PROPOFOL N/A 06/21/2022   Procedure: COLONOSCOPY WITH PROPOFOL;  Surgeon: [name], DO;  [facility];  Service: Endoscopy;  Laterality: N/A;  8:45 am ASA 2   CYSTOSCOPY WITH RETROGRADE PYELOGRAM, URETEROSCOPY AND STENT PLACEMENT Left 2005   for left upj obstructions (not due to stone)   ESOPHAGOGASTRODUODENOSCOPY (EGD) WITH PROPOFOL N/A 06/21/2022   Procedure: ESOPHAGOGASTRODUODENOSCOPY (EGD) WITH PROPOFOL;  Surgeon: [name], DO;  [facility];  Service: Endoscopy;  Laterality: N/A;   LAPAROSCOPIC TUBAL LIGATION Bilateral 1998   LYMPH NODE DISSECTION N/A 08/10/2022   Procedure: PELVIC LYMPH NODE DISSECTION;  Surgeon: [name], MD;  [facility]: [facility];  Service: Gynecology;  Laterality: N/A;   ORIF ANKLE FRACTURE Right 2010   has retained hardware   POLYPECTOMY  06/21/2022   Procedure: POLYPECTOMY;  Surgeon: [name], DO;  [facility];  Service: Endoscopy;;   RADICAL HYSTERECTOMY N/A 08/10/2022   Procedure: RADICAL HYSTERECTOMY, BILATERAL SALPINGO-OOPHORECTOMY;  Surgeon: [name], MD;  Location: [facility];  Service: Gynecology;  Laterality: N/A;    Family History  Problem Relation Age of Onset   Hypothyroidism Mother    Hypertension Mother    Anxiety disorder Mother    Arthritis Mother    Hypertension Father    Stroke Father    Hypothyroidism Sister    ADD / ADHD Sister    Asthma Sister    ADD / ADHD Sister    ADD /  ADHD Sister    ADD / ADHD Brother    ADD / ADHD Brother    Colon cancer Brother    Depression Daughter    Depression Daughter    Colon polyps Neg Hx    Breast cancer Neg Hx    Ovarian cancer Neg Hx    Endometrial cancer Neg Hx    Pancreatic cancer Neg Hx    Prostate cancer Neg Hx     Social History   Socioeconomic History   Marital status: Divorced    Spouse name: Not on file   Number of children: 4   Years of education: Not on file   Highest education level: 10th grade  Occupational History   Not on file  Tobacco Use   Smoking status: Never   Smokeless tobacco: Never  Vaping Use   Vaping Use: Never used  Substance and Sexual Activity   Alcohol use: Not Currently    Comment: seldom   Drug use: Never   Sexual activity: Yes    Partners: Male    Birth control/protection: Surgical  Other Topics Concern   Not on file  Social History Narrative   Lives alone   4 children-one in [place], [place], [place], and [place] in the [place] children 2      Enjoy: spending time with family, children visit often       Diets: eats all food groups    Caffeine: 1 cup coffee twice week   Water: 6-8 cups      Wears seat belt    Does not use phone while driving-handfree   Smoke detectors at home   No weapons in the house    Social Determinants of Health   Financial Resource Strain: [place]  (08/15/2022)   Overall Financial Resource Strain (CARDIA)    Difficulty of Paying Living Expenses: Not very hard  Food Insecurity: No Food Insecurity (08/11/2022)   Hunger Vital Sign    Worried About Running Out of Food in the Last Year: Never true    Ran Out of Food in the Last Year: Never true  Transportation Needs: No Transportation Needs (08/15/2022)   PRAPARE - Hydrologist (Medical): No    Lack of Transportation (Non-Medical): No  Physical Activity: Inactive (04/25/2022)   Exercise Vital Sign    Days of Exercise per Week: 0 days    Minutes of  Exercise per Session: 0 min  Stress: Stress Concern Present (04/25/2022)   [place]    Feeling of Stress : Rather much  Social Connections: Unknown (04/25/2022)   Social Connection and Isolation Panel [NHANES]    Frequency of Communication with Friends and Family: Once a week    Frequency of Social Gatherings with Friends and Family: Once a week    Attends Religious Services: Not on Diplomatic Services operational officer of Clubs or Organizations: No    Attends Archivist Meetings: Never    Marital Status: Divorced    Current Medications:  Current Outpatient Medications:    albuterol (VENTOLIN HFA) 108 (90 Base) MCG/ACT inhaler, Inhale 2 puffs into the lungs every 6 (six) hours as needed for wheezing or shortness of breath., Disp: , Rfl:    cholecalciferol (VITAMIN D3) 25 MCG (1000 UNIT) tablet, Take 1,000 Units by mouth daily., Disp: , Rfl:    cyanocobalamin (VITAMIN B12) 1000 MCG tablet, Take 1,000 mcg by mouth daily., Disp: , Rfl:    ferrous sulfate (FERROUSUL) 325 (65 FE) MG tablet, Take 1 tablet (325 mg total) by mouth every other day., Disp: 30 tablet, Rfl: 3   Magnesium 250 MG TABS, Take 1 tablet by mouth daily., Disp: , Rfl:    omeprazole (PRILOSEC) 20 MG capsule, TAKE 1 CAPSULE BY MOUTH EVERY DAY (Patient taking differently: Take 20 mg by mouth daily.), Disp: 90 capsule, Rfl: 1   oxyCODONE (OXY IR/ROXICODONE) 5 MG immediate release tablet, Take 1 tablet (5 mg total) by mouth every 4 (four) hours as needed for severe pain. For AFTER surgery only, do not take and drive, Disp: 15 tablet, Rfl: 0   Probiotic Product (PROBIOTIC DAILY PO), Take 1 capsule by mouth daily., Disp: , Rfl:    senna-docusate (SENOKOT-S) 8.6-50 MG tablet, Take 2 tablets by mouth at bedtime. For AFTER surgery, do not take if having diarrhea, Disp: 30 tablet, Rfl: 0   sertraline (ZOLOFT) 25 MG tablet, TAKE 1 TABLET (25 MG TOTAL) BY MOUTH DAILY., Disp: 90  tablet, Rfl: 2   vitamin C (ASCORBIC ACID) 250 MG tablet, Take 250 mg by mouth daily., Disp: , Rfl:   Review of Symptoms: Pertinent positives as per HPI.  Physical Exam: Deferred given limitations of phone visit.  Laboratory & Radiologic Studies: A. LYMPH NODE, LEFT PELVIC, EXCISION:  - Negative for carcinoma (0/1)   B. LYMPH NODE, RIGHT PELVIC, EXCISION:  - Negative for carcinoma (0/1)   C. 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 POSTERIOR MARGIN:  - Negative for dysplasia or carcinoma   E. ANTERIOR MARGIN:  - Negative for dysplasia or carcinoma   Assessment & Plan: Katherine [name] is a 56 y.o. 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  Doing well, meeting milestones. Expectations reviewed.  Discussed pathology. No residual invasive cancer in hysterectomy specimen, just CIS. Will clarify that all surgical margins are negative. Discussed no need for additional treatment. We will review surveillance plan when I see her for follow-up ina  couple of weeks.  I discussed the assessment and treatment plan with the patient. The patient was provided with an opportunity to ask questions and all were answered. The patient agreed with the plan and demonstrated an understanding of the instructions.   The patient was advised to call back or see an in-person evaluation if the  symptoms worsen or if the condition fails to improve as anticipated.   9 minutes of total time was spent for this patient encounter, including preparation, phone counseling  with the patient and coordination of care, and documentation of the encounter.   Jeral Pinch, MD  Division of Gynecologic Oncology  Department of Obstetrics and Gynecology  [facility]

## 2022-08-18 NOTE — Progress Notes (Signed)
Katherine [name] came in today for foley removal and void trial. Patient tolerated procedure well. 115m of sterile water instilled into catheter. Foley removed and intact. Patient immediately felt the urge to pee and was able to void 125 ml. Patient denies any symptoms of dysuria. Reviewed with patient to call the office if she has any concerns. Patient verbalized understanding and had no concerns at this time.

## 2022-08-29 ENCOUNTER — Ambulatory Visit (INDEPENDENT_AMBULATORY_CARE_PROVIDER_SITE_OTHER): Payer: Medicare HMO | Admitting: Internal Medicine

## 2022-08-29 ENCOUNTER — Encounter: Payer: Self-pay | Admitting: Internal Medicine

## 2022-08-29 VITALS — BP 144/84 | HR 71 | Ht 60.0 in | Wt 136.2 lb

## 2022-08-29 DIAGNOSIS — H9313 Tinnitus, bilateral: Secondary | ICD-10-CM | POA: Diagnosis not present

## 2022-08-29 DIAGNOSIS — F339 Major depressive disorder, recurrent, unspecified: Secondary | ICD-10-CM

## 2022-08-29 DIAGNOSIS — C539 Malignant neoplasm of cervix uteri, unspecified: Secondary | ICD-10-CM | POA: Diagnosis not present

## 2022-08-29 DIAGNOSIS — E782 Mixed hyperlipidemia: Secondary | ICD-10-CM | POA: Diagnosis not present

## 2022-08-29 DIAGNOSIS — I10 Essential (primary) hypertension: Secondary | ICD-10-CM

## 2022-08-29 DIAGNOSIS — D509 Iron deficiency anemia, unspecified: Secondary | ICD-10-CM | POA: Diagnosis not present

## 2022-08-29 DIAGNOSIS — J4530 Mild persistent asthma, uncomplicated: Secondary | ICD-10-CM

## 2022-08-29 MED ORDER — LOSARTAN POTASSIUM 25 MG PO TABS: 25.0000 mg | ORAL_TABLET | Freq: Every day | ORAL | 3 refills | Status: DC

## 2022-08-29 NOTE — Assessment & Plan Note (Signed)
Well-controlled currently On Advair and albuterol PRN

## 2022-08-29 NOTE — Assessment & Plan Note (Signed)
Likely from loud noise exposure in the past Ear exam appears benign If persistent concern, will refer to ENT specialist

## 2022-08-29 NOTE — Progress Notes (Signed)
Established Patient Office Visit  Subjective:  Patient ID: Katherine [name], female    DOB: 1966/02/11  Age: 56 y.o. MRN: [id number]  CC:  Chief Complaint  Patient presents with   Follow-up    Follow up    HPI Katherine [name] is a 56 y.o. female with past medical history of HTN, GERD, asthma and MDD who presents for f/u of her chronic medical conditions.  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  She is followed by Dr. Berline Lopes for it.  She denies any vaginal discharge or bleeding currently.  She had drop in Hb, up to 8.0 from ~13 (baseline).  She has started taking iron supplement for it.  She denies any fatigue or exertional dyspnea currently.  HTN: Her BP was elevated today.  She has history of elevated BP in previous office visits.  She currently denies any headache, dizziness, chest pain, dyspnea or palpitations.  GERD: She takes Omeprazole for it.  Denies any nausea, vomiting or epigastric pain now.  MDD: She has noticed improvement with Zoloft.  Denies any anhedonia or insomnia currently.  Asthma: She has albuterol as needed for dyspnea or wheezing.  She also has advair inhaler.  Denies any recent worsening of dyspnea or wheezing.  She complains of bilateral ringing sound in the ears, which is intermittent.  She reports history of exposure to loud noises at work in the past.  Currently denies any hearing difficulty, dizziness or ear discharge.  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   Pneumonia    Wears dentures    full upper   Wears glasses     Past Surgical History:   Procedure Laterality Date   CERVICAL CONIZATION W/BX N/A 06/30/2022   Procedure: CONIZATION CERVIX WITH BIOPSY; POST CONE ENDOCERVICAL CURRETTAGE;  Surgeon: [name], MD;  Location[facility];  Service: Gynecology;  Laterality: N/A;   CESAREAN SECTION  1993   COLONOSCOPY WITH PROPOFOL  06/21/2022   COLONOSCOPY WITH PROPOFOL N/A 06/21/2022   Procedure: COLONOSCOPY WITH PROPOFOL;  Surgeon: [name], DO;  Location: [facility];  Service: Endoscopy;  Laterality: N/A;  8:45 am ASA 2   CYSTOSCOPY WITH RETROGRADE PYELOGRAM, URETEROSCOPY AND STENT PLACEMENT Left 2005   for left upj obstructions (not due to stone)   ESOPHAGOGASTRODUODENOSCOPY (EGD) WITH PROPOFOL N/A 06/21/2022   Procedure: ESOPHAGOGASTRODUODENOSCOPY (EGD) WITH PROPOFOL;  Surgeon: [name], DO;  Location: [facility];  Service: Endoscopy;  Laterality: N/A;   LAPAROSCOPIC TUBAL LIGATION Bilateral 1998   LYMPH NODE DISSECTION N/A 08/10/2022   Procedure: PELVIC LYMPH NODE DISSECTION;  Surgeon: [name], MD;  Location: [facility];  Service: Gynecology;  Laterality: N/A;   ORIF ANKLE FRACTURE Right 2010   has retained hardware   POLYPECTOMY  06/21/2022   Procedure: POLYPECTOMY;  Surgeon: [name], DO;  Location: [facility];  Service: Endoscopy;;   RADICAL HYSTERECTOMY N/A 08/10/2022   Procedure: RADICAL HYSTERECTOMY, BILATERAL SALPINGO-OOPHORECTOMY;  Surgeon: [name], MD;  Location: [facility];  Service: Gynecology;  Laterality: N/A;    Family History  Problem Relation Age of Onset   Hypothyroidism Mother    Hypertension Mother    Anxiety disorder Mother    Arthritis Mother    Hypertension Father    Stroke Father    Hypothyroidism Sister    ADD / ADHD Sister    Asthma Sister    ADD / ADHD Sister    ADD / ADHD Sister    ADD / ADHD Brother    ADD / ADHD Brother    Colon cancer Brother    Depression Daughter    Depression Daughter    Colon polyps Neg Hx     Breast cancer Neg Hx    Ovarian cancer Neg Hx    Endometrial cancer Neg Hx    Pancreatic cancer Neg Hx    Prostate cancer Neg Hx     Social History   Socioeconomic History   Marital status: Divorced    Spouse name: Not on file   Number of children: 4   Years of education: Not on file   Highest education level: 10th grade  Occupational History   Not on file  Tobacco Use   Smoking status: Never   Smokeless tobacco: Never  Vaping Use   Vaping Use: Never used  Substance and Sexual Activity   Alcohol use: Not Currently    Comment: seldom   Drug use: Never   Sexual activity: Yes    Partners: Male    Birth control/protection: Surgical  Other Topics Concern   Not on file  Social History Narrative   Lives alone   4 children-one in [place], [place], [place], and [place] in the [place] children 2      Enjoy: spending time with family, children visit often       Diets: eats all food groups    Caffeine: 1 cup coffee twice week   Water: 6-8 cups      Wears seat belt    Does not use phone while driving-handfree   Smoke detectors at home   No weapons in the house    Social Determinants of Health   Financial Resource Strain: [place]  (08/15/2022)   Overall Financial Resource Strain (CARDIA)    Difficulty of Paying Living Expenses: Not very hard  Food Insecurity: No Food Insecurity (08/11/2022)   Hunger Vital Sign    Worried About Running Out of Food in the Last Year: Never true    Ran Out of Food in the Last Year: Never true  Transportation Needs: No Transportation Needs (08/15/2022)   PRAPARE - Hydrologist (Medical): No    Lack of Transportation (Non-Medical): No  Physical Activity: Inactive (04/25/2022)   Exercise Vital Sign    Days of Exercise per Week: 0 days    Minutes of Exercise per Session: 0 min  Stress: Stress Concern Present (04/25/2022)   [place]     Feeling of Stress : Rather much  Social Connections: Unknown (04/25/2022)   Social Connection and Isolation Panel [NHANES]    Frequency of Communication with Friends and Family: Once a week    Frequency of Social Gatherings with Friends and Family: Once a week    Attends Religious Services: Not on Diplomatic Services operational officer of Clubs or Organizations: No    Attends Archivist Meetings: Never    Marital  Status: Divorced  Human resources officer Violence: Not At Risk (08/11/2022)   Humiliation, Afraid, Rape, and Kick questionnaire    Fear of Current or Ex-Partner: No    Emotionally Abused: No    Physically Abused: No    Sexually Abused: No    Outpatient Medications Prior to Visit  Medication Sig Dispense Refill   albuterol (VENTOLIN HFA) 108 (90 Base) MCG/ACT inhaler Inhale 2 puffs into the lungs every 6 (six) hours as needed for wheezing or shortness of breath.     cholecalciferol (VITAMIN D3) 25 MCG (1000 UNIT) tablet Take 1,000 Units by mouth daily.     cyanocobalamin (VITAMIN B12) 1000 MCG tablet Take 1,000 mcg by mouth daily.     ferrous sulfate (FERROUSUL) 325 (65 FE) MG tablet Take 1 tablet (325 mg total) by mouth every other day. 30 tablet 3   fluticasone-salmeterol (ADVAIR HFA) 230-21 MCG/ACT inhaler Inhale 2 puffs into the lungs 2 (two) times daily.     Magnesium 250 MG TABS Take 1 tablet by mouth daily.     omeprazole (PRILOSEC) 20 MG capsule TAKE 1 CAPSULE BY MOUTH EVERY DAY (Patient taking differently: Take 20 mg by mouth daily.) 90 capsule 1   Probiotic Product (PROBIOTIC DAILY PO) Take 1 capsule by mouth daily.     senna-docusate (SENOKOT-S) 8.6-50 MG tablet Take 2 tablets by mouth at bedtime. For AFTER surgery, do not take if having diarrhea 30 tablet 0   sertraline (ZOLOFT) 25 MG tablet TAKE 1 TABLET (25 MG TOTAL) BY MOUTH DAILY. 90 tablet 2   vitamin C (ASCORBIC ACID) 250 MG tablet Take 250 mg by mouth daily.     oxyCODONE (OXY IR/ROXICODONE) 5 MG immediate release tablet  Take 1 tablet (5 mg total) by mouth every 4 (four) hours as needed for severe pain. For AFTER surgery only, do not take and drive (Patient not taking: Reported on 08/29/2022) 15 tablet 0   No facility-administered medications prior to visit.    Allergies  Allergen Reactions   Amoxicillin Hives    ROS Review of Systems  Constitutional:  Negative for chills and fever.  HENT:  Positive for tinnitus. Negative for congestion, ear discharge, ear pain, hearing loss, sinus pressure, sinus pain and sore throat.   Eyes:  Negative for pain and discharge.  Respiratory:  Negative for cough and shortness of breath.   Cardiovascular:  Negative for chest pain and palpitations.  Gastrointestinal:  Negative for constipation, nausea and vomiting.  Endocrine: Negative for polydipsia and polyuria.  Genitourinary:  Negative for dysuria and hematuria.  Musculoskeletal:  Negative for neck pain and neck stiffness.  Skin:  Negative for rash.  Neurological:  Negative for dizziness and weakness.  Psychiatric/Behavioral:  Negative for agitation and behavioral problems.       Objective:    Physical Exam Vitals reviewed.  Constitutional:      General: She is not in acute distress.    Appearance: She is not diaphoretic.  HENT:     Head: Normocephalic and atraumatic.     Right Ear: Ear canal and external ear normal. There is no impacted cerumen.     Left Ear: Ear canal and external ear normal. There is no impacted cerumen.     Mouth/Throat:     Mouth: Mucous membranes are moist.  Eyes:     General: No scleral icterus.    Extraocular Movements: Extraocular movements intact.  Cardiovascular:     Rate and Rhythm: Normal rate and regular rhythm.  Pulses: Normal pulses.     Heart sounds: Normal heart sounds. No murmur heard. Pulmonary:     Breath sounds: Normal breath sounds. No wheezing or rales.  Musculoskeletal:     Cervical back: Neck supple. No tenderness.     Right lower leg: No edema.      Left lower leg: No edema.  Skin:    General: Skin is warm.     Findings: No rash.  Neurological:     General: No focal deficit present.     Mental Status: She is alert and oriented to person, place, and time.     Sensory: No sensory deficit.     Motor: No weakness.  Psychiatric:        Mood and Affect: Mood normal.        Behavior: Behavior normal.     BP (!) 144/84 (BP Location: Left Arm, Cuff Size: Normal)   Pulse 71   Ht 5' (1.524 m)   Wt 136 lb 3.2 oz (61.8 kg)   SpO2 98%   BMI 26.60 kg/m  Wt Readings from Last 3 Encounters:  08/29/22 136 lb 3.2 oz (61.8 kg)  08/18/22 137 lb 6.4 oz (62.3 kg)  08/10/22 139 lb 15.9 oz (63.5 kg)    Lab Results  Component Value Date   TSH 2.350 04/27/2022   Lab Results  Component Value Date   WBC 9.2 08/12/2022   HGB 8.0 (L) 08/12/2022   HCT 23.9 (L) 08/12/2022   MCV 92.3 08/12/2022   PLT 181 08/12/2022   Lab Results  Component Value Date   NA 141 08/12/2022   K 4.4 08/12/2022   CO2 23 08/12/2022   GLUCOSE 96 08/12/2022   BUN 15 08/12/2022   CREATININE 0.84 08/12/2022   BILITOT 0.6 08/05/2022   ALKPHOS 122 08/05/2022   AST 28 08/05/2022   ALT 20 08/05/2022   PROT 7.8 08/05/2022   ALBUMIN 4.4 08/05/2022   CALCIUM 8.8 (L) 08/12/2022   ANIONGAP 5 08/12/2022   EGFR 97 04/27/2022   Lab Results  Component Value Date   CHOL 248 (H) 04/27/2022   Lab Results  Component Value Date   HDL 76 04/27/2022   Lab Results  Component Value Date   LDLCALC 147 (H) 04/27/2022   Lab Results  Component Value Date   TRIG 145 04/27/2022   Lab Results  Component Value Date   CHOLHDL 3.3 04/27/2022   Lab Results  Component Value Date   HGBA1C 5.5 04/27/2022      Assessment & Plan:   Problem List Items Addressed This Visit       Cardiovascular and Mediastinum   Essential hypertension - Primary    BP Readings from Last 1 Encounters:  08/29/22 (!) 144/84  New onset Started losartan 25 mg QD Check BMP after 2  weeks Counseled for compliance with the medications Advised DASH diet and moderate exercise/walking, at least 150 mins/week      Relevant Medications   losartan (COZAAR) 25 MG tablet   Other Relevant Orders   CBC with Differential/Platelet   Basic Metabolic Panel (BMET)     Respiratory   Mild persistent asthma    Well-controlled currently On Advair and albuterol PRN      Relevant Medications   fluticasone-salmeterol (ADVAIR HFA) 230-21 MCG/ACT inhaler     Genitourinary   Malignant neoplasm of cervix (HCC)    S/p radical hysterectomy Followed by Dr. Berline Lopes        Other   Depression,  recurrent ([place])    Well-controlled with Zoloft      Tinnitus of both ears    Likely from loud noise exposure in the past Ear exam appears benign If persistent concern, will refer to ENT specialist      Mixed hyperlipidemia    Lipid profile reviewed Advised to follow DASH diet for now      Relevant Medications   losartan (COZAAR) 25 MG tablet   Other Relevant Orders   Lipid Profile   Iron deficiency anemia    Acute blood loss anemia from radical hysterectomy Oral iron supplement now Check CBC      Relevant Orders   CBC with Differential/Platelet    Meds ordered this encounter  Medications   losartan (COZAAR) 25 MG tablet    Sig: Take 1 tablet (25 mg total) by mouth daily.    Dispense:  30 tablet    Refill:  3    Follow-up: Return in about 6 weeks (around 10/10/2022) for HTN.    Lindell Spar, MD

## 2022-08-29 NOTE — Assessment & Plan Note (Signed)
Well-controlled with Zoloft °

## 2022-08-29 NOTE — Assessment & Plan Note (Addendum)
BP Readings from Last 1 Encounters:  08/29/22 (!) 144/84   New onset Started losartan 25 mg QD Check BMP after 2 weeks Counseled for compliance with the medications Advised DASH diet and moderate exercise/walking, at least 150 mins/week

## 2022-08-29 NOTE — Assessment & Plan Note (Signed)
Lipid profile reviewed Advised to follow DASH diet for now

## 2022-08-29 NOTE — Assessment & Plan Note (Signed)
Acute blood loss anemia from radical hysterectomy Oral iron supplement now Check CBC

## 2022-08-29 NOTE — Patient Instructions (Signed)
Please start taking Losartan as prescribed.  Please continue taking other medications as prescribed.  Please follow DASH diet and perform moderate exercise/walking at least 150 mins/week.

## 2022-08-29 NOTE — Assessment & Plan Note (Signed)
S/p radical hysterectomy Followed by Dr. Berline Lopes

## 2022-08-30 ENCOUNTER — Encounter: Payer: Medicare HMO | Admitting: Gynecologic Oncology

## 2022-09-02 ENCOUNTER — Encounter: Payer: Self-pay | Admitting: Gynecologic Oncology

## 2022-09-02 ENCOUNTER — Inpatient Hospital Stay: Payer: Medicare HMO | Attending: Gynecologic Oncology | Admitting: Gynecologic Oncology

## 2022-09-02 VITALS — BP 155/75 | HR 71 | Temp 98.5°F | Resp 17 | Ht 60.0 in | Wt 133.0 lb

## 2022-09-02 DIAGNOSIS — Z90710 Acquired absence of both cervix and uterus: Secondary | ICD-10-CM

## 2022-09-02 DIAGNOSIS — R1900 Intra-abdominal and pelvic swelling, mass and lump, unspecified site: Secondary | ICD-10-CM

## 2022-09-02 DIAGNOSIS — Z7189 Other specified counseling: Secondary | ICD-10-CM

## 2022-09-02 DIAGNOSIS — Z90722 Acquired absence of ovaries, bilateral: Secondary | ICD-10-CM

## 2022-09-02 DIAGNOSIS — C539 Malignant neoplasm of cervix uteri, unspecified: Secondary | ICD-10-CM

## 2022-09-02 NOTE — Progress Notes (Signed)
Gynecologic Oncology Return Clinic Visit  09/02/22  Reason for Visit: Follow-up after surgery, treatment planning  Treatment History: Oncology History Overview Note  Pap test was performed on 05/23/22 and showed squamous cell carcinoma, + HPV 16.   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 CERVIX, 12:00, BIOPSY:  Microinvasive squamous cell carcinoma (0.25 mm depth) arising within  severe dysplasia to carcinoma in situ  Marked chronic and follicular cervicitis with squamous metaplasia   C. ENDOCERVICAL, CURRETAGE:  Numerous detached fragments of squamous cell carcinoma and severe  squamous dysplasia    06/29/2022 Imaging   PET: 1. Very low-grade activity in the vicinity of the cervix, maximum SUV 4.1, possibly reflecting the cervical malignancy. No findings of metastatic disease. 2. Small but hypermetabolic paratracheal, bilateral hilar/infrahilar, and subcarinal lymph nodes, at least one of which is partially calcified, raising some suspicion for granulomatous process such as sarcoidosis, although strictly speaking, lymphoma is not excluded. 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 3. Accentuated palatine tonsillar uptake bilaterally, probably physiologic but technically nonspecific. There is also a small but mildly hypermetabolic left level IIa lymph node with maximum SUV of 4.5. 4. Other imaging findings of potential clinical significance: Acute on chronic paranasal sinusitis. Mild  cardiomegaly. Simple appearing adnexal cysts warrant no further workup.   06/30/2022 Surgery   [facility], ECC  A. CERVIX, CONE:  - Invasive moderately differentiated squamous cell carcinoma arising in  background of extensive carcinoma in situ involving underlying  endocervical glands  - Invasive carcinoma is present in 6-9 and 9-12 o'clock quadrants while  12-3 o'clock and 3-6 o'clock quadrants show presence of carcinoma in  situ  - Carcinoma invades for a depth of at least 0.5 cm (6-9 o'clock  quadrant)  - The deep resection margin is widely positive for carcinoma (6-9  o'clock quadrant)  - All lateral mucosal margins are negative for invasive carcinoma  - Ecto- and endocervical margins in 12-3 o'clock, 3-6 o'clock and 6-9  o'clock quadrants are positive for carcinoma in situ; mucosal margins in  the 9-12 o'clock quadrants are negative   B. ENDOCERVIX, POST CONE, CURETTAGE:  - At least squamous cell carcinoma in situ  - Focal invasive carcinoma cannot be ruled out     Surgery   Type III radical hysterectomy with bilateral salpingoophorectomy and bilateral pelvic lymphadenectomy   On EUA, well-healed cervix, mildly firm suspected secondary to recent cold knife cone procedure.  On intra-abdominal entry, normal upper abdominal survey, no enlarged para-aortic lymph nodes on palpation.  Normal-appearing small and large bowel.  Uterus approximately 6 cm and normal in appearance.  Normal bilateral tubes and ovaries.  No obvious adenopathy with very little lymphatic tissue in the pelvic basins.     Pathologic Stage   A. LYMPH NODE, LEFT PELVIC, EXCISION: - Negative for carcinoma (0/1)  B. LYMPH NODE, RIGHT PELVIC, EXCISION: - Negative for carcinoma (0/1)  C. 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 POSTERIOR MARGIN: - Negative for dysplasia or carcinoma  E. ANTERIOR MARGIN: - Negative for dysplasia or carcinoma  COMMENT:  Multiple levels were examined in the sections from the cervix to rule out microinvasion, and no foci of invasion were identified. Appropriately controlled p16 immunohistochemical stain is positive in the tumor. Slides from part C were reviewed with Dr. Saralyn Pilar who agrees with the above interpretation.      Interval History: Doing well.  Improving each day.  Denies any vaginal bleeding or discharge since several days after surgery.  Reports bowels are back to normal, alternating iron and Senokot now daily.  Continues to have some fatigue.  Has some abdominal soreness, improving daily.  Denies any fevers or chills.  Past Medical/Surgical History: Past Medical History:  Diagnosis Date   Asthma, mild    followed by pcp   Chronic kidney disease 2008   surgery for Kidney blockage   Depression    GAD (generalized anxiety disorder)    GERD (gastroesophageal reflux disease)    History of hiatal hernia    History of obstruction of ureter 2005   s/p  left ureteral stent (per pt not due to stone,  resolved)   Malignant neoplasm cervix ([place]) 06/07/2022   SCC   Pneumonia    Wears dentures    full upper   Wears glasses     Past Surgical History:  Procedure Laterality Date   CERVICAL CONIZATION W/BX N/A 06/30/2022   Procedure: CONIZATION CERVIX WITH BIOPSY; POST CONE ENDOCERVICAL CURRETTAGE;  Surgeon: [name], MD;  Location: [facility];  Service: Gynecology;  Laterality: N/A;   CESAREAN SECTION  1993   COLONOSCOPY WITH PROPOFOL   06/21/2022   COLONOSCOPY WITH PROPOFOL N/A 06/21/2022   Procedure: COLONOSCOPY WITH PROPOFOL;  Surgeon: [name], DO;  [facility];  Service: Endoscopy;  Laterality: N/A;  8:45 am ASA 2   CYSTOSCOPY WITH RETROGRADE PYELOGRAM, URETEROSCOPY AND STENT PLACEMENT Left 2005   for left upj obstructions (not due to stone)   ESOPHAGOGASTRODUODENOSCOPY (EGD) WITH PROPOFOL N/A 06/21/2022   Procedure: ESOPHAGOGASTRODUODENOSCOPY (EGD) WITH PROPOFOL;  Surgeon: [name], DO;  [facility];  Service: Endoscopy;  Laterality: N/A;   LAPAROSCOPIC TUBAL LIGATION Bilateral 1998   LYMPH NODE DISSECTION N/A 08/10/2022   Procedure: PELVIC LYMPH NODE DISSECTION;  Surgeon: [name], MD;  Location: [facility];  Service: Gynecology;  Laterality: N/A;   ORIF ANKLE FRACTURE Right 2010   has retained hardware   POLYPECTOMY  06/21/2022   Procedure: POLYPECTOMY;  Surgeon: [name], DO;  [facility];  Service: Endoscopy;;   RADICAL HYSTERECTOMY N/A 08/10/2022   Procedure: RADICAL HYSTERECTOMY, BILATERAL SALPINGO-OOPHORECTOMY;  Surgeon: [name], MD;  [facility];  Service: Gynecology;  Laterality: N/A;    Family History  Problem Relation Age of Onset   Hypothyroidism Mother    Hypertension Mother    Anxiety disorder Mother    Arthritis Mother    Hypertension Father    Stroke Father    Hypothyroidism Sister    ADD / ADHD Sister    Asthma Sister    ADD / ADHD  Sister    ADD / ADHD Sister    ADD / ADHD Brother    ADD / ADHD Brother    Colon cancer Brother    Depression Daughter    Depression Daughter    Colon polyps Neg Hx    Breast cancer Neg Hx    Ovarian cancer Neg Hx    Endometrial cancer Neg Hx    Pancreatic cancer Neg Hx    Prostate cancer Neg Hx     Social History   Socioeconomic History   Marital status: Divorced    Spouse name: Not on file   Number of children: 4   Years of education: Not on file   Highest  education level: 10th grade  Occupational History   Not on file  Tobacco Use   Smoking status: Never   Smokeless tobacco: Never  Vaping Use   Vaping Use: Never used  Substance and Sexual Activity   Alcohol use: Not Currently    Comment: seldom   Drug use: Never   Sexual activity: Yes    Partners: Male    Birth control/protection: Surgical  Other Topics Concern   Not on file  Social History Narrative   Lives alone   4 children-one in [place], [place], [place], and [place] in the [place] children 2      Enjoy: spending time with family, children visit often       Diets: eats all food groups    Caffeine: 1 cup coffee twice week   Water: 6-8 cups      Wears seat belt    Does not use phone while driving-handfree   Smoke detectors at home   No weapons in the house    Social Determinants of Health   Financial Resource Strain: [place]  (08/15/2022)   Overall Financial Resource Strain (CARDIA)    Difficulty of Paying Living Expenses: Not very hard  Food Insecurity: No Food Insecurity (08/11/2022)   Hunger Vital Sign    Worried About Running Out of Food in the Last Year: Never true    [place] in the Last Year: Never true  Transportation Needs: No Transportation Needs (08/15/2022)   PRAPARE - Hydrologist (Medical): No    Lack of Transportation (Non-Medical): No  Physical Activity: Inactive (04/25/2022)   Exercise Vital Sign    Days of Exercise per Week: 0 days    Minutes of Exercise per Session: 0 min  Stress: Stress Concern Present (04/25/2022)   [place]    Feeling of Stress : Rather much  Social Connections: Unknown (04/25/2022)   Social Connection and Isolation Panel [NHANES]    Frequency of Communication with Friends and Family: Once a week    Frequency of Social Gatherings with Friends and Family: Once a week    Attends Religious Services: Not on Stage manager or Organizations: No    Attends Archivist Meetings: Never    Marital Status: Divorced    Current Medications:  Current Outpatient Medications:    albuterol (VENTOLIN HFA) 108 (90 Base) MCG/ACT inhaler, Inhale 2 puffs into the lungs every 6 (six) hours as needed for wheezing or shortness of breath., Disp: , Rfl:    cholecalciferol (VITAMIN D3) 25 MCG (1000 UNIT) tablet, Take 1,000 Units by mouth daily., Disp: , Rfl:    cyanocobalamin (VITAMIN B12) 1000 MCG tablet, Take  1,000 mcg by mouth daily., Disp: , Rfl:    ferrous sulfate (FERROUSUL) 325 (65 FE) MG tablet, Take 1 tablet (325 mg total) by mouth every other day., Disp: 30 tablet, Rfl: 3   fluticasone-salmeterol (ADVAIR HFA) 230-21 MCG/ACT inhaler, Inhale 2 puffs into the lungs 2 (two) times daily., Disp: , Rfl:    losartan (COZAAR) 25 MG tablet, Take 1 tablet (25 mg total) by mouth daily., Disp: 30 tablet, Rfl: 3   Magnesium 250 MG TABS, Take 1 tablet by mouth daily., Disp: , Rfl:    omeprazole (PRILOSEC) 20 MG capsule, TAKE 1 CAPSULE BY MOUTH EVERY DAY (Patient taking differently: Take 20 mg by mouth daily.), Disp: 90 capsule, Rfl: 1   Probiotic Product (PROBIOTIC DAILY PO), Take 1 capsule by mouth daily., Disp: , Rfl:    senna-docusate (SENOKOT-S) 8.6-50 MG tablet, Take 2 tablets by mouth at bedtime. For AFTER surgery, do not take if having diarrhea, Disp: 30 tablet, Rfl: 0   sertraline (ZOLOFT) 25 MG tablet, TAKE 1 TABLET (25 MG TOTAL) BY MOUTH DAILY., Disp: 90 tablet, Rfl: 2   vitamin C (ASCORBIC ACID) 250 MG tablet, Take 250 mg by mouth daily., Disp: , Rfl:   Review of Systems: + Fatigue, abdominal pain, pelvic pain Denies appetite changes, fevers, chills, unexplained weight changes. Denies hearing loss, neck lumps or masses, mouth sores, ringing in ears or voice changes. Denies cough or wheezing.  Denies shortness of breath. Denies chest pain or palpitations. Denies leg swelling. Denies abdominal  distention, blood in stools, constipation, diarrhea, nausea, vomiting, or early satiety. Denies pain with intercourse, dysuria, frequency, hematuria or incontinence. Denies hot flashes, vaginal bleeding or vaginal discharge.   Denies joint pain, back pain or muscle pain/cramps. Denies itching, rash, or wounds. Denies dizziness, headaches, numbness or seizures. Denies swollen lymph nodes or glands, denies easy bruising or bleeding. Denies anxiety, depression, confusion, or decreased concentration.  Physical Exam: BP (!) 155/75 (Patient Position: Sitting)   Pulse 71   Temp 98.5 F (36.9 C) (Oral)   Resp 17   Ht 5' (1.524 m)   Wt 133 lb (60.3 kg)   SpO2 100%   BMI 25.97 kg/m  General: Alert, oriented, no acute distress. HEENT: Normocephalic, atraumatic, sclera anicteric. Chest: Unlabored breathing on room air. Abdomen: soft, nontender.  Normoactive bowel sounds.  No masses or hepatosplenomegaly appreciated.  Well-healed incision, remainder of Dermabond removed. Extremities: Grossly normal range of motion.  Warm, well perfused.  No edema bilaterally. Skin: No rashes or lesions noted. GU: Normal appearing external genitalia without erythema, excoriation, or lesions.  Speculum exam reveals cuff itself is intact, no bleeding or discharge noted.  Suture visible.  Bimanual exam reveals cuff is somewhat more firm than I would expect.  There is not true fluctuance.  No tenderness with palpation along the cuff.  Laboratory & Radiologic Studies: None new  Assessment & Plan: Shey Yott is a 56 y.o. 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 No LVI.    Doing well, meeting milestones. Expectations reviewed.  Given exam findings today, I discussed with the patient getting a pelvic CT scan to assure that there is no postoperative fluid collection.  I will call her with these results.    Discussed pathology. No residual invasive cancer in hysterectomy specimen, just CIS. Will clarify that all surgical margins are negative (vaginal margins are negative). Discussed no need  for additional treatment.   Per NCCN surveillance recommendations, given low risk disease, we will plan on visits every 6 months.  We discussed performing Pap and HPV testing every year.  We reviewed signs and symptoms that would be concerning for cancer recurrence, and I stressed the importance of calling if she develops any of these between visits.  20 minutes of total time was spent for this patient encounter, including preparation, face-to-face counseling with the patient and coordination of care, and documentation of the encounter.  Jeral Pinch, MD  Division of Gynecologic Oncology  Department of Obstetrics and Gynecology  [facility]

## 2022-09-02 NOTE — Patient Instructions (Addendum)
It was good to see you today.  You are healing well from surgery.  Because of how the top of the vagina feels, I'm going to order a get a pelvic CT scan to make sure that you do not have a fluid collection at the top of the vagina.  From a cancer follow-up standpoint, I will see you for follow-up in 6 months.  As always, if you develop any new and concerning symptoms before your next visit, please call to see me sooner.

## 2022-09-07 LAB — SURGICAL PATHOLOGY

## 2022-09-08 ENCOUNTER — Encounter: Payer: Self-pay | Admitting: Gastroenterology

## 2022-09-08 ENCOUNTER — Ambulatory Visit: Payer: Medicare HMO | Admitting: Gastroenterology

## 2022-09-08 VITALS — BP 154/88 | HR 71 | Temp 97.5°F | Ht 60.0 in | Wt 135.8 lb

## 2022-09-08 DIAGNOSIS — K641 Second degree hemorrhoids: Secondary | ICD-10-CM

## 2022-09-08 NOTE — Progress Notes (Signed)
      Katherine [name]  Katherine [name] is a 56 y.o. female presenting today for consideration of hemorrhoid banding. Last colonoscopy Sept 2023: non-bleeding internal hemorrhoids, one 12 mm polyp in cecum s/p removal. 3 year surveillance. Tubular adenoma. History of bleeding. Creams helped well. Moreso pressure. No prolapsing tissue.   The patient presents with symptomatic grade 2 hemorrhoids, unresponsive to maximal medical therapy, requesting rubber band ligation of her hemorrhoidal disease. All risks, benefits, and alternative forms of therapy were described and informed consent was obtained.   The decision was made to band the left lateral internal hemorrhoid, and the Viola was used to perform band ligation without complication. Digital anorectal examination was then performed to assure proper positioning of the band, and to adjust the banded tissue as required. The patient was discharged home without pain or other issues. Dietary and behavioral recommendations were given, along with follow-up instructions. The patient will return in several weeks for followup and possible additional banding as required.  No complications were encountered and the patient tolerated the procedure well.   Annitta Needs, PhD, ANP-BC [facility]

## 2022-09-08 NOTE — Patient Instructions (Signed)
  Please avoid straining.  You should limit your toilet time to 2-3 minutes at the most.   I recommend Benefiber 2 teaspoons each morning in the beverage of your choice!  Please call me with any concerns or issues!  I will see you in follow-up for additional banding in several weeks.  Have a wonderful birthday and Christmas!  I enjoyed seeing you again today! As you know, I value our relationship and want to provide genuine, compassionate, and quality care. I welcome your feedback. If you receive a survey regarding your visit,  I greatly appreciate you taking time to fill this out. See you next time!  Annitta Needs, PhD, ANP-BC [facility]

## 2022-09-12 ENCOUNTER — Ambulatory Visit (HOSPITAL_COMMUNITY): Payer: Medicare HMO

## 2022-09-15 ENCOUNTER — Telehealth: Payer: Self-pay | Admitting: *Deleted

## 2022-09-15 NOTE — Telephone Encounter (Signed)
LMOM for the patient to call the office back. Need to see if the patient wants to reschedule her missed CT scan.

## 2022-09-27 ENCOUNTER — Ambulatory Visit (HOSPITAL_COMMUNITY): Payer: Medicare HMO

## 2022-09-27 ENCOUNTER — Encounter (HOSPITAL_COMMUNITY): Payer: Self-pay

## 2022-09-28 ENCOUNTER — Telehealth: Payer: Self-pay | Admitting: *Deleted

## 2022-09-28 NOTE — Telephone Encounter (Signed)
Patient rescheduled her CT scan from 12/11 to 12/26. Patient canceled CT scan on 12/26.  LMOM for the patient to call the office back to reschedule scan

## 2022-09-28 NOTE — Telephone Encounter (Signed)
Spoke with the patient and she stated "My grand baby has RSV and my son and I have COVID. Please reschedule to next available time I can have the scan."  Per radiology patient must not come in for scan until 14 days after the positive test. Patient's insurance authorization will need to be extended. Message sent to authorization department.

## 2022-09-28 NOTE — Telephone Encounter (Signed)
Pt left a VM needing to reschedule her hem banding 12/28 with Vicente Males. Pt has covid

## 2022-09-29 ENCOUNTER — Encounter: Payer: Medicare HMO | Admitting: Gastroenterology

## 2022-09-29 NOTE — Telephone Encounter (Signed)
Rescheduled  patient's CT for 1/11, patient aware to cancel if she still has COVID symptoms

## 2022-10-10 ENCOUNTER — Ambulatory Visit: Payer: Medicare HMO | Admitting: Internal Medicine

## 2022-10-10 ENCOUNTER — Telehealth: Payer: Self-pay | Admitting: Internal Medicine

## 2022-10-10 ENCOUNTER — Other Ambulatory Visit: Payer: Self-pay | Admitting: Internal Medicine

## 2022-10-10 DIAGNOSIS — J4530 Mild persistent asthma, uncomplicated: Secondary | ICD-10-CM

## 2022-10-10 MED ORDER — ALBUTEROL SULFATE HFA 108 (90 BASE) MCG/ACT IN AERS: 2.0000 | INHALATION_SPRAY | Freq: Four times a day (QID) | RESPIRATORY_TRACT | 3 refills | Status: DC | PRN

## 2022-10-10 MED ORDER — FLUTICASONE-SALMETEROL 230-21 MCG/ACT IN AERO: 2.0000 | INHALATION_SPRAY | Freq: Two times a day (BID) | RESPIRATORY_TRACT | 5 refills | Status: DC

## 2022-10-10 NOTE — Telephone Encounter (Signed)
Patient called in requesting refill on    albuterol (VENTOLIN HFA) 108 (90 Base) MCG/ACT inhaler  fluticasone-salmeterol (ADVAIR HFA) 230-21 MCG/ACT inhaler   [facility], [place]

## 2022-10-13 ENCOUNTER — Encounter (HOSPITAL_COMMUNITY): Payer: Self-pay

## 2022-10-13 ENCOUNTER — Ambulatory Visit (HOSPITAL_COMMUNITY)
Admission: RE | Admit: 2022-10-13 | Discharge: 2022-10-13 | Disposition: A | Discharge status: Home / Self Care | Payer: Medicare HMO | Source: Ambulatory Visit | Attending: Gynecologic Oncology | Admitting: Gynecologic Oncology

## 2022-10-13 DIAGNOSIS — C539 Malignant neoplasm of cervix uteri, unspecified: Secondary | ICD-10-CM | POA: Diagnosis not present

## 2022-10-13 DIAGNOSIS — R1900 Intra-abdominal and pelvic swelling, mass and lump, unspecified site: Secondary | ICD-10-CM

## 2022-10-13 DIAGNOSIS — K5730 Diverticulosis of large intestine without perforation or abscess without bleeding: Secondary | ICD-10-CM | POA: Diagnosis not present

## 2022-10-13 DIAGNOSIS — Z90710 Acquired absence of both cervix and uterus: Secondary | ICD-10-CM | POA: Diagnosis not present

## 2022-10-13 DIAGNOSIS — Z8541 Personal history of malignant neoplasm of cervix uteri: Secondary | ICD-10-CM | POA: Diagnosis not present

## 2022-10-13 MED ORDER — IOHEXOL 300 MG/ML  SOLN
100.0000 mL | Freq: Once | INTRAMUSCULAR | Status: AC | PRN
  Administered 2022-10-13: 100 mL via INTRAVENOUS

## 2022-10-13 MED ORDER — IOHEXOL 9 MG/ML PO SOLN
ORAL | Status: AC
  Filled 2022-10-13: qty 1000

## 2022-10-13 MED ORDER — SODIUM CHLORIDE (PF) 0.9 % IJ SOLN
INTRAMUSCULAR | Status: AC
  Filled 2022-10-13: qty 50

## 2022-10-25 ENCOUNTER — Encounter: Payer: Self-pay | Admitting: Internal Medicine

## 2022-10-25 ENCOUNTER — Ambulatory Visit (INDEPENDENT_AMBULATORY_CARE_PROVIDER_SITE_OTHER): Payer: Medicare HMO | Admitting: Internal Medicine

## 2022-10-25 VITALS — BP 152/94 | HR 73 | Ht 60.0 in | Wt 140.0 lb

## 2022-10-25 DIAGNOSIS — E782 Mixed hyperlipidemia: Secondary | ICD-10-CM | POA: Diagnosis not present

## 2022-10-25 DIAGNOSIS — C530 Malignant neoplasm of endocervix: Secondary | ICD-10-CM | POA: Diagnosis not present

## 2022-10-25 DIAGNOSIS — F339 Major depressive disorder, recurrent, unspecified: Secondary | ICD-10-CM

## 2022-10-25 DIAGNOSIS — J4530 Mild persistent asthma, uncomplicated: Secondary | ICD-10-CM | POA: Diagnosis not present

## 2022-10-25 DIAGNOSIS — D508 Other iron deficiency anemias: Secondary | ICD-10-CM | POA: Diagnosis not present

## 2022-10-25 DIAGNOSIS — I10 Essential (primary) hypertension: Secondary | ICD-10-CM

## 2022-10-25 DIAGNOSIS — D509 Iron deficiency anemia, unspecified: Secondary | ICD-10-CM | POA: Diagnosis not present

## 2022-10-25 MED ORDER — LOSARTAN POTASSIUM 50 MG PO TABS: 50.0000 mg | ORAL_TABLET | Freq: Every day | ORAL | 1 refills | Status: DC

## 2022-10-25 MED ORDER — MISC. DEVICES MISC: 0 refills | Status: AC

## 2022-10-25 NOTE — Assessment & Plan Note (Signed)
BP Readings from Last 1 Encounters:  10/25/22 (!) 152/94   Uncontrolled with losartan 25 mg QD Increased dose of losartan 50 mg daily Check BMP Counseled for compliance with the medications Advised DASH diet and moderate exercise/walking, at least 150 mins/week

## 2022-10-25 NOTE — Assessment & Plan Note (Signed)
Well-controlled currently On Advair and albuterol PRN

## 2022-10-25 NOTE — Assessment & Plan Note (Signed)
Well-controlled with Zoloft °

## 2022-10-25 NOTE — Assessment & Plan Note (Signed)
Acute blood loss anemia from radical hysterectomy Oral iron supplement now Check CBC

## 2022-10-25 NOTE — Progress Notes (Signed)
Established Patient Office Visit  Subjective:  Patient ID: Katherine [name], female    DOB: [birth year]  Age: 57 y.o. MRN: [id number]  CC:  Chief Complaint  Patient presents with   Hypertension    Six week follow up on hypertension     HPI Katherine [name] is a 57 y.o. female with past medical history of HTN, GERD, asthma and MDD who presents for f/u of her chronic medical conditions.  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  She is followed by Dr. Berline Lopes for it.  She denies any vaginal discharge or bleeding currently.  She had drop in Hb, up to 8.0 from ~13 (baseline).  She has started taking iron supplement for it.  She denies any fatigue or exertional dyspnea currently.  HTN: Her BP was still elevated today.  She has started taking losartan 25 mg daily regularly.  She has history of elevated BP in previous office visits.  She currently denies any headache, dizziness, chest pain, dyspnea or palpitations.  GERD: She takes Omeprazole for it.  Denies any nausea, vomiting or epigastric pain now.  MDD: She has noticed improvement with Zoloft.  Denies any anhedonia or insomnia currently.  Asthma: She has albuterol as needed for dyspnea or wheezing.  She also has advair inhaler.  Denies any recent worsening of dyspnea or wheezing.  She complains of bilateral ringing sound in the ears, which is intermittent.  She reports history of exposure to loud noises at work in the past.  Currently denies any hearing difficulty, dizziness or ear discharge.  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   SCC    Pneumonia    Wears dentures    full upper   Wears glasses     Past Surgical History:  Procedure Laterality Date   CERVICAL CONIZATION W/BX N/A 06/30/2022   Procedure: CONIZATION CERVIX WITH BIOPSY; POST CONE ENDOCERVICAL CURRETTAGE;  Surgeon: [name], MD;  Location: [facility];  Service: Gynecology;  Laterality: N/A;   CESAREAN SECTION  1993   COLONOSCOPY WITH PROPOFOL  06/21/2022   COLONOSCOPY WITH PROPOFOL N/A 06/21/2022   Procedure: COLONOSCOPY WITH PROPOFOL;  Surgeon: [name], DO;  [facility];  Service: Endoscopy;  Laterality: N/A;  8:45 am ASA 2   CYSTOSCOPY WITH RETROGRADE PYELOGRAM, URETEROSCOPY AND STENT PLACEMENT Left 2005   for left upj obstructions (not due to stone)   ESOPHAGOGASTRODUODENOSCOPY (EGD) WITH PROPOFOL N/A 06/21/2022   Procedure: ESOPHAGOGASTRODUODENOSCOPY (EGD) WITH PROPOFOL;  Surgeon: [name], DO;  Location: [facility];  Service: Endoscopy;  Laterality: N/A;   LAPAROSCOPIC TUBAL LIGATION Bilateral 1998   LYMPH NODE DISSECTION N/A 08/10/2022   Procedure: PELVIC LYMPH NODE DISSECTION;  Surgeon: [name], MD;  Location: [facility];  Service: Gynecology;  Laterality: N/A;   ORIF ANKLE FRACTURE Right 2010   has retained hardware   POLYPECTOMY  06/21/2022   Procedure: POLYPECTOMY;  Surgeon: [name], DO;  Location: [facility];  Service: Endoscopy;;   RADICAL  HYSTERECTOMY N/A 08/10/2022   Procedure: RADICAL HYSTERECTOMY, BILATERAL SALPINGO-OOPHORECTOMY;  Surgeon: [name], MD;  Location: [facility];  Service: Gynecology;  Laterality: N/A;    Family History  Problem Relation Age of Onset   Hypothyroidism Mother    Hypertension Mother    Anxiety disorder Mother    Arthritis Mother    Hypertension Father    Stroke Father    Hypothyroidism Sister    ADD / ADHD Sister    Asthma Sister    ADD / ADHD Sister    ADD / ADHD Sister    ADD / ADHD Brother    ADD / ADHD Brother     Colon cancer Brother    Depression Daughter    Depression Daughter    Colon polyps Neg Hx    Breast cancer Neg Hx    Ovarian cancer Neg Hx    Endometrial cancer Neg Hx    Pancreatic cancer Neg Hx    Prostate cancer Neg Hx     Social History   Socioeconomic History   Marital status: Divorced    Spouse name: Not on file   Number of children: 4   Years of education: Not on file   Highest education level: 10th grade  Occupational History   Not on file  Tobacco Use   Smoking status: Never   Smokeless tobacco: Never  Vaping Use   Vaping Use: Never used  Substance and Sexual Activity   Alcohol use: Not Currently    Comment: seldom   Drug use: Never   Sexual activity: Yes    Partners: Male    Birth control/protection: Surgical  Other Topics Concern   Not on file  Social History Narrative   Lives alone   4 children-one in [place], [place], [place], and [place] in the [place] children 2      Enjoy: spending time with family, children visit often       Diets: eats all food groups    Caffeine: 1 cup coffee twice week   Water: 6-8 cups      Wears seat belt    Does not use phone while driving-handfree   Smoke detectors at home   No weapons in the house    Social Determinants of Health   Financial Resource Strain: [place]  (08/15/2022)   Overall Financial Resource Strain (CARDIA)    Difficulty of Paying Living Expenses: Not very hard  Food Insecurity: No Food Insecurity (08/11/2022)   Hunger Vital Sign    Worried About Running Out of Food in the Last Year: Never true    Ran Out of Food in the Last Year: Never true  Transportation Needs: No Transportation Needs (08/15/2022)   PRAPARE - Hydrologist (Medical): No    Lack of Transportation (Non-Medical): No  Physical Activity: Inactive (04/25/2022)   Exercise Vital Sign    Days of Exercise per Week: 0 days    Minutes of Exercise per Session: 0 min  Stress: Stress Concern Present  (04/25/2022)   [place]    Feeling of Stress : Rather much  Social Connections: Unknown (04/25/2022)   Social Connection and Isolation Panel [NHANES]    Frequency of Communication with Friends and Family: Once a week    Frequency of Social Gatherings with Friends and Family: Once a week    Attends Religious Services: Not on file    Active Member of  Clubs or Organizations: No    Attends Archivist Meetings: Never    Marital Status: Divorced  Human resources officer Violence: Not At Risk (08/11/2022)   Humiliation, Afraid, Rape, and Kick questionnaire    Fear of Current or Ex-Partner: No    Emotionally Abused: No    Physically Abused: No    Sexually Abused: No    Outpatient Medications Prior to Visit  Medication Sig Dispense Refill   albuterol (VENTOLIN HFA) 108 (90 Base) MCG/ACT inhaler Inhale 2 puffs into the lungs every 6 (six) hours as needed for wheezing or shortness of breath. 18 g 3   cholecalciferol (VITAMIN D3) 25 MCG (1000 UNIT) tablet Take 1,000 Units by mouth daily.     cyanocobalamin (VITAMIN B12) 1000 MCG tablet Take 1,000 mcg by mouth daily.     ferrous sulfate (FERROUSUL) 325 (65 FE) MG tablet Take 1 tablet (325 mg total) by mouth every other day. (Patient taking differently: Take 325 mg by mouth daily at 6 (six) AM.) 30 tablet 3   fluticasone-salmeterol (ADVAIR HFA) 230-21 MCG/ACT inhaler Inhale 2 puffs into the lungs 2 (two) times daily. 1 each 5   Magnesium 250 MG TABS Take 1 tablet by mouth daily.     omeprazole (PRILOSEC) 20 MG capsule TAKE 1 CAPSULE BY MOUTH EVERY DAY (Patient taking differently: Take 20 mg by mouth daily.) 90 capsule 1   Probiotic Product (PROBIOTIC DAILY PO) Take 1 capsule by mouth daily.     senna-docusate (SENOKOT-S) 8.6-50 MG tablet Take 2 tablets by mouth at bedtime. For AFTER surgery, do not take if having diarrhea (Patient taking differently: Take 2 tablets by mouth. Every  other day) 30 tablet 0   sertraline (ZOLOFT) 25 MG tablet TAKE 1 TABLET (25 MG TOTAL) BY MOUTH DAILY. 90 tablet 2   vitamin C (ASCORBIC ACID) 250 MG tablet Take 250 mg by mouth daily.     losartan (COZAAR) 25 MG tablet Take 1 tablet (25 mg total) by mouth daily. 30 tablet 3   No facility-administered medications prior to visit.    Allergies  Allergen Reactions   Amoxicillin Hives    ROS Review of Systems  Constitutional:  Negative for chills and fever.  HENT:  Positive for tinnitus. Negative for congestion, ear discharge, ear pain, hearing loss, sinus pressure, sinus pain and sore throat.   Eyes:  Negative for pain and discharge.  Respiratory:  Negative for cough and shortness of breath.   Cardiovascular:  Negative for chest pain and palpitations.  Gastrointestinal:  Negative for constipation, nausea and vomiting.  Endocrine: Negative for polydipsia and polyuria.  Genitourinary:  Negative for dysuria and hematuria.  Musculoskeletal:  Negative for neck pain and neck stiffness.  Skin:  Negative for rash.  Neurological:  Negative for dizziness and weakness.  Psychiatric/Behavioral:  Negative for agitation and behavioral problems.       Objective:    Physical Exam Vitals reviewed.  Constitutional:      General: She is not in acute distress.    Appearance: She is not diaphoretic.  HENT:     Head: Normocephalic and atraumatic.     Right Ear: Ear canal and external ear normal. There is no impacted cerumen.     Left Ear: Ear canal and external ear normal. There is no impacted cerumen.     Mouth/Throat:     Mouth: Mucous membranes are moist.  Eyes:     General: No scleral icterus.    Extraocular Movements: Extraocular movements intact.  Cardiovascular:     Rate and Rhythm: Normal rate and regular rhythm.     Pulses: Normal pulses.     Heart sounds: Normal heart sounds. No murmur heard. Pulmonary:     Breath sounds: Normal breath sounds. No wheezing or rales.   Musculoskeletal:     Cervical back: Neck supple. No tenderness.     Right lower leg: No edema.     Left lower leg: No edema.  Skin:    General: Skin is warm.     Findings: No rash.  Neurological:     General: No focal deficit present.     Mental Status: She is alert and oriented to person, place, and time.     Sensory: No sensory deficit.     Motor: No weakness.  Psychiatric:        Mood and Affect: Mood normal.        Behavior: Behavior normal.     BP (!) 152/94 (BP Location: Left Arm, Cuff Size: Normal)   Pulse 73   Ht 5' (1.524 m)   Wt 140 lb (63.5 kg)   SpO2 98%   BMI 27.34 kg/m  Wt Readings from Last 3 Encounters:  10/25/22 140 lb (63.5 kg)  09/08/22 135 lb 12.8 oz (61.6 kg)  09/02/22 133 lb (60.3 kg)    Lab Results  Component Value Date   TSH 2.350 04/27/2022   Lab Results  Component Value Date   WBC 9.2 08/12/2022   HGB 8.0 (L) 08/12/2022   HCT 23.9 (L) 08/12/2022   MCV 92.3 08/12/2022   PLT 181 08/12/2022   Lab Results  Component Value Date   NA 141 08/12/2022   K 4.4 08/12/2022   CO2 23 08/12/2022   GLUCOSE 96 08/12/2022   BUN 15 08/12/2022   CREATININE 0.84 08/12/2022   BILITOT 0.6 08/05/2022   ALKPHOS 122 08/05/2022   AST 28 08/05/2022   ALT 20 08/05/2022   PROT 7.8 08/05/2022   ALBUMIN 4.4 08/05/2022   CALCIUM 8.8 (L) 08/12/2022   ANIONGAP 5 08/12/2022   EGFR 97 04/27/2022   Lab Results  Component Value Date   CHOL 248 (H) 04/27/2022   Lab Results  Component Value Date   HDL 76 04/27/2022   Lab Results  Component Value Date   LDLCALC 147 (H) 04/27/2022   Lab Results  Component Value Date   TRIG 145 04/27/2022   Lab Results  Component Value Date   CHOLHDL 3.3 04/27/2022   Lab Results  Component Value Date   HGBA1C 5.5 04/27/2022      Assessment & Plan:   Problem List Items Addressed This Visit       Cardiovascular and Mediastinum   Essential hypertension - Primary    BP Readings from Last 1 Encounters:   10/25/22 (!) 152/94  Uncontrolled with losartan 25 mg QD Increased dose of losartan 50 mg daily Check BMP Counseled for compliance with the medications Advised DASH diet and moderate exercise/walking, at least 150 mins/week      Relevant Medications   losartan (COZAAR) 50 MG tablet   Misc. Devices MISC     Respiratory   Mild persistent asthma    Well-controlled currently On Advair and albuterol PRN        Genitourinary   Malignant neoplasm of cervix [facility])    S/p radical hysterectomy Followed by Dr. Berline Lopes - recent CT abdomen reviewed        Other   Depression, recurrent ([place])  Well-controlled with Zoloft      Mixed hyperlipidemia    Lipid profile reviewed Advised to follow DASH diet for now      Relevant Medications   losartan (COZAAR) 50 MG tablet   Iron deficiency anemia    Acute blood loss anemia from radical hysterectomy Oral iron supplement now Check CBC       Meds ordered this encounter  Medications   losartan (COZAAR) 50 MG tablet    Sig: Take 1 tablet (50 mg total) by mouth daily.    Dispense:  90 tablet    Refill:  1   Misc. Devices MISC    Sig: Blood pressure cuff/device - 1. ICD10: I10    Dispense:  1 each    Refill:  0    Follow-up: Return in about 2 months (around 12/24/2022) for HTN.    Lindell Spar, MD

## 2022-10-25 NOTE — Patient Instructions (Addendum)
Please start taking Losartan 50 mg instead of 25 mg.  Please continue to follow DASH diet and perform moderate exercise/walking at least 150 mins/week.

## 2022-10-25 NOTE — Assessment & Plan Note (Addendum)
S/p radical hysterectomy Followed by Dr. Berline Lopes - recent CT abdomen reviewed

## 2022-10-25 NOTE — Assessment & Plan Note (Signed)
Lipid profile reviewed Advised to follow DASH diet for now

## 2022-10-26 ENCOUNTER — Other Ambulatory Visit: Payer: Self-pay | Admitting: Internal Medicine

## 2022-10-26 DIAGNOSIS — E782 Mixed hyperlipidemia: Secondary | ICD-10-CM

## 2022-10-26 LAB — LIPID PANEL
Chol/HDL Ratio: 3.8 ratio (ref 0.0–4.4)
Cholesterol, Total: 310 mg/dL — ABNORMAL HIGH (ref 100–199)
HDL: 82 mg/dL (ref >39)
LDL Chol Calc (NIH): 201 mg/dL — ABNORMAL HIGH (ref 0–99)
Triglycerides: 149 mg/dL (ref 0–149)
VLDL Cholesterol Cal: 27 mg/dL (ref 5–40)

## 2022-10-26 LAB — BASIC METABOLIC PANEL
BUN/Creatinine Ratio: 12 (ref 9–23)
BUN: 8 mg/dL (ref 6–24)
CO2: 25 mmol/L (ref 20–29)
Calcium: 9.9 mg/dL (ref 8.7–10.2)
Chloride: 101 mmol/L (ref 96–106)
Creatinine, Ser: 0.69 mg/dL (ref 0.57–1.00)
Glucose: 91 mg/dL (ref 70–99)
Potassium: 5.1 mmol/L (ref 3.5–5.2)
Sodium: 138 mmol/L (ref 134–144)
eGFR: 101 mL/min/{1.73_m2} (ref >59)

## 2022-10-26 LAB — CBC WITH DIFFERENTIAL/PLATELET
Basophils Absolute: 0.1 10*3/uL (ref 0.0–0.2)
Basos: 1 %
EOS (ABSOLUTE): 0.4 10*3/uL (ref 0.0–0.4)
Eos: 6 %
Hematocrit: 38.6 % (ref 34.0–46.6)
Hemoglobin: 12.8 g/dL (ref 11.1–15.9)
Immature Grans (Abs): 0 10*3/uL (ref 0.0–0.1)
Immature Granulocytes: 0 %
Lymphocytes Absolute: 2.9 10*3/uL (ref 0.7–3.1)
Lymphs: 40 %
MCH: 28.2 pg (ref 26.6–33.0)
MCHC: 33.2 g/dL (ref 31.5–35.7)
MCV: 85 fL (ref 79–97)
Monocytes Absolute: 0.7 10*3/uL (ref 0.1–0.9)
Monocytes: 9 %
Neutrophils Absolute: 3.3 10*3/uL (ref 1.4–7.0)
Neutrophils: 44 %
Platelets: 295 10*3/uL (ref 150–450)
RBC: 4.54 x10E6/uL (ref 3.77–5.28)
RDW: 13.1 % (ref 11.7–15.4)
WBC: 7.3 10*3/uL (ref 3.4–10.8)

## 2022-10-26 MED ORDER — ROSUVASTATIN CALCIUM 10 MG PO TABS: 10.0000 mg | ORAL_TABLET | Freq: Every day | ORAL | 1 refills | Status: DC

## 2022-10-27 ENCOUNTER — Encounter: Payer: Medicare HMO | Admitting: Gastroenterology

## 2022-11-01 ENCOUNTER — Telehealth: Payer: Medicare HMO | Admitting: Physician Assistant

## 2022-11-01 DIAGNOSIS — R3989 Other symptoms and signs involving the genitourinary system: Secondary | ICD-10-CM

## 2022-11-01 MED ORDER — SULFAMETHOXAZOLE-TRIMETHOPRIM 800-160 MG PO TABS: 1.0000 | ORAL_TABLET | Freq: Two times a day (BID) | ORAL | 0 refills | Status: DC

## 2022-11-01 NOTE — Progress Notes (Signed)
Virtual Visit Consent   Katherine [name], you are scheduled for a virtual visit with a [facility] provider today. Just as with appointments in the office, your consent must be obtained to participate. Your consent will be active for this visit and any virtual visit you may have with one of our providers in the next 365 days. If you have a MyChart account, a copy of this consent can be sent to you electronically.  As this is a virtual visit, video technology does not allow for your provider to perform a traditional examination. This may limit your provider's ability to fully assess your condition. If your provider identifies any concerns that need to be evaluated in person or the need to arrange testing (such as labs, EKG, etc.), we will make arrangements to do so. Although advances in technology are sophisticated, we cannot ensure that it will always work on either your end or our end. If the connection with a video visit is poor, the visit may have to be switched to a telephone visit. With either a video or telephone visit, we are not always able to ensure that we have a secure connection.  By engaging in this virtual visit, you consent to the provision of healthcare and authorize for your insurance to be billed (if applicable) for the services provided during this visit. Depending on your insurance coverage, you may receive a charge related to this service.  I need to obtain your verbal consent now. Are you willing to proceed with your visit today? Rayyan Orsborn has provided verbal consent on 11/01/2022 for a virtual visit (video or telephone). Leeanne Rio, [place]  Date: 11/01/2022 10:20 AM  Virtual Visit via Video Note   I, Leeanne Rio, connected with  Jordy Hewins  ([id number], [birth year]) on 11/01/22 at 10:15 AM EST by a video-enabled telemedicine application and verified that I am speaking with the correct person using two identifiers.  Location: Patient: Virtual Visit Location Patient:  Home Provider: Virtual Visit Location Provider: Home Office   I discussed the limitations of evaluation and management by telemedicine and the availability of in person appointments. The patient expressed understanding and agreed to proceed.    History of Present Illness: Katherine [name] is a 57 y.o. who identifies as a female who was assigned female at birth, and is being seen today for possible UTI. Symptoms began yesterday early afternoon. Noted urgency, frequency and hesitancy. Overnight began noting some low back pain and nausea. Denies fever. Notes some anorexia. Denies any vomiting. Denies abdominal pain with this. Denies hematuria. Now with some mild dysuria. Denies vaginal symptoms or concern for STI.  HPI: HPI  Problems:  Patient Active Problem List   Diagnosis Date Noted   Prolapsed internal hemorrhoids, grade 2 09/08/2022   Tinnitus of both ears 08/29/2022   Mixed hyperlipidemia 08/29/2022   Iron deficiency anemia 08/29/2022   CIN III (cervical intraepithelial neoplasia grade III) with severe dysplasia    Malignant neoplasm of cervix ([place]) 06/07/2022   Hemorrhoids 05/24/2022   Encounter for general adult medical examination with abnormal findings 04/27/2022   Depression, recurrent ([place]) 04/27/2022   GERD (gastroesophageal reflux disease) 11/09/2021   Essential hypertension 04/21/2020   Dysuria 04/21/2020   Mild persistent asthma 04/21/2020    Allergies:  Allergies  Allergen Reactions   Amoxicillin Hives   Medications:  Current Outpatient Medications:    sulfamethoxazole-trimethoprim (BACTRIM DS) 800-160 MG tablet, Take 1 tablet by mouth 2 (two) times daily., Disp: 10 tablet, Rfl: 0  albuterol (VENTOLIN HFA) 108 (90 Base) MCG/ACT inhaler, Inhale 2 puffs into the lungs every 6 (six) hours as needed for wheezing or shortness of breath., Disp: 18 g, Rfl: 3   cholecalciferol (VITAMIN D3) 25 MCG (1000 UNIT) tablet, Take 1,000 Units by mouth daily., Disp: , Rfl:     cyanocobalamin (VITAMIN B12) 1000 MCG tablet, Take 1,000 mcg by mouth daily., Disp: , Rfl:    ferrous sulfate (FERROUSUL) 325 (65 FE) MG tablet, Take 1 tablet (325 mg total) by mouth every other day. (Patient taking differently: Take 325 mg by mouth daily at 6 (six) AM.), Disp: 30 tablet, Rfl: 3   fluticasone-salmeterol (ADVAIR HFA) 230-21 MCG/ACT inhaler, Inhale 2 puffs into the lungs 2 (two) times daily., Disp: 1 each, Rfl: 5   losartan (COZAAR) 50 MG tablet, Take 1 tablet (50 mg total) by mouth daily., Disp: 90 tablet, Rfl: 1   Magnesium 250 MG TABS, Take 1 tablet by mouth daily., Disp: , Rfl:    Misc. Devices MISC, Blood pressure cuff/device - 1. ICD10: I10, Disp: 1 each, Rfl: 0   omeprazole (PRILOSEC) 20 MG capsule, TAKE 1 CAPSULE BY MOUTH EVERY DAY (Patient taking differently: Take 20 mg by mouth daily.), Disp: 90 capsule, Rfl: 1   Probiotic Product (PROBIOTIC DAILY PO), Take 1 capsule by mouth daily., Disp: , Rfl:    rosuvastatin (CRESTOR) 10 MG tablet, Take 1 tablet (10 mg total) by mouth daily., Disp: 90 tablet, Rfl: 1   senna-docusate (SENOKOT-S) 8.6-50 MG tablet, Take 2 tablets by mouth at bedtime. For AFTER surgery, do not take if having diarrhea (Patient taking differently: Take 2 tablets by mouth. Every other day), Disp: 30 tablet, Rfl: 0   sertraline (ZOLOFT) 25 MG tablet, TAKE 1 TABLET (25 MG TOTAL) BY MOUTH DAILY., Disp: 90 tablet, Rfl: 2   vitamin C (ASCORBIC ACID) 250 MG tablet, Take 250 mg by mouth daily., Disp: , Rfl:   Observations/Objective: Patient is well-developed, well-nourished in no acute distress.  Resting comfortably at home.  Head is normocephalic, atraumatic.  No labored breathing. Speech is clear and coherent with logical content.  Patient is alert and oriented at baseline.   Assessment and Plan: 1. Suspected UTI - sulfamethoxazole-trimethoprim (BACTRIM DS) 800-160 MG tablet; Take 1 tablet by mouth 2 (two) times daily.  Dispense: 10 tablet; Refill:  0  Classic UTI symptoms with absence of alarm signs or symptoms. Prior history of UTI. Will treat empirically with Bactrim for suspected uncomplicated cystitis. Supportive measures and OTC medications reviewed. Strict in-person evaluation precautions discussed.    Follow Up Instructions: I discussed the assessment and treatment plan with the patient. The patient was provided an opportunity to ask questions and all were answered. The patient agreed with the plan and demonstrated an understanding of the instructions.  A copy of instructions were sent to the patient via MyChart unless otherwise noted below.   The patient was advised to call back or seek an in-person evaluation if the symptoms worsen or if the condition fails to improve as anticipated.  Time:  I spent 10 minutes with the patient via telehealth technology discussing the above problems/concerns.    Leeanne Rio, PA-C

## 2022-11-01 NOTE — Patient Instructions (Addendum)
Katherine [name], thank you for joining Leeanne Rio, PA-C for today's virtual visit.  While this provider is not your primary care provider (PCP), if your PCP is located in our provider database this encounter information will be shared with them immediately following your visit.   [facility] account gives you access to today's visit and all your visits, tests, and labs performed at [facility] " click here if you don't have a [facility] account or go to [email or web address]  Consent: (Patient) Katherine [name] provided verbal consent for this virtual visit at the beginning of the encounter.  Current Medications:  Current Outpatient Medications:    albuterol (VENTOLIN HFA) 108 (90 Base) MCG/ACT inhaler, Inhale 2 puffs into the lungs every 6 (six) hours as needed for wheezing or shortness of breath., Disp: 18 g, Rfl: 3   cholecalciferol (VITAMIN D3) 25 MCG (1000 UNIT) tablet, Take 1,000 Units by mouth daily., Disp: , Rfl:    cyanocobalamin (VITAMIN B12) 1000 MCG tablet, Take 1,000 mcg by mouth daily., Disp: , Rfl:    ferrous sulfate (FERROUSUL) 325 (65 FE) MG tablet, Take 1 tablet (325 mg total) by mouth every other day. (Patient taking differently: Take 325 mg by mouth daily at 6 (six) AM.), Disp: 30 tablet, Rfl: 3   fluticasone-salmeterol (ADVAIR HFA) 230-21 MCG/ACT inhaler, Inhale 2 puffs into the lungs 2 (two) times daily., Disp: 1 each, Rfl: 5   losartan (COZAAR) 50 MG tablet, Take 1 tablet (50 mg total) by mouth daily., Disp: 90 tablet, Rfl: 1   Magnesium 250 MG TABS, Take 1 tablet by mouth daily., Disp: , Rfl:    Misc. Devices MISC, Blood pressure cuff/device - 1. ICD10: I10, Disp: 1 each, Rfl: 0   omeprazole (PRILOSEC) 20 MG capsule, TAKE 1 CAPSULE BY MOUTH EVERY DAY (Patient taking differently: Take 20 mg by mouth daily.), Disp: 90 capsule, Rfl: 1   Probiotic Product (PROBIOTIC DAILY PO), Take 1 capsule by mouth daily., Disp: , Rfl:    rosuvastatin  (CRESTOR) 10 MG tablet, Take 1 tablet (10 mg total) by mouth daily., Disp: 90 tablet, Rfl: 1   senna-docusate (SENOKOT-S) 8.6-50 MG tablet, Take 2 tablets by mouth at bedtime. For AFTER surgery, do not take if having diarrhea (Patient taking differently: Take 2 tablets by mouth. Every other day), Disp: 30 tablet, Rfl: 0   sertraline (ZOLOFT) 25 MG tablet, TAKE 1 TABLET (25 MG TOTAL) BY MOUTH DAILY., Disp: 90 tablet, Rfl: 2   vitamin C (ASCORBIC ACID) 250 MG tablet, Take 250 mg by mouth daily., Disp: , Rfl:    Medications ordered in this encounter:  No orders of the defined types were placed in this encounter.    *If you need refills on other medications prior to your next appointment, please contact your pharmacy*  Follow-Up: Call back or seek an in-person evaluation if the symptoms worsen or if the condition fails to improve as anticipated.  [place] [phone]  Other Instructions Your symptoms are consistent with a bladder infection, also called acute cystitis. Please take your antibiotic (Bactrim) as directed until all pills are gone.  Stay very well hydrated.  Consider a daily probiotic (Align, Culturelle, or Activia) to help prevent stomach upset caused by the antibiotic.  Taking a probiotic daily may also help prevent recurrent UTIs.  Also consider taking AZO (Phenazopyridine) tablets to help decrease pain with urination.  If anything is not improving/resolving with treatment, or any new/worsening symptoms, you need  an in-person evaluation ASAP. Please do not delay care!  Urinary Tract Infection A urinary tract infection (UTI) can occur any place along the urinary tract. The tract includes the kidneys, ureters, bladder, and urethra. A type of germ called bacteria often causes a UTI. UTIs are often helped with antibiotic medicine.  HOME CARE  If given, take antibiotics as told by your doctor. Finish them even if you start to feel better. Drink enough fluids to keep  your pee (urine) clear or pale yellow. Avoid tea, drinks with caffeine, and bubbly (carbonated) drinks. Pee often. Avoid holding your pee in for a long time. Pee before and after having sex (intercourse). Wipe from front to back after you poop (bowel movement) if you are a woman. Use each tissue only once. GET HELP RIGHT AWAY IF:  You have back pain. You have lower belly (abdominal) pain. You have chills. You feel sick to your stomach (nauseous). You throw up (vomit). Your burning or discomfort with peeing does not go away. You have a fever. Your symptoms are not better in 3 days. MAKE SURE YOU:  Understand these instructions. Will watch your condition. Will get help right away if you are not doing well or get worse. Document Released: 03/07/2008 Document Revised: 06/13/2012 Document Reviewed: 04/19/2012 [facility] Patient Information 2015 [place], [place]. This information is not intended to replace advice given to you by your health care provider. Make sure you discuss any questions you have with your health care provider.    If you have been instructed to have an in-person evaluation today at a local Urgent Care facility, please use the link below. It will take you to a list of all of our available [facility], including address, phone number and hours of operation. Please do not delay care.  [facility]  If you or a family member do not have a primary care provider, use the link below to schedule a visit and establish care. When you choose a [facility] primary care physician or advanced practice provider, you gain a long-term partner in health. Find a Primary Care Provider  Learn more about [facility]'s in-office and virtual care options: [facility]

## 2022-11-10 ENCOUNTER — Encounter: Payer: Medicare HMO | Admitting: Gastroenterology

## 2022-11-24 ENCOUNTER — Encounter: Payer: Medicare HMO | Admitting: Gastroenterology

## 2022-12-27 ENCOUNTER — Ambulatory Visit: Payer: Medicare HMO | Admitting: Internal Medicine

## 2023-01-10 ENCOUNTER — Ambulatory Visit: Payer: Medicare HMO | Admitting: Internal Medicine

## 2023-01-19 ENCOUNTER — Encounter: Payer: Self-pay | Admitting: Gastroenterology

## 2023-01-19 ENCOUNTER — Ambulatory Visit (INDEPENDENT_AMBULATORY_CARE_PROVIDER_SITE_OTHER): Payer: Medicare HMO | Admitting: Gastroenterology

## 2023-01-19 VITALS — BP 164/81 | HR 70 | Temp 98.0°F | Ht 60.0 in | Wt 144.9 lb

## 2023-01-19 DIAGNOSIS — K641 Second degree hemorrhoids: Secondary | ICD-10-CM | POA: Diagnosis not present

## 2023-01-19 NOTE — Patient Instructions (Signed)
  Please avoid straining.  You should limit your toilet time to 2-3 minutes at the most.   I recommend Benefiber 2 teaspoons each morning in the beverage of your choice!  Please call me with any concerns or issues!  I will see you in follow-up for additional banding in several weeks.    I enjoyed seeing you again today! At our first visit, I mentioned how I value our relationship and want to provide genuine, compassionate, and quality care. You may receive a survey regarding your visit with me, and I welcome your feedback! Thanks so much for taking the time to complete this. I look forward to seeing you again.   Vaughan Garfinkle W. Danett Palazzo, PhD, ANP-BC [facility]      

## 2023-01-19 NOTE — Progress Notes (Signed)
    [facility] BANDING PROCEDURE NOTE  Katherine [name] is a 57 y.o. female presenting today for consideration of hemorrhoid banding. Last colonoscopy  Sept 2023: non-bleeding internal hemorrhoids, one 12 mm polyp in cecum s/p removal. 3 year surveillance. Tubular adenoma. She has had left lateral banding.    The patient presents with symptomatic grade 2 hemorrhoids, unresponsive to maximal medical therapy, requesting rubber band ligation of her hemorrhoidal disease. All risks, benefits, and alternative forms of therapy were described and informed consent was obtained.   The decision was made to band the right posterior internal hemorrhoid, and the [facility] O'Regan System was used to perform band ligation without complication. Digital anorectal examination was then performed to assure proper positioning of the band, and to adjust the banded tissue as required. The patient was discharged home without pain or other issues. Dietary and behavioral recommendations were given, along with follow-up instructions. The patient will return in several weeks for followup and possible additional banding as required.  No complications were encountered and the patient tolerated the procedure well.   Gelene Mink, PhD, ANP-BC [facility]

## 2023-01-24 ENCOUNTER — Ambulatory Visit: Payer: Medicare HMO | Admitting: Internal Medicine

## 2023-01-31 ENCOUNTER — Telehealth: Payer: Self-pay | Admitting: *Deleted

## 2023-01-31 NOTE — Telephone Encounter (Signed)
Patient called to reschedule her follow up appointment with Dr. Pricilla Holm on Friday, May 31st. She is flying out to [place] to visit with her grandaughter. Will not be back in town until June 9th.  Pt rescheduled with Warner Mccreedy, NP on Friday May 24th. At 1400 pt agrees to date and time. There was no available appts. With Dr. Pricilla Holm until the end of June.

## 2023-02-03 ENCOUNTER — Other Ambulatory Visit: Payer: Self-pay | Admitting: Internal Medicine

## 2023-02-03 ENCOUNTER — Other Ambulatory Visit: Payer: Self-pay | Admitting: Gastroenterology

## 2023-02-03 DIAGNOSIS — F339 Major depressive disorder, recurrent, unspecified: Secondary | ICD-10-CM

## 2023-02-03 DIAGNOSIS — J4530 Mild persistent asthma, uncomplicated: Secondary | ICD-10-CM

## 2023-02-16 ENCOUNTER — Encounter: Payer: Medicare HMO | Admitting: Gastroenterology

## 2023-02-22 ENCOUNTER — Encounter: Payer: Self-pay | Admitting: Gynecologic Oncology

## 2023-02-23 ENCOUNTER — Telehealth: Payer: Self-pay | Admitting: *Deleted

## 2023-02-23 NOTE — Telephone Encounter (Signed)
Moved appt from 5/24 to 6/14

## 2023-02-24 ENCOUNTER — Inpatient Hospital Stay: Payer: Medicare HMO | Admitting: Gynecologic Oncology

## 2023-02-24 DIAGNOSIS — C539 Malignant neoplasm of cervix uteri, unspecified: Secondary | ICD-10-CM

## 2023-03-03 ENCOUNTER — Ambulatory Visit: Payer: Medicare HMO | Admitting: Gynecologic Oncology

## 2023-03-08 ENCOUNTER — Telehealth: Payer: Self-pay | Admitting: *Deleted

## 2023-03-08 NOTE — Telephone Encounter (Signed)
Moved appt on 6/14 from 3 pm to 1:30 pm

## 2023-03-16 ENCOUNTER — Encounter: Payer: Self-pay | Admitting: Gynecologic Oncology

## 2023-03-17 ENCOUNTER — Ambulatory Visit: Payer: Medicare HMO | Admitting: Gynecologic Oncology

## 2023-03-17 ENCOUNTER — Other Ambulatory Visit: Payer: Self-pay

## 2023-03-17 ENCOUNTER — Inpatient Hospital Stay: Payer: Medicare HMO | Attending: Gynecologic Oncology | Admitting: Gynecologic Oncology

## 2023-03-17 ENCOUNTER — Encounter: Payer: Self-pay | Admitting: Gynecologic Oncology

## 2023-03-17 VITALS — BP 156/86 | HR 67 | Temp 97.7°F | Ht 60.63 in | Wt 147.4 lb

## 2023-03-17 DIAGNOSIS — Z90710 Acquired absence of both cervix and uterus: Secondary | ICD-10-CM | POA: Diagnosis not present

## 2023-03-17 DIAGNOSIS — Z90722 Acquired absence of ovaries, bilateral: Secondary | ICD-10-CM | POA: Insufficient documentation

## 2023-03-17 DIAGNOSIS — C539 Malignant neoplasm of cervix uteri, unspecified: Secondary | ICD-10-CM

## 2023-03-17 DIAGNOSIS — Z08 Encounter for follow-up examination after completed treatment for malignant neoplasm: Secondary | ICD-10-CM | POA: Diagnosis not present

## 2023-03-17 DIAGNOSIS — N951 Menopausal and female climacteric states: Secondary | ICD-10-CM | POA: Diagnosis not present

## 2023-03-17 DIAGNOSIS — Z8541 Personal history of malignant neoplasm of cervix uteri: Secondary | ICD-10-CM | POA: Insufficient documentation

## 2023-03-17 MED ORDER — ESTRADIOL 0.025 MG/24HR TD PTTW: 1.0000 | MEDICATED_PATCH | TRANSDERMAL | 12 refills | Status: DC

## 2023-03-17 NOTE — Patient Instructions (Addendum)
It was good to see you today.  I do not see or feel any evidence of cancer recurrence on your exam.  I will see you for follow-up in 6 months.  I am sending in a low-dose prescription for an estrogen patch.  Please let me know if this does not help improve your symptoms or if the patch is not sticking well.  As always, if you develop any new and concerning symptoms before your next visit, please call to see me sooner.

## 2023-03-17 NOTE — Progress Notes (Signed)
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   Malignant neoplasm of cervix (HCC)  06/07/2022 Initial Diagnosis   Malignant neoplasm of cervix (HCC)   06/17/2022 Initial Biopsy   Cervical biopsies, ECC  A. 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 CERVIX, 12:00, BIOPSY:  Microinvasive squamous cell carcinoma (0.25 mm depth) arising within  severe dysplasia to carcinoma in situ  Marked chronic and follicular cervicitis with squamous metaplasia   C. ENDOCERVICAL, CURRETAGE:  Numerous detached fragments of squamous cell carcinoma and severe  squamous dysplasia    06/29/2022 Imaging   PET: 1. Very low-grade activity in the vicinity of the cervix, maximum SUV 4.1, possibly reflecting the cervical malignancy. No findings of metastatic disease. 2. Small but hypermetabolic paratracheal, bilateral hilar/infrahilar, and subcarinal lymph nodes, at least one of which is partially calcified, raising some suspicion for granulomatous process such as sarcoidosis, although strictly speaking, lymphoma is not excluded. 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 3. Accentuated palatine tonsillar uptake bilaterally, probably physiologic but technically nonspecific. There is also a small but mildly hypermetabolic left level IIa lymph node with maximum SUV of 4.5. 4. Other imaging findings of potential clinical significance: Acute on chronic paranasal sinusitis. Mild cardiomegaly. Simple  appearing adnexal cysts warrant no further workup.   06/30/2022 Surgery   [facility], ECC  A. CERVIX, CONE:  - Invasive moderately differentiated squamous cell carcinoma arising in  background of extensive carcinoma in situ involving underlying  endocervical glands  - Invasive carcinoma is present in 6-9 and 9-12 o'clock quadrants while  12-3 o'clock and 3-6 o'clock quadrants show presence of carcinoma in  situ  - Carcinoma invades for a depth of at least 0.5 cm (6-9 o'clock  quadrant)  - The deep resection margin is widely positive for carcinoma (6-9  o'clock quadrant)  - All lateral mucosal margins are negative for invasive carcinoma  - Ecto- and endocervical margins in 12-3 o'clock, 3-6 o'clock and 6-9  o'clock quadrants are positive for carcinoma in situ; mucosal margins in  the 9-12 o'clock quadrants are negative   B. ENDOCERVIX, POST CONE, CURETTAGE:  - At least squamous cell carcinoma in situ  - Focal invasive carcinoma cannot be ruled out     Surgery   Type III radical hysterectomy with bilateral salpingoophorectomy and bilateral pelvic lymphadenectomy   On EUA, well-healed cervix, mildly firm suspected secondary to recent cold knife cone procedure.  On intra-abdominal entry, normal upper abdominal survey, no enlarged para-aortic lymph nodes on palpation.  Normal-appearing small and large bowel.  Uterus approximately 6 cm and normal in appearance.  Normal bilateral tubes and ovaries.  No obvious adenopathy with very little lymphatic tissue in the pelvic basins.     Pathologic Stage   A. LYMPH NODE, LEFT PELVIC, EXCISION: - Negative for carcinoma (0/1)  B. LYMPH NODE, RIGHT PELVIC, EXCISION: - Negative for carcinoma (0/1)  C. 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 POSTERIOR MARGIN: - Negative for dysplasia or carcinoma  E. ANTERIOR MARGIN: - Negative for dysplasia or carcinoma  COMMENT:  Multiple levels were examined in the sections from the cervix to rule out microinvasion, and no foci of invasion were identified. Appropriately controlled p16 immunohistochemical stain is positive in the tumor. Slides from part C were reviewed with Dr. Luisa Hart who agrees with the above interpretation.      Interval History: Patient reports overall doing very well.  She denies any vaginal bleeding or discharge.  She reports baseline bowel bladder function.  She notes having some issues including weight gain, tiredness, and difficulty sleeping.  She has also noticed some dry skin.  Past Medical/Surgical History: Past Medical History:  Diagnosis Date   Asthma, mild    followed by pcp   Chronic kidney disease 2008   surgery for Kidney blockage   Depression    GAD (generalized anxiety disorder)    GERD (gastroesophageal reflux disease)    History of hiatal hernia    History of obstruction of ureter 2005   s/p  left ureteral stent (per pt not due to stone,  resolved)   Malignant neoplasm cervix (HCC) 06/07/2022   SCC   Pneumonia    Wears dentures    full upper   Wears glasses     Past Surgical History:  Procedure Laterality Date   CERVICAL CONIZATION W/BX N/A 06/30/2022   Procedure: CONIZATION CERVIX WITH BIOPSY; POST CONE ENDOCERVICAL CURRETTAGE;  Surgeon: Carver Fila, MD;  Location: [facility];  Service: Gynecology;  Laterality: N/A;   CESAREAN SECTION  1993   COLONOSCOPY WITH PROPOFOL  06/21/2022   COLONOSCOPY WITH PROPOFOL N/A 06/21/2022    Procedure: COLONOSCOPY WITH PROPOFOL;  Surgeon: Lanelle Bal, DO;  [facility];  Service: Endoscopy;  Laterality: N/A;  8:45 am ASA 2   CYSTOSCOPY WITH RETROGRADE PYELOGRAM, URETEROSCOPY AND STENT PLACEMENT Left 2005   for left upj obstructions (not due to stone)   ESOPHAGOGASTRODUODENOSCOPY (EGD) WITH PROPOFOL N/A 06/21/2022   Procedure: ESOPHAGOGASTRODUODENOSCOPY (EGD) WITH PROPOFOL;  Surgeon: Lanelle Bal, DO;  [facility]: [facility];  Service: Endoscopy;  Laterality: N/A;   LAPAROSCOPIC TUBAL LIGATION Bilateral 1998   LYMPH NODE DISSECTION N/A 08/10/2022   Procedure: PELVIC LYMPH NODE DISSECTION;  Surgeon: Carver Fila, MD;  Location: [facility];  Service: Gynecology;  Laterality: N/A;   ORIF ANKLE FRACTURE Right 2010   has retained hardware   POLYPECTOMY  06/21/2022   Procedure: POLYPECTOMY;  Surgeon: Lanelle Bal, DO;  [facility]: [facility];  Service: Endoscopy;;   RADICAL HYSTERECTOMY N/A 08/10/2022   Procedure: RADICAL HYSTERECTOMY, BILATERAL SALPINGO-OOPHORECTOMY;  Surgeon: Carver Fila, MD;  Location: [facility];  Service: Gynecology;  Laterality: N/A;    Family History  Problem Relation Age of Onset   Hypothyroidism Mother    Hypertension Mother    Anxiety disorder Mother    Arthritis Mother    Hypertension Father    Stroke Father    Hypothyroidism Sister    ADD / ADHD Sister    Asthma Sister    ADD / ADHD Sister    ADD / ADHD Sister    ADD /  ADHD Brother    ADD / ADHD Brother    Colon cancer Brother    Depression Daughter    Depression Daughter    Colon polyps Neg Hx    Breast cancer Neg Hx    Ovarian cancer Neg Hx    Endometrial cancer Neg Hx    Pancreatic cancer Neg Hx    Prostate cancer Neg Hx     Social History   Socioeconomic History   Marital status: Divorced    Spouse name: Not on file   Number of children: 4   Years of education: Not on file   Highest education level: Associate degree: occupational, Scientist, product/process development, or  vocational program  Occupational History   Not on file  Tobacco Use   Smoking status: Never   Smokeless tobacco: Never  Vaping Use   Vaping Use: Never used  Substance and Sexual Activity   Alcohol use: Not Currently    Comment: seldom   Drug use: Never   Sexual activity: Yes    Partners: Male    Birth control/protection: Surgical  Other Topics Concern   Not on file  Social History Narrative   Lives alone   4 children-one in [place], [place], [place], and [place] in the [employer]    [place] children 2      Enjoy: spending time with family, children visit often       Diets: eats all food groups    Caffeine: 1 cup coffee twice week   Water: 6-8 cups      Wears seat belt    Does not use phone while driving-handfree   Smoke detectors at home   No weapons in the house    Social Determinants of Health   Financial Resource Strain: Medium Risk (01/19/2023)   Overall Financial Resource Strain (CARDIA)    Difficulty of Paying Living Expenses: Somewhat hard  Food Insecurity: Food Insecurity Present (01/19/2023)   Hunger Vital Sign    Worried About Running Out of Food in the Last Year: Sometimes true    Ran Out of Food in the Last Year: Never true  Transportation Needs: No Transportation Needs (01/19/2023)   PRAPARE - Administrator, Civil Service (Medical): No    Lack of Transportation (Non-Medical): No  Physical Activity: Unknown (01/19/2023)   Exercise Vital Sign    Days of Exercise per Week: Patient declined    Minutes of Exercise per Session: 0 min  Stress: Stress Concern Present (01/19/2023)   [employer] of Occupational Health - Occupational Stress Questionnaire    Feeling of Stress : To some extent  Social Connections: Unknown (01/19/2023)   Social Connection and Isolation Panel [NHANES]    Frequency of Communication with Friends and Family: Twice a week    Frequency of Social Gatherings with Friends and Family: Once a week    Attends Religious Services:  Patient declined    Database administrator or Organizations: No    Attends Engineer, structural: Never    Marital Status: Divorced    Current Medications:  Current Outpatient Medications:    albuterol (VENTOLIN HFA) 108 (90 Base) MCG/ACT inhaler, TAKE 2 PUFFS BY MOUTH EVERY 6 HOURS AS NEEDED FOR WHEEZE OR SHORTNESS OF BREATH, Disp: 8.5 each, Rfl: 3   cholecalciferol (VITAMIN D3) 25 MCG (1000 UNIT) tablet, Take 1,000 Units by mouth daily., Disp: , Rfl:    cyanocobalamin (VITAMIN B12) 1000 MCG tablet, Take 1,000 mcg by mouth daily., Disp: , Rfl:    [  START ON 03/20/2023] estradiol (VIVELLE-DOT) 0.025 MG/24HR, Place 1 patch onto the skin 2 (two) times a week., Disp: 8 patch, Rfl: 12   fluticasone-salmeterol (ADVAIR HFA) 230-21 MCG/ACT inhaler, Inhale 2 puffs into the lungs 2 (two) times daily., Disp: 1 each, Rfl: 5   losartan (COZAAR) 50 MG tablet, Take 1 tablet (50 mg total) by mouth daily., Disp: 90 tablet, Rfl: 1   Magnesium 250 MG TABS, Take 1 tablet by mouth daily., Disp: , Rfl:    Misc. Devices MISC, Blood pressure cuff/device - 1. ICD10: I10, Disp: 1 each, Rfl: 0   omeprazole (PRILOSEC) 20 MG capsule, TAKE 1 CAPSULE BY MOUTH EVERY DAY (Patient taking differently: Take 20 mg by mouth daily.), Disp: 90 capsule, Rfl: 1   Probiotic Product (PROBIOTIC DAILY PO), Take 1 capsule by mouth daily., Disp: , Rfl:    rosuvastatin (CRESTOR) 10 MG tablet, Take 1 tablet (10 mg total) by mouth daily., Disp: 90 tablet, Rfl: 1   sertraline (ZOLOFT) 25 MG tablet, TAKE 1 TABLET (25 MG TOTAL) BY MOUTH DAILY., Disp: 90 tablet, Rfl: 2   vitamin C (ASCORBIC ACID) 250 MG tablet, Take 250 mg by mouth daily., Disp: , Rfl:    ferrous sulfate (FERROUSUL) 325 (65 FE) MG tablet, Take 1 tablet (325 mg total) by mouth every other day. (Patient not taking: Reported on 03/16/2023), Disp: 30 tablet, Rfl: 3   ketoconazole (NIZORAL) 2 % cream, APPLY 1 APPLICATION TOPICALLY 2 (TWO) TIMES DAILY AS NEEDED FOR IRRITATION. TO  RECTUM FOR ITCHING (Patient not taking: Reported on 03/16/2023), Disp: 30 g, Rfl: 0   senna-docusate (SENOKOT-S) 8.6-50 MG tablet, Take 2 tablets by mouth at bedtime. For AFTER surgery, do not take if having diarrhea (Patient not taking: Reported on 03/16/2023), Disp: 30 tablet, Rfl: 0   sulfamethoxazole-trimethoprim (BACTRIM DS) 800-160 MG tablet, Take 1 tablet by mouth 2 (two) times daily. (Patient not taking: Reported on 03/16/2023), Disp: 10 tablet, Rfl: 0  Review of Systems: + Weight gain, frequency. Denies appetite changes, fevers, chills, fatigue. Denies hearing loss, neck lumps or masses, mouth sores, ringing in ears or voice changes. Denies cough or wheezing.  Denies shortness of breath. Denies chest pain or palpitations. Denies leg swelling. Denies abdominal distention, pain, blood in stools, constipation, diarrhea, nausea, vomiting, or early satiety. Denies pain with intercourse, dysuria, hematuria or incontinence. Denies hot flashes, pelvic pain, vaginal bleeding or vaginal discharge.   Denies joint pain, back pain or muscle pain/cramps. Denies itching, rash, or wounds. Denies dizziness, headaches, numbness or seizures. Denies swollen lymph nodes or glands, denies easy bruising or bleeding. Denies anxiety, depression, confusion, or decreased concentration.  Physical Exam: BP (!) 156/86 (BP Location: Left Arm, Patient Position: Sitting)   Pulse 67   Temp 97.7 F (36.5 C) (Oral)   Ht 5' 0.63" (1.54 m)   Wt 147 lb 6.4 oz (66.9 kg)   SpO2 100%   BMI 28.19 kg/m  General: Alert, oriented, no acute distress. HEENT: Normocephalic, atraumatic, sclera anicteric. Chest: Unlabored breathing on room air.  No wheezes or rhonchi. Cardiovascular: Regular rate and rhythm, no murmurs or rubs appreciated. Abdomen: soft, nontender.  Normoactive bowel sounds.  No masses or hepatosplenomegaly appreciated.  Well-healed incision, remainder of Dermabond removed. Extremities: Grossly normal range of  motion.  Warm, well perfused.  No edema bilaterally. Skin: No rashes or lesions noted. GU: Normal appearing external genitalia without erythema, excoriation, or lesions.  Speculum exam reveals cuff intact, no masses, bleeding, or discharge noted.  On  bimanual exam, the cuff is smooth, no masses or nodularity.  Rectovaginal exam confirms this.  Laboratory & Radiologic Studies: None new  Assessment & Plan: Katherine [name] is a 57 y.o. 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 Surgery 08/2022. No LVI.    Doing well, NED on exam.  Patient is having multiple menopausal symptoms.  I suspect she would benefit and may have symptom relief if not resolution with  low-dose estrogen replacement.  Patient interested in starting low-dose estrogen replacement.  Prescription for a patch sent to her pharmacy.  We discussed calling if this does not help with her symptoms or if the patch is not sticking well.   Per NCCN surveillance recommendations, given low risk disease, we will plan on visits every 6 months.  We discussed performing Pap and HPV testing every year.  We reviewed signs and symptoms that would be concerning for cancer recurrence, and I stressed the importance of calling if she develops any of these between visits.  22 minutes of total time was spent for this patient encounter, including preparation, face-to-face counseling with the patient and coordination of care, and documentation of the encounter.  Eugene Garnet, MD  Division of Gynecologic Oncology  Department of Obstetrics and Gynecology  [facility]

## 2023-03-29 ENCOUNTER — Ambulatory Visit: Payer: Medicare HMO | Admitting: Internal Medicine

## 2023-04-09 ENCOUNTER — Other Ambulatory Visit: Payer: Self-pay | Admitting: Gynecologic Oncology

## 2023-04-09 DIAGNOSIS — N951 Menopausal and female climacteric states: Secondary | ICD-10-CM

## 2023-04-12 ENCOUNTER — Other Ambulatory Visit: Payer: Self-pay | Admitting: Internal Medicine

## 2023-04-12 ENCOUNTER — Ambulatory Visit: Payer: Medicare HMO | Admitting: Internal Medicine

## 2023-04-12 DIAGNOSIS — J4530 Mild persistent asthma, uncomplicated: Secondary | ICD-10-CM

## 2023-04-30 ENCOUNTER — Other Ambulatory Visit: Payer: Self-pay | Admitting: Internal Medicine

## 2023-04-30 DIAGNOSIS — E782 Mixed hyperlipidemia: Secondary | ICD-10-CM

## 2023-04-30 DIAGNOSIS — I10 Essential (primary) hypertension: Secondary | ICD-10-CM

## 2023-05-26 ENCOUNTER — Telehealth: Payer: Medicare HMO | Admitting: Physician Assistant

## 2023-05-26 DIAGNOSIS — R3989 Other symptoms and signs involving the genitourinary system: Secondary | ICD-10-CM | POA: Diagnosis not present

## 2023-05-26 MED ORDER — SULFAMETHOXAZOLE-TRIMETHOPRIM 800-160 MG PO TABS: 1.0000 | ORAL_TABLET | Freq: Two times a day (BID) | ORAL | 0 refills | Status: DC

## 2023-05-26 NOTE — Progress Notes (Signed)
E-Visit for Urinary Problems  We are sorry that you are not feeling well.  Here is how we plan to help!  Based on what you shared with me it looks like you most likely have a simple urinary tract infection.  A UTI (Urinary Tract Infection) is a bacterial infection of the bladder.  Most cases of urinary tract infections are simple to treat but a key part of your care is to encourage you to drink plenty of fluids and watch your symptoms carefully.  I have prescribed Bactrim DS One tablet twice a day for 5 days.  Your symptoms should gradually improve. Call us if the burning in your urine worsens, you develop worsening fever, back pain or pelvic pain or if your symptoms do not resolve after completing the antibiotic.  Urinary tract infections can be prevented by drinking plenty of water to keep your body hydrated.  Also be sure when you wipe, wipe from front to back and don't hold it in!  If possible, empty your bladder every 4 hours.  HOME CARE Drink plenty of fluids Compete the full course of the antibiotics even if the symptoms resolve Remember, when you need to go.go. Holding in your urine can increase the likelihood of getting a UTI! GET HELP RIGHT AWAY IF: You cannot urinate You get a high fever Worsening back pain occurs You see blood in your urine You feel sick to your stomach or throw up You feel like you are going to pass out  MAKE SURE YOU  Understand these instructions. Will watch your condition. Will get help right away if you are not doing well or get worse.   Thank you for choosing an e-visit.  Your e-visit answers were reviewed by a board certified advanced clinical practitioner to complete your personal care plan. Depending upon the condition, your plan could have included both over the counter or prescription medications.  Please review your pharmacy choice. Make sure the pharmacy is open so you can pick up prescription now. If there is a problem, you may contact  your provider through MyChart messaging and have the prescription routed to another pharmacy.  Your safety is important to us. If you have drug allergies check your prescription carefully.   For the next 24 hours you can use MyChart to ask questions about today's visit, request a non-urgent call back, or ask for a work or school excuse. You will get an email in the next two days asking about your experience. I hope that your e-visit has been valuable and will speed your recovery.  I have spent 5 minutes in review of e-visit questionnaire, review and updating patient chart, medical decision making and response to patient.   Jennifer M Burnette, PA-C  

## 2023-06-12 ENCOUNTER — Ambulatory Visit (INDEPENDENT_AMBULATORY_CARE_PROVIDER_SITE_OTHER): Payer: Medicare HMO

## 2023-06-12 DIAGNOSIS — Z0000 Encounter for general adult medical examination without abnormal findings: Secondary | ICD-10-CM

## 2023-06-12 DIAGNOSIS — Z1231 Encounter for screening mammogram for malignant neoplasm of breast: Secondary | ICD-10-CM

## 2023-06-12 DIAGNOSIS — Z0100 Encounter for examination of eyes and vision without abnormal findings: Secondary | ICD-10-CM

## 2023-06-12 NOTE — Patient Instructions (Signed)

## 2023-06-12 NOTE — Progress Notes (Signed)
Subjective:   Katherine [name] is a 57 y.o. female who presents for Medicare Annual (Subsequent) preventive examination.  Visit Complete: Virtual  I connected with  Katherine [name] on 06/12/23 by a audio enabled telemedicine application and verified that I am speaking with the correct person using two identifiers.  Patient Location: Home  Provider Location: Office/Clinic  I discussed the limitations of evaluation and management by telemedicine. The patient expressed understanding and agreed to proceed.  Patient Medicare AWV questionnaire was completed by the patient on 06/12/2023; I have confirmed that all information answered by patient is correct and no changes since this date.  Vital Signs: Unable to obtain new vitals due to this being a telehealth visit.   Review of Systems     Katherine [name] , Thank you for taking time to come for your Medicare Wellness Visit. I appreciate your ongoing commitment to your health goals. Please review the following plan we discussed and let me know if I can assist you in the future.   These are the goals we discussed:  Goals      Patient Stated     Stay on top of my health this year!     Patient Stated     Be more active, lose weight, and be healthier.     Prevent falls        This is a list of the screening recommended for you and due dates:  Health Maintenance  Topic Date Due   COVID-19 Vaccine (1) Never done   DTaP/Tdap/Td vaccine (1 - Tdap) Never done   Zoster (Shingles) Vaccine (1 of 2) Never done   Flu Shot  Never done   Mammogram  05/09/2024   Medicare Annual Wellness Visit  06/11/2024   Pap Smear  05/23/2025   Colon Cancer Screening  06/21/2032   Hepatitis C Screening  Completed   HIV Screening  Completed   HPV Vaccine  Aged Out    Cardiac Risk Factors include: hypertension     Objective:    There were no vitals filed for this visit. There is no height or weight on file to calculate BMI.     06/12/2023    9:14 AM 03/16/2023     9:59 AM 02/22/2023    3:27 PM 08/10/2022    3:20 PM 08/10/2022    7:10 AM 08/02/2022    2:15 PM 06/30/2022    1:11 PM  Advanced Directives  Does Patient Have a Medical Advance Directive? Yes Yes Yes Yes Yes Yes Yes  Type of Estate agent of [place];Living will Healthcare Power of [place];Living will Healthcare Power of [place];Living will Healthcare Power of [employer] of [place];Living will  Living will  Does patient want to make changes to medical advance directive? No - Patient declined   No - Patient declined  No - Patient declined   Copy of Healthcare Power of Attorney in Chart? No - copy requested          Current Medications (verified) Outpatient Encounter Medications as of 06/12/2023  Medication Sig   ADVAIR HFA 230-21 MCG/ACT inhaler INHALE 2 PUFFS INTO THE LUNGS TWICE A DAY   albuterol (VENTOLIN HFA) 108 (90 Base) MCG/ACT inhaler TAKE 2 PUFFS BY MOUTH EVERY 6 HOURS AS NEEDED FOR WHEEZE OR SHORTNESS OF BREATH   cholecalciferol (VITAMIN D3) 25 MCG (1000 UNIT) tablet Take 1,000 Units by mouth daily.   cyanocobalamin (VITAMIN B12) 1000 MCG tablet Take 1,000 mcg by mouth daily.  estradiol (VIVELLE-DOT) 0.025 MG/24HR PLACE 1 PATCH ONTO THE SKIN 2 TIMES A WEEK.   losartan (COZAAR) 50 MG tablet TAKE 1 TABLET BY MOUTH EVERY DAY   Magnesium 250 MG TABS Take 1 tablet by mouth daily.   Misc. Devices MISC Blood pressure cuff/device - 1. ICD10: I10   omeprazole (PRILOSEC) 20 MG capsule TAKE 1 CAPSULE BY MOUTH EVERY DAY (Patient taking differently: Take 20 mg by mouth daily.)   Probiotic Product (PROBIOTIC DAILY PO) Take 1 capsule by mouth daily.   rosuvastatin (CRESTOR) 10 MG tablet TAKE 1 TABLET BY MOUTH EVERY DAY   sertraline (ZOLOFT) 25 MG tablet TAKE 1 TABLET (25 MG TOTAL) BY MOUTH DAILY.   vitamin C (ASCORBIC ACID) 250 MG tablet Take 250 mg by mouth daily.   ferrous sulfate (FERROUSUL) 325 (65 FE) MG tablet Take 1 tablet (325 mg total) by mouth every  other day. (Patient not taking: Reported on 03/16/2023)   ketoconazole (NIZORAL) 2 % cream APPLY 1 APPLICATION TOPICALLY 2 (TWO) TIMES DAILY AS NEEDED FOR IRRITATION. TO RECTUM FOR ITCHING (Patient not taking: Reported on 03/16/2023)   senna-docusate (SENOKOT-S) 8.6-50 MG tablet Take 2 tablets by mouth at bedtime. For AFTER surgery, do not take if having diarrhea (Patient not taking: Reported on 03/16/2023)   [DISCONTINUED] sulfamethoxazole-trimethoprim (BACTRIM DS) 800-160 MG tablet Take 1 tablet by mouth 2 (two) times daily.   No facility-administered encounter medications on file as of 06/12/2023.    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   [facility]   Pneumonia    Wears dentures    full upper   Wears glasses    Past Surgical History:  Procedure Laterality Date   ABDOMINAL HYSTERECTOMY  2023   CERVICAL CONIZATION W/BX N/A 06/30/2022   Procedure: CONIZATION CERVIX WITH BIOPSY; POST CONE ENDOCERVICAL CURRETTAGE;  Surgeon: Carver Fila, MD;  Location: [facility];  Service: Gynecology;  Laterality: N/A;   CESAREAN SECTION  1993   COLONOSCOPY WITH PROPOFOL  06/21/2022   COLONOSCOPY WITH PROPOFOL N/A 06/21/2022   Procedure: COLONOSCOPY WITH PROPOFOL;  Surgeon: Lanelle Bal, DO;  [facility];  Service: Endoscopy;  Laterality: N/A;  8:45 am ASA 2   CYSTOSCOPY WITH RETROGRADE PYELOGRAM, URETEROSCOPY AND STENT PLACEMENT Left 2005   for left upj obstructions (not due to stone)   ESOPHAGOGASTRODUODENOSCOPY (EGD) WITH PROPOFOL  N/A 06/21/2022   Procedure: ESOPHAGOGASTRODUODENOSCOPY (EGD) WITH PROPOFOL;  Surgeon: Lanelle Bal, DO;  Location[facility];  Service: Endoscopy;  Laterality: N/A;   FRACTURE SURGERY  2010   LAPAROSCOPIC TUBAL LIGATION Bilateral 1998   LYMPH NODE DISSECTION N/A 08/10/2022   Procedure: PELVIC LYMPH NODE DISSECTION;  Surgeon: Carver Fila, MD;  Location: [facility];  Service: Gynecology;  Laterality: N/A;   ORIF ANKLE FRACTURE Right 2010   has retained hardware   POLYPECTOMY  06/21/2022   Procedure: POLYPECTOMY;  Surgeon: Lanelle Bal, DO;  [facility];  Service: Endoscopy;;  RADICAL HYSTERECTOMY N/A 08/10/2022   Procedure: RADICAL HYSTERECTOMY, BILATERAL SALPINGO-OOPHORECTOMY;  Surgeon: Carver Fila, MD;  Location: [facility];  Service: Gynecology;  Laterality: N/A;   TUBAL LIGATION  1998   Family History  Problem Relation Age of Onset   Hypothyroidism Mother    Hypertension Mother    Anxiety disorder Mother    Arthritis Mother    Hypertension Father    Stroke Father    Hypothyroidism Sister    ADD / ADHD Sister    Asthma Sister    ADD / ADHD Sister    ADD / ADHD Sister    ADD / ADHD Brother    ADD / ADHD Brother    Colon cancer Brother    Depression Daughter    Depression Daughter    Colon polyps Neg Hx    Breast cancer Neg Hx    Ovarian cancer Neg Hx    Endometrial cancer Neg Hx    Pancreatic cancer Neg Hx    Prostate cancer Neg Hx    Social History   Socioeconomic History   Marital status: Divorced    Spouse name: Not on file   Number of children: 4   Years of education: Not on file   Highest education level: Associate degree: occupational, Scientist, product/process development, or vocational program  Occupational History   Not on file  Tobacco Use   Smoking status: Never   Smokeless tobacco: Never  Vaping Use   Vaping status: Never Used  Substance and Sexual Activity   Alcohol use: Not Currently    Comment: seldom   Drug use: Never   Sexual activity:  Not Currently    Partners: Male    Birth control/protection: Surgical  Other Topics Concern   Not on file  Social History Narrative   Lives alone   4 children-one in [place], [place], [place], and [place] in the [employer]    [place] children 2      Enjoy: spending time with family, children visit often       Diets: eats all food groups    Caffeine: 1 cup coffee twice week   Water: 6-8 cups      Wears seat belt    Does not use phone while driving-handfree   Smoke detectors at home   No weapons in the house    Social Determinants of Health   Financial Resource Strain: Medium Risk (06/06/2023)   Overall Financial Resource Strain (CARDIA)    Difficulty of Paying Living Expenses: Somewhat hard  Food Insecurity: Food Insecurity Present (06/12/2023)   Hunger Vital Sign    Worried About Running Out of Food in the Last Year: Sometimes true    Ran Out of Food in the Last Year: Never true  Transportation Needs: No Transportation Needs (06/06/2023)   PRAPARE - Administrator, Civil Service (Medical): No    Lack of Transportation (Non-Medical): No  Physical Activity: Insufficiently Active (06/06/2023)   Exercise Vital Sign    Days of Exercise per Week: 2 days    Minutes of Exercise per Session: 20 min  Stress: Stress Concern Present (06/06/2023)   [employer] of Occupational Health - Occupational Stress Questionnaire    Feeling of Stress : To some extent  Social Connections: Unknown (06/12/2023)   Social Connection and Isolation Panel [NHANES]    Frequency of Communication with Friends and Family: More than three times a week    Frequency of Social Gatherings with Friends and Family: Twice a week  Attends Religious Services: Patient declined    Active Member of Clubs or Organizations: No    Attends Banker Meetings: Never    Marital Status: Divorced    Tobacco Counseling Counseling given: Not Answered   Clinical Intake:     Pain : No/denies pain      BMI - recorded: 28.19 Nutritional Status: BMI 25 -29 Overweight Nutritional Risks: None Diabetes: No  How often do you need to have someone help you when you read instructions, pamphlets, or other written materials from your doctor or pharmacy?: 1 - Never What is the last grade level you completed in school?: some college  Interpreter Needed?: No      Activities of Daily Living    06/12/2023    9:09 AM 06/06/2023    9:13 AM  In your present state of health, do you have any difficulty performing the following activities:  Hearing? 0 0  Vision? 0 0  Difficulty concentrating or making decisions? 0 0  Walking or climbing stairs? 0 0  Dressing or bathing? 0 0  Doing errands, shopping? 0 0  Preparing Food and eating ? N N  Using the Toilet? N N  In the past six months, have you accidently leaked urine? Y Y  Do you have problems with loss of bowel control? N N  Managing your Medications? N N  Managing your Finances? N N  Housekeeping or managing your Housekeeping? N N    Patient Care Team: Anabel Halon, MD as PCP - General (Internal Medicine)  Indicate any recent Medical Services you may have received from other than [facility] providers in the past year (date may be approximate).     Assessment:   This is a routine wellness examination for Katherine [name].  Hearing/Vision screen No results found.   Goals Addressed             This Visit's Progress    Patient Stated       Be more active, lose weight, and be healthier.     COMPLETED: Weight (lb) < 200 lb (90.7 kg)       Pt would like to lose 5 more lbs by the end of the year       Depression Screen    06/12/2023    9:15 AM 06/12/2023    9:13 AM 10/25/2022    1:32 PM 08/29/2022   11:25 AM 05/23/2022    8:09 AM 04/27/2022   11:58 AM 04/27/2022   10:37 AM  PHQ 2/9 Scores  PHQ - 2 Score 0 0 0 0 0 4 0  PHQ- 9 Score      10     Fall Risk    06/12/2023    9:14 AM 06/06/2023    9:13 AM 10/25/2022    1:32 PM 08/29/2022   11:25  AM 05/23/2022    8:09 AM  Fall Risk   Falls in the past year? 0 0 0 0 0  Number falls in past yr: 0 0 0 0 0  Injury with Fall? 0 0 0 0 0  Risk for fall due to : No Fall Risks   No Fall Risks No Fall Risks  Follow up Falls evaluation completed   Falls evaluation completed Falls evaluation completed    MEDICARE RISK AT HOME: Medicare Risk at Home Any stairs in or around the home?: Yes If so, are there any without handrails?: No Home free of loose throw rugs in walkways, pet beds, electrical cords,  etc?: Yes Adequate lighting in your home to reduce risk of falls?: Yes Life alert?: No Use of a cane, walker or w/c?: No Grab bars in the bathroom?: Yes Shower chair or bench in shower?: No Elevated toilet seat or a handicapped toilet?: No  TIMED UP AND GO:  Was the test performed?  No    Cognitive Function:    04/21/2021    2:07 PM  MMSE - Mini Mental State Exam  Not completed: Unable to complete        06/12/2023    9:15 AM 05/09/2022    8:11 AM 04/21/2021    2:07 PM  6CIT Screen  What Year? 0 points 0 points 0 points  What month? 0 points 0 points 0 points  What time? 0 points 0 points 0 points  Count back from 20 0 points 0 points 0 points  Months in reverse 0 points 0 points 0 points  Repeat phrase 0 points 0 points 0 points  Total Score 0 points 0 points 0 points    Immunizations  There is no immunization history on file for this patient.  TDAP status: Due, Education has been provided regarding the importance of this vaccine. Advised may receive this vaccine at local pharmacy or Health Dept. Aware to provide a copy of the vaccination record if obtained from local pharmacy or Health Dept. Verbalized acceptance and understanding.  Flu Vaccine status: Due, Education has been provided regarding the importance of this vaccine. Advised may receive this vaccine at local pharmacy or Health Dept. Aware to provide a copy of the vaccination record if obtained from local pharmacy or  Health Dept. Verbalized acceptance and understanding.  Covid-19 vaccine status: Declined, Education has been provided regarding the importance of this vaccine but patient still declined. Advised may receive this vaccine at local pharmacy or Health Dept.or vaccine clinic. Aware to provide a copy of the vaccination record if obtained from local pharmacy or Health Dept. Verbalized acceptance and understanding.  Qualifies for Shingles Vaccine? Yes   Zostavax completed No   Shingrix Completed?: No.    Education has been provided regarding the importance of this vaccine. Patient has been advised to call insurance company to determine out of pocket expense if they have not yet received this vaccine. Advised may also receive vaccine at local pharmacy or Health Dept. Verbalized acceptance and understanding.  Screening Tests Health Maintenance  Topic Date Due   COVID-19 Vaccine (1) Never done   DTaP/Tdap/Td (1 - Tdap) Never done   Zoster Vaccines- Shingrix (1 of 2) Never done   INFLUENZA VACCINE  Never done   MAMMOGRAM  05/09/2024   Medicare Annual Wellness (AWV)  06/11/2024   PAP SMEAR-Modifier  05/23/2025   Colonoscopy  06/21/2032   Hepatitis C Screening  Completed   HIV Screening  Completed   HPV VACCINES  Aged Out    Health Maintenance  Health Maintenance Due  Topic Date Due   COVID-19 Vaccine (1) Never done   DTaP/Tdap/Td (1 - Tdap) Never done   Zoster Vaccines- Shingrix (1 of 2) Never done   INFLUENZA VACCINE  Never done    Colorectal cancer screening: Type of screening: Colonoscopy. Completed 06/21/2022. Repeat every 10 years  Mammogram status: Ordered 06/12/2023. Pt provided with contact info and advised to call to schedule appt.    Lung Cancer Screening: (Low Dose CT Chest recommended if Age 60-80 years, 20 pack-year currently smoking OR have quit w/in 15years.) does not qualify.   Lung Cancer  Screening Referral:   Additional Screening:  Hepatitis C Screening: does qualify;  Completed 04/27/2022  Vision Screening: Recommended annual ophthalmology exams for early detection of glaucoma and other disorders of the eye. Is the patient up to date with their annual eye exam?  No  Who is the provider or what is the name of the office in which the patient attends annual eye exams? N/A If pt is not established with a provider, would they like to be referred to a provider to establish care? Yes .   Dental Screening: Recommended annual dental exams for proper oral hygiene  Diabetic Foot Exam: N/A  Community Resource Referral / Chronic Care Management: CRR required this visit?  No   CCM required this visit?  No     Plan:     I have personally reviewed and noted the following in the patient's chart:   Medical and social history Use of alcohol, tobacco or illicit drugs  Current medications and supplements including opioid prescriptions. Patient is not currently taking opioid prescriptions. Functional ability and status Nutritional status Physical activity Advanced directives List of other physicians Hospitalizations, surgeries, and ER visits in previous 12 months Vitals Screenings to include cognitive, depression, and falls Referrals and appointments  In addition, I have reviewed and discussed with patient certain preventive protocols, quality metrics, and best practice recommendations. A written personalized care plan for preventive services as well as general preventive health recommendations were provided to patient.     Telford Nab, CMA   06/12/2023   After Visit Summary: (Mail) Due to this being a telephonic visit, the after visit summary with patients personalized plan was offered to patient via mail   Nurse Notes:  Katherine [name] , Thank you for taking time to come for your Medicare Wellness Visit. I appreciate your ongoing commitment to your health goals. Please review the following plan we discussed and let me know if I can assist you in the future.    These are the goals we discussed:  Goals      Patient Stated     Stay on top of my health this year!     Patient Stated     Be more active, lose weight, and be healthier.     Prevent falls        This is a list of the screening recommended for you and due dates:  Health Maintenance  Topic Date Due   COVID-19 Vaccine (1) Never done   DTaP/Tdap/Td vaccine (1 - Tdap) Never done   Zoster (Shingles) Vaccine (1 of 2) Never done   Flu Shot  Never done   Mammogram  05/09/2024   Medicare Annual Wellness Visit  06/11/2024   Pap Smear  05/23/2025   Colon Cancer Screening  06/21/2032   Hepatitis C Screening  Completed   HIV Screening  Completed   HPV Vaccine  Aged Out

## 2023-07-20 ENCOUNTER — Telehealth: Payer: Medicare HMO | Admitting: Family Medicine

## 2023-07-20 DIAGNOSIS — K047 Periapical abscess without sinus: Secondary | ICD-10-CM

## 2023-07-20 MED ORDER — CLINDAMYCIN HCL 300 MG PO CAPS
300.0000 mg | ORAL_CAPSULE | Freq: Three times a day (TID) | ORAL | 0 refills | Status: AC
Start: 2023-07-20 — End: 2023-07-27

## 2023-07-20 NOTE — Progress Notes (Signed)
Virtual Visit Consent   Katherine [name], you are scheduled for a virtual visit with a [facility] provider today. Just as with appointments in the office, your consent must be obtained to participate. Your consent will be active for this visit and any virtual visit you may have with one of our providers in the next 365 days. If you have a MyChart account, a copy of this consent can be sent to you electronically.  As this is a virtual visit, video technology does not allow for your provider to perform a traditional examination. This may limit your provider's ability to fully assess your condition. If your provider identifies any concerns that need to be evaluated in person or the need to arrange testing (such as labs, EKG, etc.), we will make arrangements to do so. Although advances in technology are sophisticated, we cannot ensure that it will always work on either your end or our end. If the connection with a video visit is poor, the visit may have to be switched to a telephone visit. With either a video or telephone visit, we are not always able to ensure that we have a secure connection.  By engaging in this virtual visit, you consent to the provision of healthcare and authorize for your insurance to be billed (if applicable) for the services provided during this visit. Depending on your insurance coverage, you may receive a charge related to this service.  I need to obtain your verbal consent now. Are you willing to proceed with your visit today? Katherine [name] has provided verbal consent on 07/20/2023 for a virtual visit (video or telephone). Freddy Finner, NP  Date: 07/20/2023 3:47 PM  Virtual Visit via Video Note   I, Freddy Finner, connected with  Katherine [name]  ([id number], [birth year]) on 07/20/23 at  3:45 PM EDT by a video-enabled telemedicine application and verified that I am speaking with the correct person using two identifiers.  Location: Patient: Virtual Visit Location Patient:  Home Provider: Virtual Visit Location Provider: Home Office   I discussed the limitations of evaluation and management by telemedicine and the availability of in person appointments. The patient expressed understanding and agreed to proceed.    History of Present Illness: Katherine [name] is a 57 y.o. who identifies as a female who was assigned female at birth, and is being seen today for pain in tooth  Onset was 2 days ago with right lower jaw-tooth (fillings came out)  Associated symptoms are headache, jaw pain, mild swelling, chewing pain- reduced appetite  Modifying factors are  motrin and tylenol without much relief, and oral gel Denies chest pain, shortness of breath, fevers, chills   Problems:  Patient Active Problem List   Diagnosis Date Noted   Prolapsed internal hemorrhoids, grade 2 09/08/2022   Tinnitus of both ears 08/29/2022   Mixed hyperlipidemia 08/29/2022   Iron deficiency anemia 08/29/2022   CIN III (cervical intraepithelial neoplasia grade III) with severe dysplasia    Malignant neoplasm of cervix (HCC) 06/07/2022   Hemorrhoids 05/24/2022   Encounter for general adult medical examination with abnormal findings 04/27/2022   Depression, recurrent (HCC) 04/27/2022   GERD (gastroesophageal reflux disease) 11/09/2021   Essential hypertension 04/21/2020   Dysuria 04/21/2020   Mild persistent asthma 04/21/2020    Allergies:  Allergies  Allergen Reactions   Amoxicillin Hives   Medications:  Current Outpatient Medications:    ADVAIR HFA 230-21 MCG/ACT inhaler, INHALE 2 PUFFS INTO THE LUNGS TWICE A DAY, Disp: 12 each, Rfl:  5   albuterol (VENTOLIN HFA) 108 (90 Base) MCG/ACT inhaler, TAKE 2 PUFFS BY MOUTH EVERY 6 HOURS AS NEEDED FOR WHEEZE OR SHORTNESS OF BREATH, Disp: 8.5 each, Rfl: 3   cholecalciferol (VITAMIN D3) 25 MCG (1000 UNIT) tablet, Take 1,000 Units by mouth daily., Disp: , Rfl:    cyanocobalamin (VITAMIN B12) 1000 MCG tablet, Take 1,000 mcg by mouth daily., Disp:  , Rfl:    estradiol (VIVELLE-DOT) 0.025 MG/24HR, PLACE 1 PATCH ONTO THE SKIN 2 TIMES A WEEK., Disp: 24 patch, Rfl: 5   ferrous sulfate (FERROUSUL) 325 (65 FE) MG tablet, Take 1 tablet (325 mg total) by mouth every other day. (Patient not taking: Reported on 03/16/2023), Disp: 30 tablet, Rfl: 3   ketoconazole (NIZORAL) 2 % cream, APPLY 1 APPLICATION TOPICALLY 2 (TWO) TIMES DAILY AS NEEDED FOR IRRITATION. TO RECTUM FOR ITCHING (Patient not taking: Reported on 03/16/2023), Disp: 30 g, Rfl: 0   losartan (COZAAR) 50 MG tablet, TAKE 1 TABLET BY MOUTH EVERY DAY, Disp: 90 tablet, Rfl: 1   Magnesium 250 MG TABS, Take 1 tablet by mouth daily., Disp: , Rfl:    Misc. Devices MISC, Blood pressure cuff/device - 1. ICD10: I10, Disp: 1 each, Rfl: 0   omeprazole (PRILOSEC) 20 MG capsule, TAKE 1 CAPSULE BY MOUTH EVERY DAY (Patient taking differently: Take 20 mg by mouth daily.), Disp: 90 capsule, Rfl: 1   Probiotic Product (PROBIOTIC DAILY PO), Take 1 capsule by mouth daily., Disp: , Rfl:    rosuvastatin (CRESTOR) 10 MG tablet, TAKE 1 TABLET BY MOUTH EVERY DAY, Disp: 90 tablet, Rfl: 1   senna-docusate (SENOKOT-S) 8.6-50 MG tablet, Take 2 tablets by mouth at bedtime. For AFTER surgery, do not take if having diarrhea (Patient not taking: Reported on 03/16/2023), Disp: 30 tablet, Rfl: 0   sertraline (ZOLOFT) 25 MG tablet, TAKE 1 TABLET (25 MG TOTAL) BY MOUTH DAILY., Disp: 90 tablet, Rfl: 2   vitamin C (ASCORBIC ACID) 250 MG tablet, Take 250 mg by mouth daily., Disp: , Rfl:   Observations/Objective: Patient is well-developed, well-nourished in no acute distress.  Resting comfortably at home.  Head is normocephalic, atraumatic.  No labored breathing.  Speech is clear and coherent with logical content.  Patient is alert and oriented at baseline.    Assessment and Plan:  1. Dental infection  - clindamycin (CLEOCIN) 300 MG capsule; Take 1 capsule (300 mg total) by mouth 3 (three) times daily for 7 days.  Dispense: 21  capsule; Refill: 0  Suspected infection with broken tooth - Cleocin ordered (Amoxicillin ALL) and ibuprofen prescribed  - Can use ice on outside jaw/cheek for swelling - Can also take tylenol for pain with other medications - Discussed DenTemp putty that can be used to cover a broken tooth - Schedule a follow with a dentist as soon as possible (Can contact [facility] or [facility] [phone]] associated with [facility] health department if underinsured or uninsured) - Seek in person evaluation if symptoms fail to improve or if they worsen  Reviewed side effects, risks and benefits of medication.    Patient acknowledged agreement and understanding of the plan.   Past Medical, Surgical, Social History, Allergies, and Medications have been Reviewed.   Follow Up Instructions: I discussed the assessment and treatment plan with the patient. The patient was provided an opportunity to ask questions and all were answered. The patient agreed with the plan and demonstrated an understanding of the instructions.  A copy of instructions  were sent to the patient via MyChart unless otherwise noted below.    The patient was advised to call back or seek an in-person evaluation if the symptoms worsen or if the condition fails to improve as anticipated.    Freddy Finner, NP

## 2023-07-20 NOTE — Patient Instructions (Signed)
Katherine [name], thank you for joining Freddy Finner, NP for today's virtual visit.  While this provider is not your primary care provider (PCP), if your PCP is located in our provider database this encounter information will be shared with them immediately following your visit.   A [facility] MyChart account gives you access to today's visit and all your visits, tests, and labs performed at [facility] " click here if you don't have a  MyChart account or go to [email or web address]  Consent: (Patient) Katherine [name] provided verbal consent for this virtual visit at the beginning of the encounter.  Current Medications:  Current Outpatient Medications:    clindamycin (CLEOCIN) 300 MG capsule, Take 1 capsule (300 mg total) by mouth 3 (three) times daily for 7 days., Disp: 21 capsule, Rfl: 0   ADVAIR HFA 230-21 MCG/ACT inhaler, INHALE 2 PUFFS INTO THE LUNGS TWICE A DAY, Disp: 12 each, Rfl: 5   albuterol (VENTOLIN HFA) 108 (90 Base) MCG/ACT inhaler, TAKE 2 PUFFS BY MOUTH EVERY 6 HOURS AS NEEDED FOR WHEEZE OR SHORTNESS OF BREATH, Disp: 8.5 each, Rfl: 3   cholecalciferol (VITAMIN D3) 25 MCG (1000 UNIT) tablet, Take 1,000 Units by mouth daily., Disp: , Rfl:    cyanocobalamin (VITAMIN B12) 1000 MCG tablet, Take 1,000 mcg by mouth daily., Disp: , Rfl:    estradiol (VIVELLE-DOT) 0.025 MG/24HR, PLACE 1 PATCH ONTO THE SKIN 2 TIMES A WEEK., Disp: 24 patch, Rfl: 5   ferrous sulfate (FERROUSUL) 325 (65 FE) MG tablet, Take 1 tablet (325 mg total) by mouth every other day. (Patient not taking: Reported on 03/16/2023), Disp: 30 tablet, Rfl: 3   ketoconazole (NIZORAL) 2 % cream, APPLY 1 APPLICATION TOPICALLY 2 (TWO) TIMES DAILY AS NEEDED FOR IRRITATION. TO RECTUM FOR ITCHING (Patient not taking: Reported on 03/16/2023), Disp: 30 g, Rfl: 0   losartan (COZAAR) 50 MG tablet, TAKE 1 TABLET BY MOUTH EVERY DAY, Disp: 90 tablet, Rfl: 1   Magnesium 250 MG TABS, Take 1 tablet by mouth daily., Disp: ,  Rfl:    Misc. Devices MISC, Blood pressure cuff/device - 1. ICD10: I10, Disp: 1 each, Rfl: 0   omeprazole (PRILOSEC) 20 MG capsule, TAKE 1 CAPSULE BY MOUTH EVERY DAY (Patient taking differently: Take 20 mg by mouth daily.), Disp: 90 capsule, Rfl: 1   Probiotic Product (PROBIOTIC DAILY PO), Take 1 capsule by mouth daily., Disp: , Rfl:    rosuvastatin (CRESTOR) 10 MG tablet, TAKE 1 TABLET BY MOUTH EVERY DAY, Disp: 90 tablet, Rfl: 1   senna-docusate (SENOKOT-S) 8.6-50 MG tablet, Take 2 tablets by mouth at bedtime. For AFTER surgery, do not take if having diarrhea (Patient not taking: Reported on 03/16/2023), Disp: 30 tablet, Rfl: 0   sertraline (ZOLOFT) 25 MG tablet, TAKE 1 TABLET (25 MG TOTAL) BY MOUTH DAILY., Disp: 90 tablet, Rfl: 2   vitamin C (ASCORBIC ACID) 250 MG tablet, Take 250 mg by mouth daily., Disp: , Rfl:    Medications ordered in this encounter:  Meds ordered this encounter  Medications   clindamycin (CLEOCIN) 300 MG capsule    Sig: Take 1 capsule (300 mg total) by mouth 3 (three) times daily for 7 days.    Dispense:  21 capsule    Refill:  0    Order Specific Question:   Supervising Provider    Answer:   Merrilee Jansky [id number]     *If you need refills on other medications prior to your next appointment, please contact your pharmacy*  Follow-Up: Call back or seek an in-person evaluation if the symptoms worsen or if the condition fails to improve as anticipated.  [facility] [phone]  Other Instructions Suspected infection with broken tooth - Take meds as ordered - Can use ice on outside jaw/cheek for swelling - Can also take tylenol for pain with other medications - Discussed DenTemp putty that can be used to cover a broken tooth - Schedule a follow with a dentist as soon as possible (Can contact [facility] or [facility] [phone]] associated with [facility] health department if underinsured or uninsured) - Seek  in person evaluation if symptoms fail to improve or if they worsen    If you have been instructed to have an in-person evaluation today at a local Urgent Care facility, please use the link below. It will take you to a list of all of our available [facility], including address, phone number and hours of operation. Please do not delay care.  [facility]  If you or a family member do not have a primary care provider, use the link below to schedule a visit and establish care. When you choose a [facility] primary care physician or advanced practice provider, you gain a long-term partner in health. Find a Primary Care Provider  Learn more about [facility]'s in-office and virtual care options:  - Get Care Now

## 2023-09-08 ENCOUNTER — Encounter: Payer: Self-pay | Admitting: Gynecologic Oncology

## 2023-09-08 ENCOUNTER — Inpatient Hospital Stay: Payer: Medicare HMO | Attending: Gynecologic Oncology | Admitting: Gynecologic Oncology

## 2023-09-08 ENCOUNTER — Other Ambulatory Visit (HOSPITAL_COMMUNITY)
Admission: RE | Admit: 2023-09-08 | Discharge: 2023-09-08 | Disposition: A | Discharge status: Home / Self Care | Payer: Medicare HMO | Source: Ambulatory Visit | Attending: Gynecologic Oncology | Admitting: Gynecologic Oncology

## 2023-09-08 VITALS — BP 156/88 | HR 66 | Temp 98.6°F | Resp 20 | Wt 155.2 lb

## 2023-09-08 DIAGNOSIS — Z79890 Hormone replacement therapy (postmenopausal): Secondary | ICD-10-CM | POA: Insufficient documentation

## 2023-09-08 DIAGNOSIS — Z01419 Encounter for gynecological examination (general) (routine) without abnormal findings: Secondary | ICD-10-CM | POA: Diagnosis not present

## 2023-09-08 DIAGNOSIS — Z1151 Encounter for screening for human papillomavirus (HPV): Secondary | ICD-10-CM | POA: Insufficient documentation

## 2023-09-08 DIAGNOSIS — Z9079 Acquired absence of other genital organ(s): Secondary | ICD-10-CM | POA: Diagnosis not present

## 2023-09-08 DIAGNOSIS — Z90710 Acquired absence of both cervix and uterus: Secondary | ICD-10-CM | POA: Diagnosis not present

## 2023-09-08 DIAGNOSIS — E8940 Asymptomatic postprocedural ovarian failure: Secondary | ICD-10-CM | POA: Insufficient documentation

## 2023-09-08 DIAGNOSIS — Z08 Encounter for follow-up examination after completed treatment for malignant neoplasm: Secondary | ICD-10-CM | POA: Diagnosis present

## 2023-09-08 DIAGNOSIS — Z8541 Personal history of malignant neoplasm of cervix uteri: Secondary | ICD-10-CM

## 2023-09-08 DIAGNOSIS — C539 Malignant neoplasm of cervix uteri, unspecified: Secondary | ICD-10-CM

## 2023-09-08 DIAGNOSIS — Z90722 Acquired absence of ovaries, bilateral: Secondary | ICD-10-CM | POA: Insufficient documentation

## 2023-09-08 NOTE — Patient Instructions (Signed)
It was good to see you today.  I do not see or feel any evidence of cancer recurrence on your exam.  I will see you for follow-up in 6 months.  As always, if you develop any new and concerning symptoms before your next visit, please call to see me sooner.   

## 2023-09-08 NOTE — Progress Notes (Signed)
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   Malignant neoplasm of cervix (HCC)  06/07/2022 Initial Diagnosis   Malignant neoplasm of cervix (HCC)   06/17/2022 Initial Biopsy   Cervical biopsies, ECC  A. 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 CERVIX, 12:00, BIOPSY:  Microinvasive squamous cell carcinoma (0.25 mm depth) arising within  severe dysplasia to carcinoma in situ  Marked chronic and follicular cervicitis with squamous metaplasia   C. ENDOCERVICAL, CURRETAGE:  Numerous detached fragments of squamous cell carcinoma and severe  squamous dysplasia    06/29/2022 Imaging   PET: 1. Very low-grade activity in the vicinity of the cervix, maximum SUV 4.1, possibly reflecting the cervical malignancy. No findings of metastatic disease. 2. Small but hypermetabolic paratracheal, bilateral hilar/infrahilar, and subcarinal lymph nodes, at least one of which is partially calcified, raising some suspicion for granulomatous process such as sarcoidosis, although strictly speaking, lymphoma is not excluded. 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 3. Accentuated palatine tonsillar uptake bilaterally, probably physiologic but technically nonspecific. There is also a small but mildly hypermetabolic left level IIa lymph node with maximum SUV of 4.5. 4. Other imaging findings of potential clinical significance: Acute on chronic paranasal sinusitis. Mild cardiomegaly. Simple  appearing adnexal cysts warrant no further workup.   06/30/2022 Surgery   [facility], ECC  A. CERVIX, CONE:  - Invasive moderately differentiated squamous cell carcinoma arising in  background of extensive carcinoma in situ involving underlying  endocervical glands  - Invasive carcinoma is present in 6-9 and 9-12 o'clock quadrants while  12-3 o'clock and 3-6 o'clock quadrants show presence of carcinoma in  situ  - Carcinoma invades for a depth of at least 0.5 cm (6-9 o'clock  quadrant)  - The deep resection margin is widely positive for carcinoma (6-9  o'clock quadrant)  - All lateral mucosal margins are negative for invasive carcinoma  - Ecto- and endocervical margins in 12-3 o'clock, 3-6 o'clock and 6-9  o'clock quadrants are positive for carcinoma in situ; mucosal margins in  the 9-12 o'clock quadrants are negative   B. ENDOCERVIX, POST CONE, CURETTAGE:  - At least squamous cell carcinoma in situ  - Focal invasive carcinoma cannot be ruled out     Surgery   Type III radical hysterectomy with bilateral salpingoophorectomy and bilateral pelvic lymphadenectomy   On EUA, well-healed cervix, mildly firm suspected secondary to recent cold knife cone procedure.  On intra-abdominal entry, normal upper abdominal survey, no enlarged para-aortic lymph nodes on palpation.  Normal-appearing small and large bowel.  Uterus approximately 6 cm and normal in appearance.  Normal bilateral tubes and ovaries.  No obvious adenopathy with very little lymphatic tissue in the pelvic basins.     Pathologic Stage   A. LYMPH NODE, LEFT PELVIC, EXCISION: - Negative for carcinoma (0/1)  B. LYMPH NODE, RIGHT PELVIC, EXCISION: - Negative for carcinoma (0/1)  C. 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 POSTERIOR MARGIN: - Negative for dysplasia or carcinoma  E. ANTERIOR MARGIN: - Negative for dysplasia or carcinoma  COMMENT:  Multiple levels were examined in the sections from the cervix to rule out microinvasion, and no foci of invasion were identified. Appropriately controlled p16 immunohistochemical stain is positive in the tumor. Slides from part C were reviewed with Dr. Luisa Hart who agrees with the above interpretation.      Interval History: Doing very well.  Denies any vaginal bleeding or discharge.  Reports baseline bowel bladder function.  Denies any abdominal or pelvic pain.  Has had significant improvement in multiple symptoms since starting estrogen replacement including hot flashes, sleep, and hair loss.  Past Medical/Surgical History: Past Medical History:  Diagnosis Date   Allergy    Amoxicillin   Asthma, mild    followed by pcp   Chronic kidney disease 2008   surgery for Kidney blockage   Depression    GAD (generalized anxiety disorder)    GERD (gastroesophageal reflux disease)    History of hiatal hernia    History of obstruction of ureter 2005   s/p  left ureteral stent (per pt not due to stone,  resolved)   Malignant neoplasm cervix (HCC) 06/07/2022   SCC   Pneumonia    Wears dentures    full upper   Wears glasses     Past Surgical History:  Procedure Laterality Date   ABDOMINAL HYSTERECTOMY  2023   CERVICAL CONIZATION W/BX N/A 06/30/2022   Procedure: CONIZATION CERVIX WITH BIOPSY; POST CONE ENDOCERVICAL CURRETTAGE;  Surgeon: Carver Fila, MD;  Location: [facility];  Service: Gynecology;  Laterality: N/A;   CESAREAN SECTION  1993    COLONOSCOPY WITH PROPOFOL  06/21/2022   COLONOSCOPY WITH PROPOFOL N/A 06/21/2022   Procedure: COLONOSCOPY WITH PROPOFOL;  Surgeon: Lanelle Bal, DO;  [facility]: [facility];  Service: Endoscopy;  Laterality: N/A;  8:45 am ASA 2   CYSTOSCOPY WITH RETROGRADE PYELOGRAM, URETEROSCOPY AND STENT PLACEMENT Left 2005   for left upj obstructions (not due to stone)   ESOPHAGOGASTRODUODENOSCOPY (EGD) WITH PROPOFOL N/A 06/21/2022   Procedure: ESOPHAGOGASTRODUODENOSCOPY (EGD) WITH PROPOFOL;  Surgeon: Lanelle Bal, DO;  Location: [facility];  Service: Endoscopy;  Laterality: N/A;   FRACTURE SURGERY  2010   LAPAROSCOPIC TUBAL LIGATION Bilateral 1998   LYMPH NODE DISSECTION N/A 08/10/2022   Procedure: PELVIC LYMPH NODE DISSECTION;  Surgeon: Carver Fila, MD;  Location: [facility];  Service: Gynecology;  Laterality: N/A;   ORIF ANKLE FRACTURE Right 2010   has retained hardware   POLYPECTOMY  06/21/2022   Procedure: POLYPECTOMY;  Surgeon: Lanelle Bal, DO;  Location: [facility];  Service: Endoscopy;;   RADICAL HYSTERECTOMY N/A 08/10/2022   Procedure: RADICAL HYSTERECTOMY, BILATERAL SALPINGO-OOPHORECTOMY;  Surgeon: Carver Fila, MD;  Location: [facility];  Service: Gynecology;  Laterality: N/A;   TUBAL LIGATION  1998    Family History  Problem Relation Age of Onset   Hypothyroidism Mother    Hypertension Mother    Anxiety disorder Mother    Arthritis Mother    Hypertension Father    Stroke Father    Hypothyroidism Sister    ADD /  ADHD Sister    Asthma Sister    ADD / ADHD Sister    ADD / ADHD Sister    ADD / ADHD Brother    ADD / ADHD Brother    Colon cancer Brother    Depression Daughter    Depression Daughter    Colon polyps Neg Hx    Breast cancer Neg Hx    Ovarian cancer Neg Hx    Endometrial cancer Neg Hx    Pancreatic cancer Neg Hx    Prostate cancer Neg Hx     Social History   Socioeconomic History   Marital status: Divorced    Spouse name: Not on  file   Number of children: 4   Years of education: Not on file   Highest education level: Associate degree: occupational, Scientist, product/process development, or vocational program  Occupational History   Not on file  Tobacco Use   Smoking status: Never   Smokeless tobacco: Never  Vaping Use   Vaping status: Never Used  Substance and Sexual Activity   Alcohol use: Not Currently    Comment: seldom   Drug use: Never   Sexual activity: Not Currently    Partners: Male    Birth control/protection: Surgical  Other Topics Concern   Not on file  Social History Narrative   Lives alone   4 children-one in [place], [place], [place], and [place] in the [employer]    [place] children 2      Enjoy: spending time with family, children visit often       Diets: eats all food groups    Caffeine: 1 cup coffee twice week   Water: 6-8 cups      Wears seat belt    Does not use phone while driving-handfree   Smoke detectors at home   No weapons in the house    Social Determinants of Health   Financial Resource Strain: Medium Risk (06/06/2023)   Overall Financial Resource Strain (CARDIA)    Difficulty of Paying Living Expenses: Somewhat hard  Food Insecurity: Food Insecurity Present (06/12/2023)   Hunger Vital Sign    Worried About Running Out of Food in the Last Year: Sometimes true    Ran Out of Food in the Last Year: Never true  Transportation Needs: No Transportation Needs (06/06/2023)   PRAPARE - Administrator, Civil Service (Medical): No    Lack of Transportation (Non-Medical): No  Physical Activity: Insufficiently Active (06/06/2023)   Exercise Vital Sign    Days of Exercise per Week: 2 days    Minutes of Exercise per Session: 20 min  Stress: Stress Concern Present (06/06/2023)   [employer] of Occupational Health - Occupational Stress Questionnaire    Feeling of Stress : To some extent  Social Connections: Unknown (06/12/2023)   Social Connection and Isolation Panel [NHANES]    Frequency of  Communication with Friends and Family: More than three times a week    Frequency of Social Gatherings with Friends and Family: Twice a week    Attends Religious Services: Patient declined    Database administrator or Organizations: No    Attends Engineer, structural: Never    Marital Status: Divorced    Current Medications:  Current Outpatient Medications:    ADVAIR HFA 230-21 MCG/ACT inhaler, INHALE 2 PUFFS INTO THE LUNGS TWICE A DAY, Disp: 12 each, Rfl: 5   albuterol (VENTOLIN HFA) 108 (90 Base) MCG/ACT inhaler, TAKE 2 PUFFS BY MOUTH EVERY 6 HOURS AS  NEEDED FOR WHEEZE OR SHORTNESS OF BREATH, Disp: 8.5 each, Rfl: 3   cholecalciferol (VITAMIN D3) 25 MCG (1000 UNIT) tablet, Take 1,000 Units by mouth daily., Disp: , Rfl:    cyanocobalamin (VITAMIN B12) 1000 MCG tablet, Take 1,000 mcg by mouth daily., Disp: , Rfl:    estradiol (VIVELLE-DOT) 0.025 MG/24HR, PLACE 1 PATCH ONTO THE SKIN 2 TIMES A WEEK., Disp: 24 patch, Rfl: 5   ferrous sulfate (FERROUSUL) 325 (65 FE) MG tablet, Take 1 tablet (325 mg total) by mouth every other day., Disp: 30 tablet, Rfl: 3   losartan (COZAAR) 50 MG tablet, TAKE 1 TABLET BY MOUTH EVERY DAY, Disp: 90 tablet, Rfl: 1   Magnesium 250 MG TABS, Take 1 tablet by mouth daily., Disp: , Rfl:    Misc. Devices MISC, Blood pressure cuff/device - 1. ICD10: I10, Disp: 1 each, Rfl: 0   omeprazole (PRILOSEC) 20 MG capsule, TAKE 1 CAPSULE BY MOUTH EVERY DAY (Patient taking differently: Take 20 mg by mouth daily.), Disp: 90 capsule, Rfl: 1   Probiotic Product (PROBIOTIC DAILY PO), Take 1 capsule by mouth daily., Disp: , Rfl:    rosuvastatin (CRESTOR) 10 MG tablet, TAKE 1 TABLET BY MOUTH EVERY DAY, Disp: 90 tablet, Rfl: 1   sertraline (ZOLOFT) 25 MG tablet, TAKE 1 TABLET (25 MG TOTAL) BY MOUTH DAILY., Disp: 90 tablet, Rfl: 2   vitamin C (ASCORBIC ACID) 250 MG tablet, Take 250 mg by mouth daily., Disp: , Rfl:   Review of Systems: Denies appetite changes, fevers, chills,  fatigue, unexplained weight changes. Denies hearing loss, neck lumps or masses, mouth sores, ringing in ears or voice changes. Denies cough or wheezing.  Denies shortness of breath. Denies chest pain or palpitations. Denies leg swelling. Denies abdominal distention, pain, blood in stools, constipation, diarrhea, nausea, vomiting, or early satiety. Denies pain with intercourse, dysuria, frequency, hematuria or incontinence. Denies hot flashes, pelvic pain, vaginal bleeding or vaginal discharge.   Denies joint pain, back pain or muscle pain/cramps. Denies itching, rash, or wounds. Denies dizziness, headaches, numbness or seizures. Denies swollen lymph nodes or glands, denies easy bruising or bleeding. Denies anxiety, depression, confusion, or decreased concentration.  Physical Exam: BP (!) 156/88 Comment: manual recheck, MD notified, pt to monitor at home and follow up with PCP  Pulse 66   Temp 98.6 F (37 C) (Oral)   Resp 20   Wt 155 lb 3.2 oz (70.4 kg)   SpO2 98%   BMI 29.68 kg/m  General: Alert, oriented, no acute distress. HEENT: Normocephalic, atraumatic, sclera anicteric. Chest: Unlabored breathing on room air.  No wheezes or rhonchi. Cardiovascular: Regular rate and rhythm, no murmurs or rubs appreciated. Abdomen: soft, nontender.  Normoactive bowel sounds.  No masses or hepatosplenomegaly appreciated.  Well-healed incision, remainder of Dermabond removed. Extremities: Grossly normal range of motion.  Warm, well perfused.  No edema bilaterally. Skin: No rashes or lesions noted. GU: Normal appearing external genitalia without erythema, excoriation, or lesions.  Speculum exam reveals cuff intact, no masses, bleeding, or discharge noted.  Pap and HPV collected.  On bimanual exam, the cuff is smooth, no masses or nodularity.  Rectovaginal exam confirms this.  Laboratory & Radiologic Studies: None new  Assessment & Plan: Katherine [name] is a 57 y.o. 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 Surgery 08/2022. No LVI.    Doing well, NED on exam.  Pap  and HPV collected today.   Patient started on low-dose estrogen replacement therapy at her last visit.  She has had resolution of all symptoms since.     Per NCCN surveillance recommendations, given low risk disease, we will plan on visits every 6 months.  We discussed performing Pap and HPV testing every year.  We reviewed signs and symptoms that would be concerning for cancer recurrence, and I stressed the importance of calling if she develops any of these between visits.  20 minutes of total time was spent for this patient encounter, including preparation, face-to-face counseling with the patient and coordination of care, and documentation of the encounter.  Eugene Garnet, MD  Division of Gynecologic Oncology  Department of Obstetrics and Gynecology  [facility]

## 2023-09-13 LAB — CYTOLOGY - PAP
Adequacy: ABSENT
Comment: NEGATIVE
Diagnosis: NEGATIVE
High risk HPV: NEGATIVE

## 2023-09-14 ENCOUNTER — Ambulatory Visit: Payer: Medicare HMO | Admitting: Gynecologic Oncology

## 2023-09-18 ENCOUNTER — Telehealth: Payer: Self-pay | Admitting: *Deleted

## 2023-09-18 NOTE — Progress Notes (Signed)
Could you please call her with results of her recent pap? Doesn't look like she saw my message about the results. Thanks!

## 2023-09-18 NOTE — Telephone Encounter (Signed)
-----   Message from Carver Fila sent at 09/18/2023  1:33 PM EST ----- Could you please call her with results of her recent pap? Doesn't look like she saw my message about the results. Thanks!

## 2023-09-18 NOTE — Telephone Encounter (Signed)
Spoke with patient and relayed message from Dr. Pricilla Holm that her recent Pap & HPV testing are both negative. Great News! Pt thanked the office for calling and to Dr. Pricilla Holm -patient greatly appreciates you!

## 2023-10-16 ENCOUNTER — Telehealth: Payer: Medicare HMO | Admitting: Family Medicine

## 2023-10-16 DIAGNOSIS — J019 Acute sinusitis, unspecified: Secondary | ICD-10-CM

## 2023-10-16 DIAGNOSIS — B9689 Other specified bacterial agents as the cause of diseases classified elsewhere: Secondary | ICD-10-CM

## 2023-10-16 MED ORDER — DOXYCYCLINE HYCLATE 100 MG PO TABS
100.0000 mg | ORAL_TABLET | Freq: Two times a day (BID) | ORAL | 0 refills | Status: AC
Start: 2023-10-16 — End: 2023-10-23

## 2023-10-16 MED ORDER — PROMETHAZINE-DM 6.25-15 MG/5ML PO SYRP
5.0000 mL | ORAL_SOLUTION | Freq: Four times a day (QID) | ORAL | 0 refills | Status: DC | PRN
Start: 2023-10-16 — End: 2024-02-27

## 2023-10-16 NOTE — Progress Notes (Signed)
 Virtual Visit Consent   Katherine [name], you are scheduled for a virtual visit with a [facility] provider today. Just as with appointments in the office, your consent must be obtained to participate. Your consent will be active for this visit and any virtual visit you may have with one of our providers in the next 365 days. If you have a MyChart account, a copy of this consent can be sent to you electronically.  As this is a virtual visit, video technology does not allow for your provider to perform a traditional examination. This may limit your provider's ability to fully assess your condition. If your provider identifies any concerns that need to be evaluated in person or the need to arrange testing (such as labs, EKG, etc.), we will make arrangements to do so. Although advances in technology are sophisticated, we cannot ensure that it will always work on either your end or our end. If the connection with a video visit is poor, the visit may have to be switched to a telephone visit. With either a video or telephone visit, we are not always able to ensure that we have a secure connection.  By engaging in this virtual visit, you consent to the provision of healthcare and authorize for your insurance to be billed (if applicable) for the services provided during this visit. Depending on your insurance coverage, you may receive a charge related to this service.  I need to obtain your verbal consent now. Are you willing to proceed with your visit today? Katherine [name] has provided verbal consent on 10/16/2023 for a virtual visit (video or telephone). Katherine CHRISTELLA Barefoot, NP  Date: 10/16/2023 1:31 PM  Virtual Visit via Video Note   I, Katherine [name], connected with  Katherine [name]  ([id number], September 12, 1966) on 10/16/23 at  1:45 PM EST by a video-enabled telemedicine application and verified that I am speaking with the correct person using two identifiers.  Location: Patient: Virtual Visit Location Patient:  Home Provider: Virtual Visit Location Provider: Home Office   I discussed the limitations of evaluation and management by telemedicine and the availability of in person appointments. The patient expressed understanding and agreed to proceed.    History of Present Illness: Katherine [name] is a 58 y.o. who identifies as a female who was assigned female at birth, and is being seen today for URI sinus infection  Onset was 6 days ago- progressed from sore throat, to congestion, headache, eye pressure, mucus production, cough, runny nose, chills Modifying factors are OTC cough and cold medication not helping  Denies chest pain, shortness of breath, fevers.  Exposure to sick contacts- unknown COVID test: neg Vaccines: not UTD  Problems:  Patient Active Problem List   Diagnosis Date Noted   Prolapsed internal hemorrhoids, grade 2 09/08/2022   Tinnitus of both ears 08/29/2022   Mixed hyperlipidemia 08/29/2022   Iron deficiency anemia 08/29/2022   CIN III (cervical intraepithelial neoplasia grade III) with severe dysplasia    Malignant neoplasm of cervix (HCC) 06/07/2022   Hemorrhoids 05/24/2022   Encounter for general adult medical examination with abnormal findings 04/27/2022   Depression, recurrent (HCC) 04/27/2022   GERD (gastroesophageal reflux disease) 11/09/2021   Essential hypertension 04/21/2020   Dysuria 04/21/2020   Mild persistent asthma 04/21/2020    Allergies:  Allergies  Allergen Reactions   Amoxicillin  Hives   Medications:  Current Outpatient Medications:    ADVAIR HFA 230-21 MCG/ACT inhaler, INHALE 2 PUFFS INTO THE LUNGS TWICE A DAY, Disp: 12  each, Rfl: 5   albuterol  (VENTOLIN  HFA) 108 (90 Base) MCG/ACT inhaler, TAKE 2 PUFFS BY MOUTH EVERY 6 HOURS AS NEEDED FOR WHEEZE OR SHORTNESS OF BREATH, Disp: 8.5 each, Rfl: 3   cholecalciferol (VITAMIN D3) 25 MCG (1000 UNIT) tablet, Take 1,000 Units by mouth daily., Disp: , Rfl:    cyanocobalamin (VITAMIN B12) 1000 MCG tablet, Take  1,000 mcg by mouth daily., Disp: , Rfl:    estradiol  (VIVELLE -DOT) 0.025 MG/24HR, PLACE 1 PATCH ONTO THE SKIN 2 TIMES A WEEK., Disp: 24 patch, Rfl: 5   ferrous sulfate  (FERROUSUL) 325 (65 FE) MG tablet, Take 1 tablet (325 mg total) by mouth every other day., Disp: 30 tablet, Rfl: 3   losartan  (COZAAR ) 50 MG tablet, TAKE 1 TABLET BY MOUTH EVERY DAY, Disp: 90 tablet, Rfl: 1   Magnesium 250 MG TABS, Take 1 tablet by mouth daily., Disp: , Rfl:    Misc. Devices MISC, Blood pressure cuff/device - 1. ICD10: I10, Disp: 1 each, Rfl: 0   omeprazole  (PRILOSEC) 20 MG capsule, TAKE 1 CAPSULE BY MOUTH EVERY DAY (Patient taking differently: Take 20 mg by mouth daily.), Disp: 90 capsule, Rfl: 1   Probiotic Product (PROBIOTIC DAILY PO), Take 1 capsule by mouth daily., Disp: , Rfl:    rosuvastatin  (CRESTOR ) 10 MG tablet, TAKE 1 TABLET BY MOUTH EVERY DAY, Disp: 90 tablet, Rfl: 1   sertraline  (ZOLOFT ) 25 MG tablet, TAKE 1 TABLET (25 MG TOTAL) BY MOUTH DAILY., Disp: 90 tablet, Rfl: 2   vitamin C (ASCORBIC ACID) 250 MG tablet, Take 250 mg by mouth daily., Disp: , Rfl:   Observations/Objective: Patient is well-developed, well-nourished in no acute distress.  Resting comfortably  at home.  Head is normocephalic, atraumatic.  No labored breathing.  Speech is clear and coherent with logical content.  Patient is alert and oriented at baseline.  Congestion tone Dry cough noted   Assessment and Plan:  1. Acute bacterial sinusitis (Primary)  - doxycycline  (VIBRA -TABS) 100 MG tablet; Take 1 tablet (100 mg total) by mouth 2 (two) times daily for 7 days.  Dispense: 14 tablet; Refill: 0 - promethazine -dextromethorphan (PROMETHAZINE -DM) 6.25-15 MG/5ML syrup; Take 5 mLs by mouth 4 (four) times daily as needed for cough.  Dispense: 118 mL; Refill: 0  - Increased rest - Increasing Fluids - Acetaminophen  / ibuprofen  as needed for fever/pain.  - Salt water gargling, chloraseptic spray and throat lozenges - Mucinex if mucus  is present and increasing.  - Saline nasal spray if congestion or if nasal passages feel dry. - Humidifying the air.   Reviewed side effects, risks and benefits of medication.    Patient acknowledged agreement and understanding of the plan.   Past Medical, Surgical, Social History, Allergies, and Medications have been Reviewed.     Follow Up Instructions: I discussed the assessment and treatment plan with the patient. The patient was provided an opportunity to ask questions and all were answered. The patient agreed with the plan and demonstrated an understanding of the instructions.  A copy of instructions were sent to the patient via MyChart unless otherwise noted below.     The patient was advised to call back or seek an in-person evaluation if the symptoms worsen or if the condition fails to improve as anticipated.    Katherine CHRISTELLA Barefoot, NP

## 2023-10-16 NOTE — Patient Instructions (Signed)
 Katherine [name], thank you for joining Katherine CHRISTELLA Barefoot, NP for today's virtual visit.  While this provider is not your primary care provider (PCP), if your PCP is located in our provider database this encounter information will be shared with them immediately following your visit.   A [employer] MyChart account gives you access to today's visit and all your visits, tests, and labs performed at [facility]  click here if you don't have a [facility] MyChart account or go to [email or web address]  Consent: (Patient) Katherine [name] provided verbal consent for this virtual visit at the beginning of the encounter.  Current Medications:  Current Outpatient Medications:    doxycycline  (VIBRA -TABS) 100 MG tablet, Take 1 tablet (100 mg total) by mouth 2 (two) times daily for 7 days., Disp: 14 tablet, Rfl: 0   promethazine -dextromethorphan (PROMETHAZINE -DM) 6.25-15 MG/5ML syrup, Take 5 mLs by mouth 4 (four) times daily as needed for cough., Disp: 118 mL, Rfl: 0   ADVAIR HFA 230-21 MCG/ACT inhaler, INHALE 2 PUFFS INTO THE LUNGS TWICE A DAY, Disp: 12 each, Rfl: 5   albuterol  (VENTOLIN  HFA) 108 (90 Base) MCG/ACT inhaler, TAKE 2 PUFFS BY MOUTH EVERY 6 HOURS AS NEEDED FOR WHEEZE OR SHORTNESS OF BREATH, Disp: 8.5 each, Rfl: 3   cholecalciferol (VITAMIN D3) 25 MCG (1000 UNIT) tablet, Take 1,000 Units by mouth daily., Disp: , Rfl:    cyanocobalamin (VITAMIN B12) 1000 MCG tablet, Take 1,000 mcg by mouth daily., Disp: , Rfl:    estradiol  (VIVELLE -DOT) 0.025 MG/24HR, PLACE 1 PATCH ONTO THE SKIN 2 TIMES A WEEK., Disp: 24 patch, Rfl: 5   ferrous sulfate  (FERROUSUL) 325 (65 FE) MG tablet, Take 1 tablet (325 mg total) by mouth every other day., Disp: 30 tablet, Rfl: 3   losartan  (COZAAR ) 50 MG tablet, TAKE 1 TABLET BY MOUTH EVERY DAY, Disp: 90 tablet, Rfl: 1   Magnesium 250 MG TABS, Take 1 tablet by mouth daily., Disp: , Rfl:    Misc. Devices MISC, Blood pressure cuff/device - 1. ICD10: I10, Disp: 1 each,  Rfl: 0   omeprazole  (PRILOSEC) 20 MG capsule, TAKE 1 CAPSULE BY MOUTH EVERY DAY (Patient taking differently: Take 20 mg by mouth daily.), Disp: 90 capsule, Rfl: 1   Probiotic Product (PROBIOTIC DAILY PO), Take 1 capsule by mouth daily., Disp: , Rfl:    rosuvastatin  (CRESTOR ) 10 MG tablet, TAKE 1 TABLET BY MOUTH EVERY DAY, Disp: 90 tablet, Rfl: 1   sertraline  (ZOLOFT ) 25 MG tablet, TAKE 1 TABLET (25 MG TOTAL) BY MOUTH DAILY., Disp: 90 tablet, Rfl: 2   vitamin C (ASCORBIC ACID) 250 MG tablet, Take 250 mg by mouth daily., Disp: , Rfl:    Medications ordered in this encounter:  Meds ordered this encounter  Medications   doxycycline  (VIBRA -TABS) 100 MG tablet    Sig: Take 1 tablet (100 mg total) by mouth 2 (two) times daily for 7 days.    Dispense:  14 tablet    Refill:  0    Supervising Provider:   LAMPTEY, PHILIP O [[id number]]   promethazine -dextromethorphan (PROMETHAZINE -DM) 6.25-15 MG/5ML syrup    Sig: Take 5 mLs by mouth 4 (four) times daily as needed for cough.    Dispense:  118 mL    Refill:  0    Supervising Provider:   BLAISE ALEENE KIDD [[id number]]     *If you need refills on other medications prior to your next appointment, please contact your pharmacy*  Follow-Up: Call back or seek an in-person evaluation if  the symptoms worsen or if the condition fails to improve as anticipated.  [facility] [phone]  Other Instructions  - Increased rest - Increasing Fluids - Acetaminophen  / ibuprofen  as needed for fever/pain.  - Salt water gargling, chloraseptic spray and throat lozenges - Mucinex if mucus is present and increasing.  - Saline nasal spray if congestion or if nasal passages feel dry. - Humidifying the air.     If you have been instructed to have an in-person evaluation today at a local Urgent Care facility, please use the link below. It will take you to a list of all of our available [place] [facility], including address, phone number and hours of  operation. Please do not delay care.  [place] [facility]  If you or a family member do not have a primary care provider, use the link below to schedule a visit and establish care. When you choose a [facility] primary care physician or advanced practice provider, you gain a long-term partner in health. Find a Primary Care Provider  Learn more about [facility]'s in-office and virtual care options: [facility] - Get Care Now

## 2023-11-04 ENCOUNTER — Other Ambulatory Visit: Payer: Self-pay | Admitting: Internal Medicine

## 2023-11-04 DIAGNOSIS — F339 Major depressive disorder, recurrent, unspecified: Secondary | ICD-10-CM

## 2023-11-04 DIAGNOSIS — I10 Essential (primary) hypertension: Secondary | ICD-10-CM

## 2023-11-04 DIAGNOSIS — E782 Mixed hyperlipidemia: Secondary | ICD-10-CM

## 2023-11-16 ENCOUNTER — Ambulatory Visit: Payer: Medicare HMO | Admitting: Orthopedic Surgery

## 2023-12-07 ENCOUNTER — Telehealth: Admitting: Family Medicine

## 2023-12-07 DIAGNOSIS — R3989 Other symptoms and signs involving the genitourinary system: Secondary | ICD-10-CM | POA: Diagnosis not present

## 2023-12-07 MED ORDER — SULFAMETHOXAZOLE-TRIMETHOPRIM 800-160 MG PO TABS: 1.0000 | ORAL_TABLET | Freq: Two times a day (BID) | ORAL | 0 refills | Status: AC

## 2023-12-07 NOTE — Progress Notes (Signed)
 Virtual Visit Consent   Katherine [name], you are scheduled for a virtual visit with a [facility] provider today. Just as with appointments in the office, your consent must be obtained to participate. Your consent will be active for this visit and any virtual visit you may have with one of our providers in the next 365 days. If you have a MyChart account, a copy of this consent can be sent to you electronically.  As this is a virtual visit, video technology does not allow for your provider to perform a traditional examination. This may limit your provider's ability to fully assess your condition. If your provider identifies any concerns that need to be evaluated in person or the need to arrange testing (such as labs, EKG, etc.), we will make arrangements to do so. Although advances in technology are sophisticated, we cannot ensure that it will always work on either your end or our end. If the connection with a video visit is poor, the visit may have to be switched to a telephone visit. With either a video or telephone visit, we are not always able to ensure that we have a secure connection.  By engaging in this virtual visit, you consent to the provision of healthcare and authorize for your insurance to be billed (if applicable) for the services provided during this visit. Depending on your insurance coverage, you may receive a charge related to this service.  I need to obtain your verbal consent now. Are you willing to proceed with your visit today? Katherine [name] has provided verbal consent on 12/07/2023 for a virtual visit (video or telephone). Freddy Finner, NP  Date: 12/07/2023 11:47 AM   Virtual Visit via Video Note   I, Freddy Finner, connected with  Katherine [name]  ([id number], 1966-05-08) on 12/07/23 at 11:45 AM EST by a video-enabled telemedicine application and verified that I am speaking with the correct person using two identifiers.  Location: Patient: Virtual Visit Location Patient:  Home Provider: Virtual Visit Location Provider: Home Office   I discussed the limitations of evaluation and management by telemedicine and the availability of in person appointments. The patient expressed understanding and agreed to proceed.    History of Present Illness: Katherine [name] is a 58 y.o. who identifies as a female who was assigned female at birth, and is being seen today for UTI and URI symptoms  Onset was 6 days ago- with stomach upset, then Monday started feeling the UTI symptoms- and in last two days started to have URI symptoms.  Associated symptoms are all the over is going on at the same time.  Modifying factors are tylenol, nyquil, and OTC meds, nasal spray. AZO for UTI and increased water- mild improvement.  Denies chest pain, shortness of breath, fevers, chills  Exposure to sick contacts- unknown  Problems:  Patient Active Problem List   Diagnosis Date Noted   Prolapsed internal hemorrhoids, grade 2 09/08/2022   Tinnitus of both ears 08/29/2022   Mixed hyperlipidemia 08/29/2022   Iron deficiency anemia 08/29/2022   CIN III (cervical intraepithelial neoplasia grade III) with severe dysplasia    Malignant neoplasm of cervix (HCC) 06/07/2022   Hemorrhoids 05/24/2022   Encounter for general adult medical examination with abnormal findings 04/27/2022   Depression, recurrent (HCC) 04/27/2022   GERD (gastroesophageal reflux disease) 11/09/2021   Essential hypertension 04/21/2020   Dysuria 04/21/2020   Mild persistent asthma 04/21/2020    Allergies:  Allergies  Allergen Reactions   Amoxicillin Hives  Medications:  Current Outpatient Medications:    ADVAIR HFA 230-21 MCG/ACT inhaler, INHALE 2 PUFFS INTO THE LUNGS TWICE A DAY, Disp: 12 each, Rfl: 5   albuterol (VENTOLIN HFA) 108 (90 Base) MCG/ACT inhaler, TAKE 2 PUFFS BY MOUTH EVERY 6 HOURS AS NEEDED FOR WHEEZE OR SHORTNESS OF BREATH, Disp: 8.5 each, Rfl: 3   cholecalciferol (VITAMIN D3) 25 MCG (1000 UNIT) tablet,  Take 1,000 Units by mouth daily., Disp: , Rfl:    cyanocobalamin (VITAMIN B12) 1000 MCG tablet, Take 1,000 mcg by mouth daily., Disp: , Rfl:    estradiol (VIVELLE-DOT) 0.025 MG/24HR, PLACE 1 PATCH ONTO THE SKIN 2 TIMES A WEEK., Disp: 24 patch, Rfl: 5   ferrous sulfate (FERROUSUL) 325 (65 FE) MG tablet, Take 1 tablet (325 mg total) by mouth every other day., Disp: 30 tablet, Rfl: 3   losartan (COZAAR) 50 MG tablet, TAKE 1 TABLET BY MOUTH EVERY DAY, Disp: 90 tablet, Rfl: 1   Magnesium 250 MG TABS, Take 1 tablet by mouth daily., Disp: , Rfl:    Misc. Devices MISC, Blood pressure cuff/device - 1. ICD10: I10, Disp: 1 each, Rfl: 0   omeprazole (PRILOSEC) 20 MG capsule, TAKE 1 CAPSULE BY MOUTH EVERY DAY (Patient taking differently: Take 20 mg by mouth daily.), Disp: 90 capsule, Rfl: 1   Probiotic Product (PROBIOTIC DAILY PO), Take 1 capsule by mouth daily., Disp: , Rfl:    promethazine-dextromethorphan (PROMETHAZINE-DM) 6.25-15 MG/5ML syrup, Take 5 mLs by mouth 4 (four) times daily as needed for cough., Disp: 118 mL, Rfl: 0   rosuvastatin (CRESTOR) 10 MG tablet, TAKE 1 TABLET BY MOUTH EVERY DAY, Disp: 90 tablet, Rfl: 1   sertraline (ZOLOFT) 25 MG tablet, TAKE 1 TABLET (25 MG TOTAL) BY MOUTH DAILY., Disp: 90 tablet, Rfl: 2   vitamin C (ASCORBIC ACID) 250 MG tablet, Take 250 mg by mouth daily., Disp: , Rfl:   Observations/Objective: Patient is well-developed, well-nourished in no acute distress.  Resting comfortably  at home.  Head is normocephalic, atraumatic.  No labored breathing.  Speech is clear and coherent with logical content.  Patient is alert and oriented at baseline.    Assessment and Plan:  1. Suspected UTI (Primary)  - sulfamethoxazole-trimethoprim (BACTRIM DS) 800-160 MG tablet; Take 1 tablet by mouth 2 (two) times daily for 7 days.  Dispense: 14 tablet; Refill: 0  -given the overlap and duration- no antiviral given -will treat UTI and with bactrim some URI coverage if that is  needed. -follow up with PCP if not improving.  - Increased rest - Increasing Fluids - Acetaminophen / ibuprofen as needed for fever/pain.  - Salt water gargling, chloraseptic spray and throat lozenges - Mucinex if mucus is present and increasing.  - Saline nasal spray if congestion or if nasal passages feel dry. - Humidifying the air.   Reviewed side effects, risks and benefits of medication.    Patient acknowledged agreement and understanding of the plan.   Past Medical, Surgical, Social History, Allergies, and Medications have been Reviewed.    Follow Up Instructions: I discussed the assessment and treatment plan with the patient. The patient was provided an opportunity to ask questions and all were answered. The patient agreed with the plan and demonstrated an understanding of the instructions.  A copy of instructions were sent to the patient via MyChart unless otherwise noted below.    The patient was advised to call back or seek an in-person evaluation if the symptoms worsen or if the condition fails  to improve as anticipated.    Freddy Finner, NP

## 2023-12-07 NOTE — Patient Instructions (Addendum)
 [name], thank you for joining Freddy Finner, NP for today's virtual visit.  While this provider is not your primary care provider (PCP), if your PCP is located in our provider database this encounter information will be shared with them immediately following your visit.   A [facility] MyChart account gives you access to today's visit and all your visits, tests, and labs performed at [facility] " click here if you don't have a [facility] MyChart account or go to [email or web address]  Consent: (Patient) Joceline Hinchcliff provided verbal consent for this virtual visit at the beginning of the encounter.  Current Medications:  Current Outpatient Medications:    sulfamethoxazole-trimethoprim (BACTRIM DS) 800-160 MG tablet, Take 1 tablet by mouth 2 (two) times daily for 7 days., Disp: 14 tablet, Rfl: 0   ADVAIR HFA 230-21 MCG/ACT inhaler, INHALE 2 PUFFS INTO THE LUNGS TWICE A DAY, Disp: 12 each, Rfl: 5   albuterol (VENTOLIN HFA) 108 (90 Base) MCG/ACT inhaler, TAKE 2 PUFFS BY MOUTH EVERY 6 HOURS AS NEEDED FOR WHEEZE OR SHORTNESS OF BREATH, Disp: 8.5 each, Rfl: 3   cholecalciferol (VITAMIN D3) 25 MCG (1000 UNIT) tablet, Take 1,000 Units by mouth daily., Disp: , Rfl:    cyanocobalamin (VITAMIN B12) 1000 MCG tablet, Take 1,000 mcg by mouth daily., Disp: , Rfl:    estradiol (VIVELLE-DOT) 0.025 MG/24HR, PLACE 1 PATCH ONTO THE SKIN 2 TIMES A WEEK., Disp: 24 patch, Rfl: 5   ferrous sulfate (FERROUSUL) 325 (65 FE) MG tablet, Take 1 tablet (325 mg total) by mouth every other day., Disp: 30 tablet, Rfl: 3   losartan (COZAAR) 50 MG tablet, TAKE 1 TABLET BY MOUTH EVERY DAY, Disp: 90 tablet, Rfl: 1   Magnesium 250 MG TABS, Take 1 tablet by mouth daily., Disp: , Rfl:    Misc. Devices MISC, Blood pressure cuff/device - 1. ICD10: I10, Disp: 1 each, Rfl: 0   omeprazole (PRILOSEC) 20 MG capsule, TAKE 1 CAPSULE BY MOUTH EVERY DAY (Patient taking differently: Take 20 mg by mouth daily.), Disp: 90  capsule, Rfl: 1   Probiotic Product (PROBIOTIC DAILY PO), Take 1 capsule by mouth daily., Disp: , Rfl:    promethazine-dextromethorphan (PROMETHAZINE-DM) 6.25-15 MG/5ML syrup, Take 5 mLs by mouth 4 (four) times daily as needed for cough., Disp: 118 mL, Rfl: 0   rosuvastatin (CRESTOR) 10 MG tablet, TAKE 1 TABLET BY MOUTH EVERY DAY, Disp: 90 tablet, Rfl: 1   sertraline (ZOLOFT) 25 MG tablet, TAKE 1 TABLET (25 MG TOTAL) BY MOUTH DAILY., Disp: 90 tablet, Rfl: 2   vitamin C (ASCORBIC ACID) 250 MG tablet, Take 250 mg by mouth daily., Disp: , Rfl:    Medications ordered in this encounter:  Meds ordered this encounter  Medications   sulfamethoxazole-trimethoprim (BACTRIM DS) 800-160 MG tablet    Sig: Take 1 tablet by mouth 2 (two) times daily for 7 days.    Dispense:  14 tablet    Refill:  0    Supervising Provider:   Merrilee Jansky [[id number]]     *If you need refills on other medications prior to your next appointment, please contact your pharmacy*  Follow-Up: Call back or seek an in-person evaluation if the symptoms worsen or if the condition fails to improve as anticipated.  [facility] [phone]  Other Instructions  - Increased rest - Increasing Fluids - Acetaminophen / ibuprofen as needed for fever/pain.  - Salt water gargling, chloraseptic spray and throat lozenges - Mucinex if mucus is  present and increasing.  - Saline nasal spray if congestion or if nasal passages feel dry. - Humidifying the air.   If you have been instructed to have an in-person evaluation today at a local Urgent Care facility, please use the link below. It will take you to a list of all of our available [facility], including address, phone number and hours of operation. Please do not delay care.  [facility]  If you or a family member do not have a primary care provider, use the link below to schedule a visit and establish care. When you choose a [facility]  primary care physician or advanced practice provider, you gain a long-term partner in health. Find a Primary Care Provider  Learn more about [facility]'s in-office and virtual care options: [facility] - Get Care Now

## 2023-12-29 DIAGNOSIS — N390 Urinary tract infection, site not specified: Secondary | ICD-10-CM | POA: Diagnosis not present

## 2024-02-08 ENCOUNTER — Telehealth

## 2024-02-08 DIAGNOSIS — R3989 Other symptoms and signs involving the genitourinary system: Secondary | ICD-10-CM

## 2024-02-08 MED ORDER — NITROFURANTOIN MONOHYD MACRO 100 MG PO CAPS: 100.0000 mg | ORAL_CAPSULE | Freq: Two times a day (BID) | ORAL | 0 refills | Status: DC

## 2024-02-08 MED ORDER — PHENAZOPYRIDINE HCL 100 MG PO TABS: 100.0000 mg | ORAL_TABLET | Freq: Three times a day (TID) | ORAL | 0 refills | Status: DC | PRN

## 2024-02-08 NOTE — Patient Instructions (Signed)
 Cindi Creamer, thank you for joining Angelia Kelp, PA-C for today's virtual visit.  While this provider is not your primary care provider (PCP), if your PCP is located in our provider database this encounter information will be shared with them immediately following your visit.   A [facility] MyChart account gives you access to today's visit and all your visits, tests, and labs performed at [facility] " click here if you don't have a [facility] MyChart account or go to [email or web address]  Consent: (Patient) Katherine [name] provided verbal consent for this virtual visit at the beginning of the encounter.  Current Medications:  Current Outpatient Medications:    ADVAIR HFA 230-21 MCG/ACT inhaler, INHALE 2 PUFFS INTO THE LUNGS TWICE A DAY, Disp: 12 each, Rfl: 5   albuterol  (VENTOLIN  HFA) 108 (90 Base) MCG/ACT inhaler, TAKE 2 PUFFS BY MOUTH EVERY 6 HOURS AS NEEDED FOR WHEEZE OR SHORTNESS OF BREATH, Disp: 8.5 each, Rfl: 3   cholecalciferol (VITAMIN D3) 25 MCG (1000 UNIT) tablet, Take 1,000 Units by mouth daily., Disp: , Rfl:    cyanocobalamin (VITAMIN B12) 1000 MCG tablet, Take 1,000 mcg by mouth daily., Disp: , Rfl:    estradiol  (VIVELLE -DOT) 0.025 MG/24HR, PLACE 1 PATCH ONTO THE SKIN 2 TIMES A WEEK., Disp: 24 patch, Rfl: 5   ferrous sulfate  (FERROUSUL) 325 (65 FE) MG tablet, Take 1 tablet (325 mg total) by mouth every other day., Disp: 30 tablet, Rfl: 3   losartan  (COZAAR ) 50 MG tablet, TAKE 1 TABLET BY MOUTH EVERY DAY, Disp: 90 tablet, Rfl: 1   Magnesium 250 MG TABS, Take 1 tablet by mouth daily., Disp: , Rfl:    Misc. Devices MISC, Blood pressure cuff/device - 1. ICD10: I10, Disp: 1 each, Rfl: 0   nitrofurantoin , macrocrystal-monohydrate, (MACROBID ) 100 MG capsule, Take 1 capsule (100 mg total) by mouth 2 (two) times daily., Disp: 10 capsule, Rfl: 0   omeprazole  (PRILOSEC) 20 MG capsule, TAKE 1 CAPSULE BY MOUTH EVERY DAY (Patient taking differently: Take 20 mg by mouth  daily.), Disp: 90 capsule, Rfl: 1   phenazopyridine (PYRIDIUM) 100 MG tablet, Take 1 tablet (100 mg total) by mouth 3 (three) times daily as needed for pain., Disp: 10 tablet, Rfl: 0   Probiotic Product (PROBIOTIC DAILY PO), Take 1 capsule by mouth daily., Disp: , Rfl:    promethazine -dextromethorphan (PROMETHAZINE -DM) 6.25-15 MG/5ML syrup, Take 5 mLs by mouth 4 (four) times daily as needed for cough., Disp: 118 mL, Rfl: 0   rosuvastatin  (CRESTOR ) 10 MG tablet, TAKE 1 TABLET BY MOUTH EVERY DAY, Disp: 90 tablet, Rfl: 1   sertraline  (ZOLOFT ) 25 MG tablet, TAKE 1 TABLET (25 MG TOTAL) BY MOUTH DAILY., Disp: 90 tablet, Rfl: 2   vitamin C (ASCORBIC ACID) 250 MG tablet, Take 250 mg by mouth daily., Disp: , Rfl:    Medications ordered in this encounter:  Meds ordered this encounter  Medications   phenazopyridine (PYRIDIUM) 100 MG tablet    Sig: Take 1 tablet (100 mg total) by mouth 3 (three) times daily as needed for pain.    Dispense:  10 tablet    Refill:  0    Supervising Provider:   Corine Dice [[id number]]   nitrofurantoin , macrocrystal-monohydrate, (MACROBID ) 100 MG capsule    Sig: Take 1 capsule (100 mg total) by mouth 2 (two) times daily.    Dispense:  10 capsule    Refill:  0    Supervising Provider:   Corine Dice [[id number]]     *  If you need refills on other medications prior to your next appointment, please contact your pharmacy*  Follow-Up: Call back or seek an in-person evaluation if the symptoms worsen or if the condition fails to improve as anticipated.  [facility] [phone]  Other Instructions Overactive Bladder in Adults: What to Know  Overactive bladder (OAB) is when you have trouble holding your pee. You may feel the need to pee often or all of a sudden. You may also leak pee if you can't get to a bathroom fast enough. These symptoms may get in the way of your daily life. What are the causes? OAB may be caused by poor nerve signals between your  bladder and your brain. Your bladder may get the signal to empty before it's full. Or, your bladder may be too sensitive. This can cause it to squeeze too soon. Other causes include: Medical problems, such as: A urinary tract infection (UTI). Swelling or stones in your bladder. A tumor, which is a growth of cells that isn't normal. Diabetes. Muscle or nerve weakness. This may be caused by: An injury to your spinal cord. A stroke. Multiple sclerosis. Surgery on nearby tissues. Drinking caffeine or alcohol. Some medicines. These include ones to lower the amount of fluid in your body. Trouble pooping, also called constipation. What increases the risk? You may be more at risk if: You're an older adult. You're going through menopause. You have a big prostate or problems with your prostate. You're overweight. You smoke. You eat or drink things that irritate your bladder. These may include tea or spicy food. What are the signs or symptoms? A sudden, strong need to pee. Peeing 8 or more times a day. Leaking pee. Waking up to pee 2 or more times a night. How is this diagnosed? You may be diagnosed based on your symptoms, medical history, and an exam. You may also have tests. These may include: Blood tests. Tests of your pee to check for an infection. Tests to check the flow of your pee. These are called urodynamic tests. You may also need to see an expert in problems with the bladder called a urologist. How is this treated? Treatment depends on the cause and how bad your symptoms are. It may include: Bladder training, such as: Learning to control the urge to pee by peeing at certain times. Doing Kegel exercises. These can help you strengthen the muscles that start and stop the flow of pee. Special devices, such as: Biofeedback. This uses sensors to help you learn when to pee. Electrical stimulation. This uses electricity to make the nerves and muscles of your bladder work. A pessary.  This can be put in the vagina to support the bladder. Medicines to: Treat a UTI. Stop the bladder from releasing pee at the wrong time. Calm the bladder muscles. Surgery to: Help the nerve signals that control peeing. Change the shape of your bladder. This is done only in very bad cases. Follow these instructions at home: Eating and drinking  Make changes to your diet as told. You may need to: Drink small amounts of fluid during the day rather than large amounts at one time. Take in less caffeine or alcohol. Take steps to help treat or prevent trouble pooping. You may need to eat foods high in fiber, like beans, whole grains, and fresh fruits and vegetables. Lifestyle Lose weight if needed. Do not smoke, vape, or use nicotine or tobacco. General instructions Take your medicines only as told. If  you were given antibiotics, take them as told. Do not stop taking them even if you start to feel better. Use devices as told. If needed, wear pads to absorb pee leakage. Keep track of how much you drink, when you drink, and when you need to pee. Contact a health care provider if: Your symptoms don't get better with treatment. You can't control your bladder. You have a fever or chills. This information is not intended to replace advice given to you by your health care provider. Make sure you discuss any questions you have with your health care provider. Document Revised: 04/17/2023 Document Reviewed: 04/17/2023 Elsevier Patient Education  2024 Elsevier Inc.   If you have been instructed to have an in-person evaluation today at a local Urgent Care facility, please use the link below. It will take you to a list of all of our available [facility], including address, phone number and hours of operation. Please do not delay care.  [facility]  If you or a family member do not have a primary care provider, use the link below to schedule a visit and establish care. When  you choose a [facility] primary care physician or advanced practice provider, you gain a long-term partner in health. Find a Primary Care Provider  Learn more about [facility]'s in-office and virtual care options: [facility] - Get Care Now

## 2024-02-08 NOTE — Progress Notes (Signed)
 Virtual Visit Consent   Katherine [name], you are scheduled for a virtual visit with a [facility] provider today. Just as with appointments in the office, your consent must be obtained to participate. Your consent will be active for this visit and any virtual visit you may have with one of our providers in the next 365 days. If you have a MyChart account, a copy of this consent can be sent to you electronically.  As this is a virtual visit, video technology does not allow for your provider to perform a traditional examination. This may limit your provider's ability to fully assess your condition. If your provider identifies any concerns that need to be evaluated in person or the need to arrange testing (such as labs, EKG, etc.), we will make arrangements to do so. Although advances in technology are sophisticated, we cannot ensure that it will always work on either your end or our end. If the connection with a video visit is poor, the visit may have to be switched to a telephone visit. With either a video or telephone visit, we are not always able to ensure that we have a secure connection.  By engaging in this virtual visit, you consent to the provision of healthcare and authorize for your insurance to be billed (if applicable) for the services provided during this visit. Depending on your insurance coverage, you may receive a charge related to this service.  I need to obtain your verbal consent now. Are you willing to proceed with your visit today? Katherine [name] has provided verbal consent on 02/08/2024 for a virtual visit (video or telephone). Angelia Kelp, PA-C  Date: 02/08/2024 8:04 AM   Virtual Visit via Video Note   I, Angelia Kelp, connected with  Katherine [name]  ([id number], 1966-08-05) on 02/08/24 at  8:00 AM EDT by a video-enabled telemedicine application and verified that I am speaking with the correct person using two identifiers.  Location: Patient: Virtual Visit Location Patient:  Home Provider: Virtual Visit Location Provider: Home Office   I discussed the limitations of evaluation and management by telemedicine and the availability of in person appointments. The patient expressed understanding and agreed to proceed.    History of Present Illness: Katherine [name] is a 58 y.o. who identifies as a female who was assigned female at birth, and is being seen today for UTI symptoms.  HPI: Urinary Tract Infection  This is a new problem. The current episode started in the past 7 days (2 days). The problem occurs every urination. The problem has been gradually improving. The quality of the pain is described as burning and aching. The pain is mild. There has been no fever. Associated symptoms include chills, frequency, hesitancy, nausea and urgency. Pertinent negatives include no discharge, flank pain, hematuria or vomiting. Associated symptoms comments: Low back pain. She has tried acetaminophen  and increased fluids (AZO) for the symptoms. The treatment provided no relief. Her past medical history is significant for recurrent UTIs.     Problems:  Patient Active Problem List   Diagnosis Date Noted   Prolapsed internal hemorrhoids, grade 2 09/08/2022   Tinnitus of both ears 08/29/2022   Mixed hyperlipidemia 08/29/2022   Iron deficiency anemia 08/29/2022   CIN III (cervical intraepithelial neoplasia grade III) with severe dysplasia    Malignant neoplasm of cervix (HCC) 06/07/2022   Hemorrhoids 05/24/2022   Encounter for general adult medical examination with abnormal findings 04/27/2022   Depression, recurrent (HCC) 04/27/2022   GERD (gastroesophageal reflux disease)  11/09/2021   Essential hypertension 04/21/2020   Dysuria 04/21/2020   Mild persistent asthma 04/21/2020    Allergies:  Allergies  Allergen Reactions   Amoxicillin  Hives   Medications:  Current Outpatient Medications:    ADVAIR HFA 230-21 MCG/ACT inhaler, INHALE 2 PUFFS INTO THE LUNGS TWICE A DAY, Disp: 12  each, Rfl: 5   albuterol  (VENTOLIN  HFA) 108 (90 Base) MCG/ACT inhaler, TAKE 2 PUFFS BY MOUTH EVERY 6 HOURS AS NEEDED FOR WHEEZE OR SHORTNESS OF BREATH, Disp: 8.5 each, Rfl: 3   cholecalciferol (VITAMIN D3) 25 MCG (1000 UNIT) tablet, Take 1,000 Units by mouth daily., Disp: , Rfl:    cyanocobalamin (VITAMIN B12) 1000 MCG tablet, Take 1,000 mcg by mouth daily., Disp: , Rfl:    estradiol  (VIVELLE -DOT) 0.025 MG/24HR, PLACE 1 PATCH ONTO THE SKIN 2 TIMES A WEEK., Disp: 24 patch, Rfl: 5   ferrous sulfate  (FERROUSUL) 325 (65 FE) MG tablet, Take 1 tablet (325 mg total) by mouth every other day., Disp: 30 tablet, Rfl: 3   losartan  (COZAAR ) 50 MG tablet, TAKE 1 TABLET BY MOUTH EVERY DAY, Disp: 90 tablet, Rfl: 1   Magnesium 250 MG TABS, Take 1 tablet by mouth daily., Disp: , Rfl:    Misc. Devices MISC, Blood pressure cuff/device - 1. ICD10: I10, Disp: 1 each, Rfl: 0   nitrofurantoin , macrocrystal-monohydrate, (MACROBID ) 100 MG capsule, Take 1 capsule (100 mg total) by mouth 2 (two) times daily., Disp: 10 capsule, Rfl: 0   omeprazole  (PRILOSEC) 20 MG capsule, TAKE 1 CAPSULE BY MOUTH EVERY DAY (Patient taking differently: Take 20 mg by mouth daily.), Disp: 90 capsule, Rfl: 1   phenazopyridine (PYRIDIUM) 100 MG tablet, Take 1 tablet (100 mg total) by mouth 3 (three) times daily as needed for pain., Disp: 10 tablet, Rfl: 0   Probiotic Product (PROBIOTIC DAILY PO), Take 1 capsule by mouth daily., Disp: , Rfl:    promethazine -dextromethorphan (PROMETHAZINE -DM) 6.25-15 MG/5ML syrup, Take 5 mLs by mouth 4 (four) times daily as needed for cough., Disp: 118 mL, Rfl: 0   rosuvastatin  (CRESTOR ) 10 MG tablet, TAKE 1 TABLET BY MOUTH EVERY DAY, Disp: 90 tablet, Rfl: 1   sertraline  (ZOLOFT ) 25 MG tablet, TAKE 1 TABLET (25 MG TOTAL) BY MOUTH DAILY., Disp: 90 tablet, Rfl: 2   vitamin C (ASCORBIC ACID) 250 MG tablet, Take 250 mg by mouth daily., Disp: , Rfl:   Observations/Objective: Patient is well-developed, well-nourished in  no acute distress.  Resting comfortably at home.  Head is normocephalic, atraumatic.  No labored breathing.  Speech is clear and coherent with logical content.  Patient is alert and oriented at baseline.    Assessment and Plan: 1. Suspected UTI (Primary) - phenazopyridine (PYRIDIUM) 100 MG tablet; Take 1 tablet (100 mg total) by mouth 3 (three) times daily as needed for pain.  Dispense: 10 tablet; Refill: 0 - nitrofurantoin , macrocrystal-monohydrate, (MACROBID ) 100 MG capsule; Take 1 capsule (100 mg total) by mouth 2 (two) times daily.  Dispense: 10 capsule; Refill: 0  - Worsening symptoms.  - Will treat empirically with Macrobid  - May use Pyridium for bladder spasms - Continue to push fluids.  - Seek in person evaluation for urine culture if symptoms do not improve or if they worsen.    Follow Up Instructions: I discussed the assessment and treatment plan with the patient. The patient was provided an opportunity to ask questions and all were answered. The patient agreed with the plan and demonstrated an understanding of the instructions.  A copy  of instructions were sent to the patient via MyChart unless otherwise noted below.    The patient was advised to call back or seek an in-person evaluation if the symptoms worsen or if the condition fails to improve as anticipated.    Angelia Kelp, PA-C

## 2024-02-27 ENCOUNTER — Encounter: Payer: Self-pay | Admitting: Internal Medicine

## 2024-02-27 ENCOUNTER — Ambulatory Visit (INDEPENDENT_AMBULATORY_CARE_PROVIDER_SITE_OTHER): Admitting: Internal Medicine

## 2024-02-27 ENCOUNTER — Ambulatory Visit (HOSPITAL_COMMUNITY)
Admission: RE | Admit: 2024-02-27 | Discharge: 2024-02-27 | Disposition: A | Discharge status: Home / Self Care | Source: Ambulatory Visit | Attending: Internal Medicine | Admitting: Internal Medicine

## 2024-02-27 VITALS — BP 138/82 | HR 82 | Ht 60.63 in | Wt 146.2 lb

## 2024-02-27 DIAGNOSIS — F339 Major depressive disorder, recurrent, unspecified: Secondary | ICD-10-CM

## 2024-02-27 DIAGNOSIS — S82891A Other fracture of right lower leg, initial encounter for closed fracture: Secondary | ICD-10-CM | POA: Diagnosis not present

## 2024-02-27 DIAGNOSIS — Z8781 Personal history of (healed) traumatic fracture: Secondary | ICD-10-CM | POA: Diagnosis not present

## 2024-02-27 DIAGNOSIS — E782 Mixed hyperlipidemia: Secondary | ICD-10-CM

## 2024-02-27 DIAGNOSIS — M79671 Pain in right foot: Secondary | ICD-10-CM

## 2024-02-27 DIAGNOSIS — I10 Essential (primary) hypertension: Secondary | ICD-10-CM | POA: Diagnosis not present

## 2024-02-27 DIAGNOSIS — R7303 Prediabetes: Secondary | ICD-10-CM | POA: Diagnosis not present

## 2024-02-27 DIAGNOSIS — S93601A Unspecified sprain of right foot, initial encounter: Secondary | ICD-10-CM | POA: Diagnosis not present

## 2024-02-27 DIAGNOSIS — S92001A Unspecified fracture of right calcaneus, initial encounter for closed fracture: Secondary | ICD-10-CM | POA: Diagnosis not present

## 2024-02-27 MED ORDER — BUPROPION HCL ER (XL) 150 MG PO TB24: 150.0000 mg | ORAL_TABLET | Freq: Every day | ORAL | 3 refills | Status: DC

## 2024-02-27 MED ORDER — MELOXICAM 15 MG PO TABS: 15.0000 mg | ORAL_TABLET | Freq: Every day | ORAL | 0 refills | Status: DC

## 2024-02-27 NOTE — Patient Instructions (Addendum)
 Please take Meloxicam for foot pain.  Please continue to take medications as prescribed.  Please continue to follow DASH diet and perform moderate exercise/walking at least 150 mins/week.  Please get fasting blood tests done before the next visit.

## 2024-02-27 NOTE — Progress Notes (Unsigned)
 Established Patient Office Visit  Subjective:  Patient ID: Katherine [name], female    DOB: [birth year]  Age: 58 y.o. MRN: [id number]  CC:  Chief Complaint  Patient presents with   Foot Pain    Pt reports sx of right foot pain, has progressively worsened, ongoing for 2 weeks.    HPI Katherine [name] is a 58 y.o. female with past medical history of HTN, GERD, asthma and MDD who presents for f/u of her chronic medical conditions.  She was last seen in 01/24.  She reports right foot pain for the last 2 weeks, which is constant, progressive, sharp and radiating towards the toes.  Denies any recent injury or fall.  She has history of right ankle fracture and has hardware in place.  She has not seen podiatrist recently.  She has tried taking Tylenol  and ibuprofen  without much relief.  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  She is followed by Dr. Orvil Bland for it.  She denies any vaginal discharge or bleeding currently. She denies any fatigue or exertional dyspnea currently.  HTN: Her BP was still elevated today.  She is taking losartan  50 mg daily regularly.  She has history of elevated BP in previous office visits.  She currently denies any headache, dizziness, chest pain, dyspnea or palpitations.  GERD: She has stopped taking Omeprazole  for it.  Denies any nausea, vomiting or epigastric pain now.  MDD: She had noticed improvement with Zoloft  initially, but later stopped taking it as she felt weird with it.  She has spells of anxiety, anhedonia and insomnia.  Her right foot pain is aggravating her anxiety.  Asthma: She has albuterol  as needed for dyspnea or wheezing.  She also has advair inhaler.  Denies any recent worsening of dyspnea or wheezing.   Past Medical History:  Diagnosis Date   Allergy    Amoxicillin    Asthma, mild    followed by pcp   Chronic kidney disease 2008   surgery for Kidney blockage   Depression    GAD (generalized anxiety disorder)     GERD (gastroesophageal reflux disease)    History of hiatal hernia    History of obstruction of ureter 2005   s/p  left ureteral stent (per pt not due to stone,  resolved)   Malignant neoplasm cervix (HCC) 06/07/2022   [facility]   Pneumonia    Wears dentures    full upper   Wears glasses     Past Surgical History:  Procedure Laterality Date   ABDOMINAL HYSTERECTOMY  2023   CERVICAL CONIZATION W/BX N/A 06/30/2022   Procedure: CONIZATION CERVIX WITH BIOPSY; POST CONE ENDOCERVICAL CURRETTAGE;  Surgeon: Suzi Essex, MD;  Location: [facility];  Service: Gynecology;  Laterality: N/A;   CESAREAN SECTION  1993   COLONOSCOPY WITH PROPOFOL   06/21/2022   COLONOSCOPY WITH PROPOFOL  N/A 06/21/2022   Procedure: COLONOSCOPY WITH PROPOFOL ;  Surgeon: Vinetta Greening, DO;  Location: [facility];  Service: Endoscopy;  Laterality: N/A;  8:45 am ASA 2   CYSTOSCOPY WITH RETROGRADE PYELOGRAM, URETEROSCOPY AND STENT PLACEMENT Left 2005   for left upj obstructions (not due to stone)   ESOPHAGOGASTRODUODENOSCOPY (EGD) WITH PROPOFOL  N/A 06/21/2022   Procedure: ESOPHAGOGASTRODUODENOSCOPY (EGD) WITH PROPOFOL ;  Surgeon: Vinetta Greening, DO;  Location: [facility];  Service: Endoscopy;  Laterality: N/A;   FRACTURE SURGERY  2010   LAPAROSCOPIC TUBAL LIGATION Bilateral 1998   LYMPH NODE DISSECTION N/A 08/10/2022  Procedure: PELVIC LYMPH NODE DISSECTION;  Surgeon: Suzi Essex, MD;  Location: [facility];  Service: Gynecology;  Laterality: N/A;   ORIF ANKLE FRACTURE Right 2010   has retained hardware   POLYPECTOMY  06/21/2022   Procedure: POLYPECTOMY;  Surgeon: Vinetta Greening, DO;  Location: [facility];  Service: Endoscopy;;   RADICAL HYSTERECTOMY N/A 08/10/2022   Procedure: RADICAL HYSTERECTOMY, BILATERAL SALPINGO-OOPHORECTOMY;  Surgeon: Suzi Essex, MD;  Location: [facility];  Service: Gynecology;  Laterality: N/A;   TUBAL LIGATION  1998    Family History  Problem  Relation Age of Onset   Hypothyroidism Mother    Hypertension Mother    Anxiety disorder Mother    Arthritis Mother    Hypertension Father    Stroke Father    Hypothyroidism Sister    ADD / ADHD Sister    Asthma Sister    ADD / ADHD Sister    ADD / ADHD Sister    ADD / ADHD Brother    ADD / ADHD Brother    Colon cancer Brother    Depression Daughter    Depression Daughter    Colon polyps Neg Hx    Breast cancer Neg Hx    Ovarian cancer Neg Hx    Endometrial cancer Neg Hx    Pancreatic cancer Neg Hx    Prostate cancer Neg Hx     Social History   Socioeconomic History   Marital status: Divorced    Spouse name: Not on file   Number of children: 4   Years of education: Not on file   Highest education level: Associate degree: occupational, Scientist, product/process development, or vocational program  Occupational History   Not on file  Tobacco Use   Smoking status: Never   Smokeless tobacco: Never  Vaping Use   Vaping status: Never Used  Substance and Sexual Activity   Alcohol use: Not Currently    Comment: seldom   Drug use: Never   Sexual activity: Not Currently    Partners: Male    Birth control/protection: Surgical  Other Topics Concern   Not on file  Social History Narrative   Lives alone   4 children-one in [place], [place], [place], and [place]  in the [employer]    [place] children 2      Enjoy: spending time with family, children visit often       Diets: eats all food groups    Caffeine: 1 cup coffee twice week   Water: 6-8 cups      Wears seat belt    Does not use phone while driving-handfree   Smoke detectors at home   No weapons in the house    Social Drivers of Health   Financial Resource Strain: Medium Risk (02/26/2024)   Overall Financial Resource Strain (CARDIA)    Difficulty of Paying Living Expenses: Somewhat hard  Food Insecurity: Food Insecurity Present (02/26/2024)   Hunger Vital Sign    Worried About Running Out of Food in the Last Year: Sometimes true    Ran Out  of Food in the Last Year: Sometimes true  Transportation Needs: No Transportation Needs (02/26/2024)   PRAPARE - Administrator, Civil Service (Medical): No    Lack of Transportation (Non-Medical): No  Physical Activity: Inactive (02/26/2024)   Exercise Vital Sign    Days of Exercise per Week: 0 days    Minutes of Exercise per Session: 20 min  Stress: Stress Concern Present (02/26/2024)   [employer] of Occupational  Health - Occupational Stress Questionnaire    Feeling of Stress : Very much  Social Connections: Socially Isolated (02/26/2024)   Social Connection and Isolation Panel [NHANES]    Frequency of Communication with Friends and Family: Twice a week    Frequency of Social Gatherings with Friends and Family: Once a week    Attends Religious Services: Never    Database administrator or Organizations: No    Attends Banker Meetings: Never    Marital Status: Divorced  Catering manager Violence: Not At Risk (06/12/2023)   Humiliation, Afraid, Rape, and Kick questionnaire    Fear of Current or Ex-Partner: No    Emotionally Abused: No    Physically Abused: No    Sexually Abused: No    Outpatient Medications Prior to Visit  Medication Sig Dispense Refill   ADVAIR HFA 230-21 MCG/ACT inhaler INHALE 2 PUFFS INTO THE LUNGS TWICE A DAY 12 each 5   albuterol  (VENTOLIN  HFA) 108 (90 Base) MCG/ACT inhaler TAKE 2 PUFFS BY MOUTH EVERY 6 HOURS AS NEEDED FOR WHEEZE OR SHORTNESS OF BREATH 8.5 each 3   cholecalciferol (VITAMIN D3) 25 MCG (1000 UNIT) tablet Take 1,000 Units by mouth daily.     cyanocobalamin (VITAMIN B12) 1000 MCG tablet Take 1,000 mcg by mouth daily.     estradiol  (VIVELLE -DOT) 0.025 MG/24HR PLACE 1 PATCH ONTO THE SKIN 2 TIMES A WEEK. 24 patch 5   ferrous sulfate  (FERROUSUL) 325 (65 FE) MG tablet Take 1 tablet (325 mg total) by mouth every other day. 30 tablet 3   losartan  (COZAAR ) 50 MG tablet TAKE 1 TABLET BY MOUTH EVERY DAY 90 tablet 1   Magnesium 250  MG TABS Take 1 tablet by mouth daily.     Misc. Devices MISC Blood pressure cuff/device - 1. ICD10: I10 1 each 0   omeprazole  (PRILOSEC) 20 MG capsule TAKE 1 CAPSULE BY MOUTH EVERY DAY (Patient taking differently: Take 20 mg by mouth daily.) 90 capsule 1   Probiotic Product (PROBIOTIC DAILY PO) Take 1 capsule by mouth daily.     rosuvastatin  (CRESTOR ) 10 MG tablet TAKE 1 TABLET BY MOUTH EVERY DAY 90 tablet 1   vitamin C (ASCORBIC ACID) 250 MG tablet Take 250 mg by mouth daily.     nitrofurantoin , macrocrystal-monohydrate, (MACROBID ) 100 MG capsule Take 1 capsule (100 mg total) by mouth 2 (two) times daily. 10 capsule 0   phenazopyridine  (PYRIDIUM ) 100 MG tablet Take 1 tablet (100 mg total) by mouth 3 (three) times daily as needed for pain. 10 tablet 0   promethazine -dextromethorphan (PROMETHAZINE -DM) 6.25-15 MG/5ML syrup Take 5 mLs by mouth 4 (four) times daily as needed for cough. 118 mL 0   sertraline  (ZOLOFT ) 25 MG tablet TAKE 1 TABLET (25 MG TOTAL) BY MOUTH DAILY. 90 tablet 2   No facility-administered medications prior to visit.    Allergies  Allergen Reactions   Amoxicillin  Hives    ROS Review of Systems  Constitutional:  Negative for chills and fever.  HENT:  Positive for tinnitus. Negative for congestion, ear discharge, ear pain, hearing loss, sinus pressure, sinus pain and sore throat.   Eyes:  Negative for pain and discharge.  Respiratory:  Negative for cough and shortness of breath.   Cardiovascular:  Negative for chest pain and palpitations.  Gastrointestinal:  Negative for constipation, nausea and vomiting.  Endocrine: Negative for polydipsia and polyuria.  Genitourinary:  Negative for dysuria and hematuria.  Musculoskeletal:  Negative for neck pain and neck stiffness.  Right ankle and foot pain  Skin:  Negative for rash.  Neurological:  Negative for dizziness and weakness.  Psychiatric/Behavioral:  Negative for agitation and behavioral problems.        Objective:    Physical Exam Vitals reviewed.  Constitutional:      General: She is not in acute distress.    Appearance: She is not diaphoretic.  HENT:     Head: Normocephalic and atraumatic.     Mouth/Throat:     Mouth: Mucous membranes are moist.  Eyes:     General: No scleral icterus.    Extraocular Movements: Extraocular movements intact.  Cardiovascular:     Rate and Rhythm: Normal rate and regular rhythm.     Pulses:          Dorsalis pedis pulses are 2+ on the right side.     Heart sounds: Normal heart sounds. No murmur heard. Pulmonary:     Breath sounds: Normal breath sounds. No wheezing or rales.  Musculoskeletal:     Cervical back: Neck supple. No tenderness.     Right lower leg: No edema.     Left lower leg: No edema.     Right ankle: Swelling present. Decreased range of motion.     Right foot: Decreased range of motion.  Skin:    General: Skin is warm.     Findings: No rash.  Neurological:     General: No focal deficit present.     Mental Status: She is alert and oriented to person, place, and time.     Sensory: No sensory deficit.     Motor: No weakness.  Psychiatric:        Mood and Affect: Mood normal.        Behavior: Behavior normal.     BP 138/82 (BP Location: Right Arm)   Pulse 82   Ht 5' 0.63" (1.54 m)   Wt 146 lb 3.2 oz (66.3 kg)   SpO2 95%   BMI 27.96 kg/m  Wt Readings from Last 3 Encounters:  02/27/24 146 lb 3.2 oz (66.3 kg)  09/08/23 155 lb 3.2 oz (70.4 kg)  03/17/23 147 lb 6.4 oz (66.9 kg)    Lab Results  Component Value Date   TSH 2.350 04/27/2022   Lab Results  Component Value Date   WBC 7.3 10/25/2022   HGB 12.8 10/25/2022   HCT 38.6 10/25/2022   MCV 85 10/25/2022   PLT 295 10/25/2022   Lab Results  Component Value Date   NA 138 10/25/2022   K 5.1 10/25/2022   CO2 25 10/25/2022   GLUCOSE 91 10/25/2022   BUN 8 10/25/2022   CREATININE 0.69 10/25/2022   BILITOT 0.6 08/05/2022   ALKPHOS 122 08/05/2022   AST 28  08/05/2022   ALT 20 08/05/2022   PROT 7.8 08/05/2022   ALBUMIN  4.4 08/05/2022   CALCIUM  9.9 10/25/2022   ANIONGAP 5 08/12/2022   EGFR 101 10/25/2022   Lab Results  Component Value Date   CHOL 310 (H) 10/25/2022   Lab Results  Component Value Date   HDL 82 10/25/2022   Lab Results  Component Value Date   LDLCALC 201 (H) 10/25/2022   Lab Results  Component Value Date   TRIG 149 10/25/2022   Lab Results  Component Value Date   CHOLHDL 3.8 10/25/2022   Lab Results  Component Value Date   HGBA1C 5.5 04/27/2022      Assessment & Plan:   Problem List Items Addressed This  Visit       Cardiovascular and Mediastinum   Essential hypertension   BP Readings from Last 1 Encounters:  02/27/24 138/82   Uncontrolled with losartan  50 mg once daily, pain can also increase BP, would avoid changing regimen for now Check BMP Counseled for compliance with the medications Advised DASH diet and moderate exercise/walking, at least 150 mins/week      Relevant Orders   CBC with Differential/Platelet   CMP14+EGFR     Other   Depression, recurrent (HCC)   Uncontrolled Had to stop taking Zoloft  due to mood symptoms Switched to Wellbutrin 150 mg once daily Would discuss about BH therapy in the next visit, she denies for now      Relevant Medications   buPROPion (WELLBUTRIN XL) 150 MG 24 hr tablet   Mixed hyperlipidemia   Lipid profile reviewed from 01/24 Advised to restart Crestor  10 mg QD Advised to follow DASH diet for now      Relevant Orders   Lipid Profile   History of fracture of foot   Has a history of fracture of right foot, has hardware in place She has acute on chronic right foot pain Meloxicam 15 mg as needed for pain and swelling Referred to podiatry      Right foot pain - Primary   Since 02/13/24 Has history of fracture of right foot and hardware in place Refer to podiatry for further evaluation Meloxicam as needed for foot pain Advised to perform leg  elevation and cool compresses as tolerated      Relevant Medications   meloxicam (MOBIC) 15 MG tablet   Other Relevant Orders   Ambulatory referral to Podiatry   DG Foot Complete Right   Other Visit Diagnoses       Prediabetes       Relevant Orders   Hemoglobin A1c        Meds ordered this encounter  Medications   buPROPion (WELLBUTRIN XL) 150 MG 24 hr tablet    Sig: Take 1 tablet (150 mg total) by mouth daily.    Dispense:  30 tablet    Refill:  3   meloxicam (MOBIC) 15 MG tablet    Sig: Take 1 tablet (15 mg total) by mouth daily.    Dispense:  30 tablet    Refill:  0    Follow-up: Return in about 3 months (around 05/29/2024) for HTN.    Meldon Sport, MD

## 2024-02-28 DIAGNOSIS — H524 Presbyopia: Secondary | ICD-10-CM | POA: Diagnosis not present

## 2024-02-28 DIAGNOSIS — H52223 Regular astigmatism, bilateral: Secondary | ICD-10-CM | POA: Diagnosis not present

## 2024-02-28 DIAGNOSIS — H5203 Hypermetropia, bilateral: Secondary | ICD-10-CM | POA: Diagnosis not present

## 2024-02-28 DIAGNOSIS — H2513 Age-related nuclear cataract, bilateral: Secondary | ICD-10-CM | POA: Diagnosis not present

## 2024-02-28 DIAGNOSIS — M79671 Pain in right foot: Secondary | ICD-10-CM | POA: Insufficient documentation

## 2024-02-28 NOTE — Assessment & Plan Note (Signed)
 Since 02/13/24 Has history of fracture of right foot and hardware in place Refer to podiatry for further evaluation Meloxicam as needed for foot pain Advised to perform leg elevation and cool compresses as tolerated

## 2024-02-28 NOTE — Assessment & Plan Note (Signed)
 BP Readings from Last 1 Encounters:  02/27/24 138/82   Uncontrolled with losartan  50 mg once daily, pain can also increase BP, would avoid changing regimen for now Check BMP Counseled for compliance with the medications Advised DASH diet and moderate exercise/walking, at least 150 mins/week

## 2024-02-28 NOTE — Assessment & Plan Note (Signed)
 Uncontrolled Had to stop taking Zoloft  due to mood symptoms Switched to Wellbutrin 150 mg once daily Would discuss about BH therapy in the next visit, she denies for now

## 2024-02-28 NOTE — Assessment & Plan Note (Signed)
 Has a history of fracture of right foot, has hardware in place She has acute on chronic right foot pain Meloxicam  15 mg as needed for pain and swelling Referred to podiatry

## 2024-02-28 NOTE — Assessment & Plan Note (Addendum)
 Lipid profile reviewed from 01/24 Advised to restart Crestor  10 mg QD Advised to follow DASH diet for now

## 2024-02-29 DIAGNOSIS — H5203 Hypermetropia, bilateral: Secondary | ICD-10-CM | POA: Diagnosis not present

## 2024-02-29 DIAGNOSIS — H52223 Regular astigmatism, bilateral: Secondary | ICD-10-CM | POA: Diagnosis not present

## 2024-03-01 ENCOUNTER — Ambulatory Visit: Payer: Self-pay | Admitting: Internal Medicine

## 2024-03-01 ENCOUNTER — Other Ambulatory Visit: Payer: Self-pay | Admitting: Internal Medicine

## 2024-03-01 ENCOUNTER — Encounter: Payer: Self-pay | Admitting: Internal Medicine

## 2024-03-01 DIAGNOSIS — K2900 Acute gastritis without bleeding: Secondary | ICD-10-CM

## 2024-03-01 MED ORDER — OMEPRAZOLE 40 MG PO CPDR: 40.0000 mg | DELAYED_RELEASE_CAPSULE | Freq: Every day | ORAL | 3 refills | Status: AC

## 2024-03-02 ENCOUNTER — Telehealth: Admitting: Physician Assistant

## 2024-03-02 DIAGNOSIS — T39395A Adverse effect of other nonsteroidal anti-inflammatory drugs [NSAID], initial encounter: Secondary | ICD-10-CM

## 2024-03-02 DIAGNOSIS — M25571 Pain in right ankle and joints of right foot: Secondary | ICD-10-CM

## 2024-03-02 DIAGNOSIS — K2960 Other gastritis without bleeding: Secondary | ICD-10-CM | POA: Diagnosis not present

## 2024-03-02 MED ORDER — SUCRALFATE 1 G PO TABS: 1.0000 g | ORAL_TABLET | Freq: Three times a day (TID) | ORAL | 0 refills | Status: AC

## 2024-03-02 MED ORDER — DICLOFENAC SODIUM 1 % EX GEL
2.0000 g | Freq: Four times a day (QID) | CUTANEOUS | 0 refills | Status: AC
Start: 2024-03-02 — End: ?

## 2024-03-02 NOTE — Patient Instructions (Signed)
 Cindi Creamer, thank you for joining Angelia Kelp, PA-C for today's virtual visit.  While this provider is not your primary care provider (PCP), if your PCP is located in our provider database this encounter information will be shared with them immediately following your visit.   A [place] MyChart account gives you access to today's visit and all your visits, tests, and labs performed at [facility] " click here if you don't have a [facility] MyChart account or go to [email or web address]  Consent: (Patient) Katherine [name] provided verbal consent for this virtual visit at the beginning of the encounter.  Current Medications:  Current Outpatient Medications:    diclofenac Sodium (VOLTAREN) 1 % GEL, Apply 2 g topically 4 (four) times daily., Disp: 150 g, Rfl: 0   sucralfate (CARAFATE) 1 g tablet, Take 1 tablet (1 g total) by mouth 4 (four) times daily -  with meals and at bedtime., Disp: 40 tablet, Rfl: 0   ADVAIR HFA 230-21 MCG/ACT inhaler, INHALE 2 PUFFS INTO THE LUNGS TWICE A DAY, Disp: 12 each, Rfl: 5   albuterol  (VENTOLIN  HFA) 108 (90 Base) MCG/ACT inhaler, TAKE 2 PUFFS BY MOUTH EVERY 6 HOURS AS NEEDED FOR WHEEZE OR SHORTNESS OF BREATH, Disp: 8.5 each, Rfl: 3   buPROPion  (WELLBUTRIN  XL) 150 MG 24 hr tablet, Take 1 tablet (150 mg total) by mouth daily., Disp: 30 tablet, Rfl: 3   cholecalciferol (VITAMIN D3) 25 MCG (1000 UNIT) tablet, Take 1,000 Units by mouth daily., Disp: , Rfl:    cyanocobalamin (VITAMIN B12) 1000 MCG tablet, Take 1,000 mcg by mouth daily., Disp: , Rfl:    estradiol  (VIVELLE -DOT) 0.025 MG/24HR, PLACE 1 PATCH ONTO THE SKIN 2 TIMES A WEEK., Disp: 24 patch, Rfl: 5   losartan  (COZAAR ) 50 MG tablet, TAKE 1 TABLET BY MOUTH EVERY DAY, Disp: 90 tablet, Rfl: 1   Magnesium 250 MG TABS, Take 1 tablet by mouth daily., Disp: , Rfl:    Misc. Devices MISC, Blood pressure cuff/device - 1. ICD10: I10, Disp: 1 each, Rfl: 0   omeprazole  (PRILOSEC) 40 MG capsule, Take 1  capsule (40 mg total) by mouth daily., Disp: 30 capsule, Rfl: 3   Probiotic Product (PROBIOTIC DAILY PO), Take 1 capsule by mouth daily., Disp: , Rfl:    rosuvastatin  (CRESTOR ) 10 MG tablet, TAKE 1 TABLET BY MOUTH EVERY DAY, Disp: 90 tablet, Rfl: 1   vitamin C (ASCORBIC ACID) 250 MG tablet, Take 250 mg by mouth daily., Disp: , Rfl:    Medications ordered in this encounter:  Meds ordered this encounter  Medications   sucralfate (CARAFATE) 1 g tablet    Sig: Take 1 tablet (1 g total) by mouth 4 (four) times daily -  with meals and at bedtime.    Dispense:  40 tablet    Refill:  0    Supervising Provider:   LAMPTEY, PHILIP O [[id number]]   diclofenac Sodium (VOLTAREN) 1 % GEL    Sig: Apply 2 g topically 4 (four) times daily.    Dispense:  150 g    Refill:  0    Supervising Provider:   Corine Dice [[id number]]     *If you need refills on other medications prior to your next appointment, please contact your pharmacy*  Follow-Up: Call back or seek an in-person evaluation if the symptoms worsen or if the condition fails to improve as anticipated.  [facility] [phone]  Other Instructions  Gastritis, Adult Gastritis is inflammation of the  stomach. There are two kinds of gastritis: Acute gastritis. This kind develops suddenly. Chronic gastritis. This kind is much more common. It develops slowly and lasts for a long time. Gastritis happens when the lining of the stomach becomes weak or gets damaged. Without treatment, gastritis can lead to stomach bleeding and ulcers. What are the causes? This condition may be caused by: An infection. Drinking too much alcohol. Certain medicines. These include steroids, antibiotics, and some over-the-counter medicines, such as aspirin or ibuprofen . Having too much acid in the stomach. Having a disease of the stomach. Other causes may include: An allergic reaction. Some cancer treatments (radiation). Smoking cigarettes or the use  of products that contain nicotine or tobacco. In some cases, the cause of this condition is not known. What increases the risk? Having a disease of the intestines. Having a disease in which the body's immune system attacks the body (autoimmune disease), such as Crohn's disease. Using aspirin or ibuprofen  and other NSAIDs to treat other conditions, such as heart disease or chronic pain. Stress. What are the signs or symptoms? Symptoms of this condition include: Pain or a burning sensation in the upper abdomen. Nausea. Vomiting. An uncomfortable feeling of fullness after eating. Weight loss. Bad breath. Blood in your vomit or stool (feces). In some cases, there are no symptoms. How is this diagnosed? This condition may be diagnosed based on your medical history, a physical exam, and tests. Tests may include: Your medical history and a description of your symptoms. A physical exam. Tests. These can include: Blood tests. Stool tests. A test in which a thin, flexible instrument with a light and a camera is passed down the esophagus and into the stomach (upper endoscopy). A test in which a tissue sample is removed to look at it under a microscope (biopsy). How is this treated? This condition may be treated with medicines. The medicines that are used vary depending on the cause of the gastritis. If the condition is caused by a bacterial infection, you may be given antibiotic medicines. If the condition is caused by too much acid in the stomach, you may be given medicines called H2 blockers, proton pump inhibitors, or antacids. Treatment may also involve stopping the use of certain medicines such as aspirin or ibuprofen  and other NSAIDs. Follow these instructions at home: Medicines Take over-the-counter and prescription medicines only as told by your health care provider. If you were prescribed an antibiotic medicine, take it as told by your health care provider. Do not stop taking the  antibiotic even if you start to feel better. Alcohol use Do not drink alcohol if: Your health care provider tells you not to drink. You are pregnant, may be pregnant, or are planning to become pregnant. If you drink alcohol: Limit your use to: 0-1 drink a day for women. 0-2 drinks a day for men. Know how much alcohol is in your drink. In the [place] one drink equals one 12 oz bottle of beer (355 mL), one 5 oz glass of wine (148 mL), or one 1 oz glass of hard liquor (44 mL). General instructions  Eat small, frequent meals instead of large meals. Avoid foods and drinks that make your symptoms worse. Talk with your health care provider about ways to manage stress, such as getting regular exercise or practicing deep breathing, meditation, or yoga. Do not use any products that contain nicotine or tobacco. These products include cigarettes, chewing tobacco, and vaping devices, such as e-cigarettes. If you need help quitting, ask  your health care provider. Drink enough fluid to keep your urine pale yellow. Keep all follow-up visits. This is important. Contact a health care provider if: Your symptoms get worse. Your abdominal pain gets worse. Your symptoms return after treatment. You have a fever. Get help right away if: You vomit blood or a substance that looks like coffee grounds. You have black or dark red stools. You are unable to keep fluids down. These symptoms may represent a serious problem that is an emergency. Do not wait to see if the symptoms will go away. Get medical help right away. Call your local emergency services (911 in the [place] Do not drive yourself to the hospital. Summary Gastritis is inflammation of the lining of the stomach that can occur suddenly (acute) or develop slowly over time (chronic). This condition is diagnosed with a medical history, a physical exam, or tests. This condition may be treated with medicines to treat infection or medicines to reduce the amount  of acid in your stomach. Follow your health care provider's instructions about taking medicines, making changes to your diet, and knowing when to call for help. This information is not intended to replace advice given to you by your health care provider. Make sure you discuss any questions you have with your health care provider. Document Revised: 01/23/2021 Document Reviewed: 01/23/2021 Elsevier Patient Education  2024 Elsevier Inc.   If you have been instructed to have an in-person evaluation today at a local Urgent Care facility, please use the link below. It will take you to a list of all of our available [facility], including address, phone number and hours of operation. Please do not delay care.  [facility]  If you or a family member do not have a primary care provider, use the link below to schedule a visit and establish care. When you choose a [facility] primary care physician or advanced practice provider, you gain a long-term partner in health. Find a Primary Care Provider  Learn more about [facility]'s in-office and virtual care options: [facility] - Get Care Now

## 2024-03-02 NOTE — Progress Notes (Signed)
 Virtual Visit Consent   Katherine [name], you are scheduled for a virtual visit with a  provider today. Just as with appointments in the office, your consent must be obtained to participate. Your consent will be active for this visit and any virtual visit you may have with one of our providers in the next 365 days. If you have a MyChart account, a copy of this consent can be sent to you electronically.  As this is a virtual visit, video technology does not allow for your provider to perform a traditional examination. This may limit your provider's ability to fully assess your condition. If your provider identifies any concerns that need to be evaluated in person or the need to arrange testing (such as labs, EKG, etc.), we will make arrangements to do so. Although advances in technology are sophisticated, we cannot ensure that it will always work on either your end or our end. If the connection with a video visit is poor, the visit may have to be switched to a telephone visit. With either a video or telephone visit, we are not always able to ensure that we have a secure connection.  By engaging in this virtual visit, you consent to the provision of healthcare and authorize for your insurance to be billed (if applicable) for the services provided during this visit. Depending on your insurance coverage, you may receive a charge related to this service.  I need to obtain your verbal consent now. Are you willing to proceed with your visit today? Katherine [name] has provided verbal consent on 03/02/2024 for a virtual visit (video or telephone). Angelia Kelp, PA-C  Date: 03/02/2024 1:24 PM   Virtual Visit via Video Note   I, Angelia Kelp, connected with  Katherine [name]  ([id number], May 24, 1966) on 03/02/24 at  1:15 PM EDT by a video-enabled telemedicine application and verified that I am speaking with the correct person using two identifiers.  Location: Patient: Virtual Visit Location Patient:  Home Provider: Virtual Visit Location Provider: Home Office   I discussed the limitations of evaluation and management by telemedicine and the availability of in person appointments. The patient expressed understanding and agreed to proceed.    History of Present Illness: Katherine [name] is a 57 y.o. who identifies as a female who was assigned female at birth, and is being seen today for epigastric pain and bloating.  HPI: Abdominal Pain This is a recurrent problem. The current episode started in the past 7 days (3 days ago, had started Meloxicam  previously, took 2 tablets then pain started on Tuesday, had continued meloxicam  until yesterday when she felt it may be causing her stomach pain). The onset quality is sudden. The problem has been gradually worsening. The pain is located in the epigastric region. The pain is moderate. The quality of the pain is a sensation of fullness, sharp and burning. Associated symptoms include belching. She has tried proton pump inhibitors (omeprazole ) for the symptoms. The treatment provided no relief.     Problems:  Patient Active Problem List   Diagnosis Date Noted   Right foot pain 02/28/2024   History of fracture of foot 02/27/2024   Prolapsed internal hemorrhoids, grade 2 09/08/2022   Tinnitus of both ears 08/29/2022   Mixed hyperlipidemia 08/29/2022   Iron deficiency anemia 08/29/2022   CIN III (cervical intraepithelial neoplasia grade III) with severe dysplasia    Malignant neoplasm of cervix (HCC) 06/07/2022   Hemorrhoids 05/24/2022   Encounter for general adult medical examination  with abnormal findings 04/27/2022   Depression, recurrent (HCC) 04/27/2022   GERD (gastroesophageal reflux disease) 11/09/2021   Essential hypertension 04/21/2020   Dysuria 04/21/2020   Mild persistent asthma 04/21/2020    Allergies:  Allergies  Allergen Reactions   Amoxicillin  Hives   Medications:  Current Outpatient Medications:    diclofenac Sodium (VOLTAREN) 1  % GEL, Apply 2 g topically 4 (four) times daily., Disp: 150 g, Rfl: 0   sucralfate (CARAFATE) 1 g tablet, Take 1 tablet (1 g total) by mouth 4 (four) times daily -  with meals and at bedtime., Disp: 40 tablet, Rfl: 0   ADVAIR HFA 230-21 MCG/ACT inhaler, INHALE 2 PUFFS INTO THE LUNGS TWICE A DAY, Disp: 12 each, Rfl: 5   albuterol  (VENTOLIN  HFA) 108 (90 Base) MCG/ACT inhaler, TAKE 2 PUFFS BY MOUTH EVERY 6 HOURS AS NEEDED FOR WHEEZE OR SHORTNESS OF BREATH, Disp: 8.5 each, Rfl: 3   buPROPion  (WELLBUTRIN  XL) 150 MG 24 hr tablet, Take 1 tablet (150 mg total) by mouth daily., Disp: 30 tablet, Rfl: 3   cholecalciferol (VITAMIN D3) 25 MCG (1000 UNIT) tablet, Take 1,000 Units by mouth daily., Disp: , Rfl:    cyanocobalamin (VITAMIN B12) 1000 MCG tablet, Take 1,000 mcg by mouth daily., Disp: , Rfl:    estradiol  (VIVELLE -DOT) 0.025 MG/24HR, PLACE 1 PATCH ONTO THE SKIN 2 TIMES A WEEK., Disp: 24 patch, Rfl: 5   losartan  (COZAAR ) 50 MG tablet, TAKE 1 TABLET BY MOUTH EVERY DAY, Disp: 90 tablet, Rfl: 1   Magnesium 250 MG TABS, Take 1 tablet by mouth daily., Disp: , Rfl:    Misc. Devices MISC, Blood pressure cuff/device - 1. ICD10: I10, Disp: 1 each, Rfl: 0   omeprazole  (PRILOSEC) 40 MG capsule, Take 1 capsule (40 mg total) by mouth daily., Disp: 30 capsule, Rfl: 3   Probiotic Product (PROBIOTIC DAILY PO), Take 1 capsule by mouth daily., Disp: , Rfl:    rosuvastatin  (CRESTOR ) 10 MG tablet, TAKE 1 TABLET BY MOUTH EVERY DAY, Disp: 90 tablet, Rfl: 1   vitamin C (ASCORBIC ACID) 250 MG tablet, Take 250 mg by mouth daily., Disp: , Rfl:   Observations/Objective: Patient is well-developed, well-nourished in no acute distress.  Resting comfortably at home.  Head is normocephalic, atraumatic.  No labored breathing.  Speech is clear and coherent with logical content.  Patient is alert and oriented at baseline.    Assessment and Plan: 1. NSAID induced gastritis (Primary) - sucralfate (CARAFATE) 1 g tablet; Take 1  tablet (1 g total) by mouth 4 (four) times daily -  with meals and at bedtime.  Dispense: 40 tablet; Refill: 0  2. Acute right ankle pain - diclofenac Sodium (VOLTAREN) 1 % GEL; Apply 2 g topically 4 (four) times daily.  Dispense: 150 g; Refill: 0  - Worsening epigastric pain and bloating following recent NSAID use (Meloxicam ) - Continue Omeprazole  - Add Sucralfate - Avoid oral NSAIDs (Ibuprofen , Naproxen, Meloxicam ) - Limit spicy foods, acidic foods, caffeine, alcohol, carbonated beverages - Will trial Diclofenac gel, discussed some GI absorption, but felt to be less than 15%, so may tolerate better - Follow up in person if worsening or if symptoms fail to improve  Follow Up Instructions: I discussed the assessment and treatment plan with the patient. The patient was provided an opportunity to ask questions and all were answered. The patient agreed with the plan and demonstrated an understanding of the instructions.  A copy of instructions were sent to the patient via MyChart  unless otherwise noted below.    The patient was advised to call back or seek an in-person evaluation if the symptoms worsen or if the condition fails to improve as anticipated.    Angelia Kelp, PA-C

## 2024-03-08 ENCOUNTER — Encounter: Payer: Self-pay | Admitting: Gynecologic Oncology

## 2024-03-08 ENCOUNTER — Inpatient Hospital Stay: Payer: Medicare HMO | Attending: Gynecologic Oncology | Admitting: Gynecologic Oncology

## 2024-03-08 ENCOUNTER — Ambulatory Visit: Admitting: Podiatry

## 2024-03-08 VITALS — BP 138/82 | HR 74 | Temp 98.4°F | Resp 20 | Wt 146.6 lb

## 2024-03-08 DIAGNOSIS — R350 Frequency of micturition: Secondary | ICD-10-CM | POA: Diagnosis not present

## 2024-03-08 DIAGNOSIS — Z79890 Hormone replacement therapy (postmenopausal): Secondary | ICD-10-CM | POA: Diagnosis not present

## 2024-03-08 DIAGNOSIS — N904 Leukoplakia of vulva: Secondary | ICD-10-CM | POA: Insufficient documentation

## 2024-03-08 DIAGNOSIS — Z90710 Acquired absence of both cervix and uterus: Secondary | ICD-10-CM | POA: Diagnosis not present

## 2024-03-08 DIAGNOSIS — N393 Stress incontinence (female) (male): Secondary | ICD-10-CM

## 2024-03-08 DIAGNOSIS — C539 Malignant neoplasm of cervix uteri, unspecified: Secondary | ICD-10-CM

## 2024-03-08 DIAGNOSIS — N901 Moderate vulvar dysplasia: Secondary | ICD-10-CM | POA: Diagnosis not present

## 2024-03-08 DIAGNOSIS — Z8541 Personal history of malignant neoplasm of cervix uteri: Secondary | ICD-10-CM | POA: Diagnosis not present

## 2024-03-08 DIAGNOSIS — N9089 Other specified noninflammatory disorders of vulva and perineum: Secondary | ICD-10-CM

## 2024-03-08 NOTE — Progress Notes (Signed)
 Gynecologic Oncology Return Clinic Visit  03/08/24  Reason for Visit: surveillance  Treatment History: Oncology History Overview Note  Pap test was performed on 05/23/22 and showed squamous cell carcinoma, + HPV 16.   Malignant neoplasm of cervix (HCC)  06/07/2022 Initial Diagnosis   Malignant neoplasm of cervix (HCC)   06/17/2022 Initial Biopsy   Cervical biopsies, ECC  A. 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 CERVIX, 12:00, BIOPSY:  Microinvasive squamous cell carcinoma (0.25 mm depth) arising within  severe dysplasia to carcinoma in situ  Marked chronic and follicular cervicitis with squamous metaplasia   C. ENDOCERVICAL, CURRETAGE:  Numerous detached fragments of squamous cell carcinoma and severe  squamous dysplasia    06/29/2022 Imaging   PET: 1. Very low-grade activity in the vicinity of the cervix, maximum SUV 4.1, possibly reflecting the cervical malignancy. No findings of metastatic disease. 2. Small but hypermetabolic paratracheal, bilateral hilar/infrahilar, and subcarinal lymph nodes, at least one of which is partially calcified, raising some suspicion for granulomatous process such as sarcoidosis, although strictly speaking, lymphoma is not excluded. 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 3. Accentuated palatine tonsillar uptake bilaterally, probably physiologic but technically nonspecific. There is also a small but mildly hypermetabolic left level IIa lymph node with maximum SUV of 4.5. 4. Other imaging findings of potential clinical significance: Acute on chronic paranasal sinusitis. Mild cardiomegaly. Simple  appearing adnexal cysts warrant no further workup.   06/30/2022 Surgery   [facility], ECC  A. CERVIX, CONE:  - Invasive moderately differentiated squamous cell carcinoma arising in  background of extensive carcinoma in situ involving underlying  endocervical glands  - Invasive carcinoma is present in 6-9 and 9-12 o'clock quadrants while  12-3 o'clock and 3-6 o'clock quadrants show presence of carcinoma in  situ  - Carcinoma invades for a depth of at least 0.5 cm (6-9 o'clock  quadrant)  - The deep resection margin is widely positive for carcinoma (6-9  o'clock quadrant)  - All lateral mucosal margins are negative for invasive carcinoma  - Ecto- and endocervical margins in 12-3 o'clock, 3-6 o'clock and 6-9  o'clock quadrants are positive for carcinoma in situ; mucosal margins in  the 9-12 o'clock quadrants are negative   B. ENDOCERVIX, POST CONE, CURETTAGE:  - At least squamous cell carcinoma in situ  - Focal invasive carcinoma cannot be ruled out     Surgery   Type III radical hysterectomy with bilateral salpingoophorectomy and bilateral pelvic lymphadenectomy   On EUA, well-healed cervix, mildly firm suspected secondary to recent cold knife cone procedure.  On intra-abdominal entry, normal upper abdominal survey, no enlarged para-aortic lymph nodes on palpation.  Normal-appearing small and large bowel.  Uterus approximately 6 cm and normal in appearance.  Normal bilateral tubes and ovaries.  No obvious adenopathy with very little lymphatic tissue in the pelvic basins.     Pathologic Stage   A. LYMPH NODE, LEFT PELVIC, EXCISION: - Negative for carcinoma (0/1)  B. LYMPH NODE, RIGHT PELVIC, EXCISION: - Negative for carcinoma (0/1)  C. 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 POSTERIOR MARGIN: - Negative for dysplasia or carcinoma  E. ANTERIOR MARGIN: - Negative for dysplasia or carcinoma  COMMENT:  Multiple levels were examined in the sections from the cervix to rule out microinvasion, and no foci of invasion were identified. Appropriately controlled p16 immunohistochemical stain is positive in the tumor. Slides from part C were reviewed with Dr. Portia Brittle who agrees with the above interpretation.      Interval History: Doing well.  Denies any vaginal bleeding or discharge.  Denies abdominal or pelvic pain.  Patient continues to have some urinary frequency as well as intermittent stress incontinence, no recent changes.  Denies any change to bowel function.  Past Medical/Surgical History: Past Medical History:  Diagnosis Date   Allergy    Amoxicillin    Asthma, mild    followed by pcp   Chronic kidney disease 2008   surgery for Kidney blockage   Depression    GAD (generalized anxiety disorder)    GERD (gastroesophageal reflux disease)    History of hiatal hernia    History of obstruction of ureter 2005   s/p  left ureteral stent (per pt not due to stone,  resolved)   Malignant neoplasm cervix (HCC) 06/07/2022   SCC   Pneumonia    Wears dentures    full upper   Wears glasses     Past Surgical History:  Procedure Laterality Date   ABDOMINAL HYSTERECTOMY  2023   CERVICAL CONIZATION W/BX N/A 06/30/2022   Procedure: CONIZATION CERVIX WITH BIOPSY; POST CONE ENDOCERVICAL CURRETTAGE;  Surgeon: Suzi Essex, MD;  Location: [facility] [place];  Service: Gynecology;  Laterality: N/A;   CESAREAN SECTION  1993   COLONOSCOPY WITH PROPOFOL   06/21/2022    COLONOSCOPY WITH PROPOFOL  N/A 06/21/2022   Procedure: COLONOSCOPY WITH PROPOFOL ;  Surgeon: Vinetta Greening, DO;  [facility];  Service: Endoscopy;  Laterality: N/A;  8:45 am ASA 2   CYSTOSCOPY WITH RETROGRADE PYELOGRAM, URETEROSCOPY AND STENT PLACEMENT Left 2005   for left upj obstructions (not due to stone)   ESOPHAGOGASTRODUODENOSCOPY (EGD) WITH PROPOFOL  N/A 06/21/2022   Procedure: ESOPHAGOGASTRODUODENOSCOPY (EGD) WITH PROPOFOL ;  Surgeon: Vinetta Greening, DO;  Location: [facility];  Service: Endoscopy;  Laterality: N/A;   FRACTURE SURGERY  2010   LAPAROSCOPIC TUBAL LIGATION Bilateral 1998   LYMPH NODE DISSECTION N/A 08/10/2022   Procedure: PELVIC LYMPH NODE DISSECTION;  Surgeon: Suzi Essex, MD;  Location: [facility];  Service: Gynecology;  Laterality: N/A;   ORIF ANKLE FRACTURE Right 2010   has retained hardware   POLYPECTOMY  06/21/2022   Procedure: POLYPECTOMY;  Surgeon: Vinetta Greening, DO;  Location: [facility];  Service: Endoscopy;;   RADICAL HYSTERECTOMY N/A 08/10/2022   Procedure: RADICAL HYSTERECTOMY, BILATERAL SALPINGO-OOPHORECTOMY;  Surgeon: Suzi Essex, MD;  Location: [facility];  Service: Gynecology;  Laterality: N/A;   TUBAL LIGATION  1998    Family History  Problem Relation Age of Onset   Hypothyroidism Mother    Hypertension Mother    Anxiety disorder Mother    Arthritis Mother    Hypertension Father    Stroke Father    Hypothyroidism Sister    ADD / ADHD Sister  Asthma Sister    ADD / ADHD Sister    ADD / ADHD Sister    ADD / ADHD Brother    ADD / ADHD Brother    Colon cancer Brother    Depression Daughter    Depression Daughter    Colon polyps Neg Hx    Breast cancer Neg Hx    Ovarian cancer Neg Hx    Endometrial cancer Neg Hx    Pancreatic cancer Neg Hx    Prostate cancer Neg Hx     Social History   Socioeconomic History   Marital status: Divorced    Spouse name: Not on file   Number of children: 4   Years of  education: Not on file   Highest education level: Associate degree: occupational, Scientist, product/process development, or vocational program  Occupational History   Not on file  Tobacco Use   Smoking status: Never   Smokeless tobacco: Never  Vaping Use   Vaping status: Never Used  Substance and Sexual Activity   Alcohol use: Not Currently    Comment: seldom   Drug use: Never   Sexual activity: Not Currently    Partners: Male    Birth control/protection: Surgical  Other Topics Concern   Not on file  Social History Narrative   Lives alone   4 children-one in [place], [place], [place], and [place]  in the [employer]    [place] children 2      Enjoy: spending time with family, children visit often       Diets: eats all food groups    Caffeine: 1 cup coffee twice week   Water: 6-8 cups      Wears seat belt    Does not use phone while driving-handfree   Smoke detectors at home   No weapons in the house    Social Drivers of Health   Financial Resource Strain: Medium Risk (02/26/2024)   Overall Financial Resource Strain (CARDIA)    Difficulty of Paying Living Expenses: Somewhat hard  Food Insecurity: Food Insecurity Present (02/26/2024)   Hunger Vital Sign    Worried About Running Out of Food in the Last Year: Sometimes true    Ran Out of Food in the Last Year: Sometimes true  Transportation Needs: No Transportation Needs (02/26/2024)   PRAPARE - Administrator, Civil Service (Medical): No    Lack of Transportation (Non-Medical): No  Physical Activity: Inactive (02/26/2024)   Exercise Vital Sign    Days of Exercise per Week: 0 days    Minutes of Exercise per Session: 20 min  Stress: Stress Concern Present (02/26/2024)   [employer] of Occupational Health - Occupational Stress Questionnaire    Feeling of Stress : Very much  Social Connections: Socially Isolated (02/26/2024)   Social Connection and Isolation Panel [NHANES]    Frequency of Communication with Friends and Family: Twice a week     Frequency of Social Gatherings with Friends and Family: Once a week    Attends Religious Services: Never    Database administrator or Organizations: No    Attends Engineer, structural: Never    Marital Status: Divorced    Current Medications:  Current Outpatient Medications:    ADVAIR HFA 230-21 MCG/ACT inhaler, INHALE 2 PUFFS INTO THE LUNGS TWICE A DAY, Disp: 12 each, Rfl: 5   albuterol  (VENTOLIN  HFA) 108 (90 Base) MCG/ACT inhaler, TAKE 2 PUFFS BY MOUTH EVERY 6 HOURS AS NEEDED FOR WHEEZE OR SHORTNESS OF BREATH, Disp: 8.5 each,  Rfl: 3   buPROPion  (WELLBUTRIN  XL) 150 MG 24 hr tablet, Take 1 tablet (150 mg total) by mouth daily., Disp: 30 tablet, Rfl: 3   cholecalciferol (VITAMIN D3) 25 MCG (1000 UNIT) tablet, Take 1,000 Units by mouth daily., Disp: , Rfl:    cyanocobalamin (VITAMIN B12) 1000 MCG tablet, Take 1,000 mcg by mouth daily., Disp: , Rfl:    diclofenac Sodium (VOLTAREN) 1 % GEL, Apply 2 g topically 4 (four) times daily., Disp: 150 g, Rfl: 0   estradiol  (VIVELLE -DOT) 0.025 MG/24HR, PLACE 1 PATCH ONTO THE SKIN 2 TIMES A WEEK., Disp: 24 patch, Rfl: 5   losartan  (COZAAR ) 50 MG tablet, TAKE 1 TABLET BY MOUTH EVERY DAY, Disp: 90 tablet, Rfl: 1   Magnesium 250 MG TABS, Take 1 tablet by mouth daily., Disp: , Rfl:    Misc. Devices MISC, Blood pressure cuff/device - 1. ICD10: I10, Disp: 1 each, Rfl: 0   omeprazole  (PRILOSEC) 40 MG capsule, Take 1 capsule (40 mg total) by mouth daily., Disp: 30 capsule, Rfl: 3   Probiotic Product (PROBIOTIC DAILY PO), Take 1 capsule by mouth daily., Disp: , Rfl:    rosuvastatin  (CRESTOR ) 10 MG tablet, TAKE 1 TABLET BY MOUTH EVERY DAY, Disp: 90 tablet, Rfl: 1   sucralfate (CARAFATE) 1 g tablet, Take 1 tablet (1 g total) by mouth 4 (four) times daily -  with meals and at bedtime., Disp: 40 tablet, Rfl: 0   vitamin C (ASCORBIC ACID) 250 MG tablet, Take 250 mg by mouth daily., Disp: , Rfl:   Review of Systems: Denies appetite changes, fevers, chills,  fatigue, unexplained weight changes. Denies hearing loss, neck lumps or masses, mouth sores, ringing in ears or voice changes. Denies cough or wheezing.  Denies shortness of breath. Denies chest pain or palpitations. Denies leg swelling. Denies abdominal distention, pain, blood in stools, constipation, diarrhea, nausea, vomiting, or early satiety. Denies pain with intercourse, dysuria, frequency, hematuria or incontinence. Denies hot flashes, pelvic pain, vaginal bleeding or vaginal discharge.   Denies joint pain, back pain or muscle pain/cramps. Denies itching, rash, or wounds. Denies dizziness, headaches, numbness or seizures. Denies swollen lymph nodes or glands, denies easy bruising or bleeding. Denies anxiety, depression, confusion, or decreased concentration.  Physical Exam: BP (!) 147/73   Pulse 74   Temp 98.4 F (36.9 C)   Resp 20   Wt 146 lb 9.6 oz (66.5 kg)   SpO2 99%   BMI 28.04 kg/m  General: Alert, oriented, no acute distress. HEENT: Normocephalic, atraumatic, sclera anicteric. Chest: Unlabored breathing on room air.  No wheezes or rhonchi. Cardiovascular: Regular rate and rhythm, no murmurs or rubs appreciated. Abdomen: soft, nontender.  Normoactive bowel sounds.  No masses or hepatosplenomegaly appreciated.  Well-healed incision, remainder of Dermabond removed. Extremities: Grossly normal range of motion.  Warm, well perfused.  No edema bilaterally. Skin: No rashes or lesions noted. GU: Normal appearing external genitalia without erythema, excoriation.  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  Speculum exam reveals cuff intact, no masses, bleeding, or discharge noted.  On bimanual exam, the cuff is smooth, no masses or nodularity.  Rectovaginal exam confirms this.  Vulvar biopsy procedure Preoperative diagnosis: vulvar lesion, possible condyloma versus dysplasia Postoperative diagnosis: Same as above Physician:  Orvil Bland MD Estimated blood loss: Minimal Specimens: Vulvar biopsy Procedure: After speculum and bimanual exam was performed, lesion on the vulva was identified.  Discussed possible biopsy and the patient gave verbal consent.  She was already in dorsolithotomy position.  The area to be biopsy was cleansed with Betadine  x3.  2 cc of 2% lidocaine  was then injected for local anesthesia.  After adequate time was given, a 3 mm punch biopsy was performed.  This was placed in formalin.  Silver nitrate was used to achieve hemostasis.  Overall the patient tolerated the procedure well.   Laboratory & Radiologic Studies: Pap 09/2023: NIML, HR HPV negative  Assessment & Plan: Katherine [name] is a 58 y.o. 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 Surgery 08/2022. No LVI.    Doing well, NED on exam.    Patient started on low-dose estrogen replacement in 2024 given bothersome menopausal symptoms.  She has had resolution of all symptoms since.    Area of leukoplakia, that the patient says has been present since around the time of her surgery was biopsied today.  I will contact her with these results.  If low-grade dysplasia, we talked about the possible topical treatment (TCA) in clinic.  If high-grade dysplasia, we will discuss excision versus laser ablation in the OR.  Discussed referral to pelvic floor physical therapy given urinary symptoms.  Patient amenable, referral placed today.   Per NCCN surveillance recommendations, given low risk disease, we will plan on visits every 6 months.  We discussed performing Pap and HPV testing every year.  We will transition to yearly visits when she is 2-3 years from her surgery.  We reviewed signs and symptoms that would be concerning for cancer recurrence, and I stressed the importance of calling if she develops any of these between visits.  25 minutes of total  time was spent for this patient encounter, including preparation, face-to-face counseling with the patient and coordination of care, and documentation of the encounter.  Wiley Hanger, MD  Division of Gynecologic Oncology  Department of Obstetrics and Gynecology  [facility]

## 2024-03-08 NOTE — Patient Instructions (Signed)
 It was good to see you today.  I do not see or feel any evidence of cancer recurrence on your exam.  I will see you for follow-up in 6 months.  We will contact you next week with your biopsy results.  As always, if you develop any new and concerning symptoms before your next visit, please call to see me sooner.

## 2024-03-11 ENCOUNTER — Encounter: Payer: Self-pay | Admitting: Podiatry

## 2024-03-11 ENCOUNTER — Ambulatory Visit (INDEPENDENT_AMBULATORY_CARE_PROVIDER_SITE_OTHER)

## 2024-03-11 ENCOUNTER — Ambulatory Visit: Admitting: Podiatry

## 2024-03-11 VITALS — Ht 60.0 in | Wt 146.0 lb

## 2024-03-11 DIAGNOSIS — M722 Plantar fascial fibromatosis: Secondary | ICD-10-CM

## 2024-03-11 DIAGNOSIS — G8928 Other chronic postprocedural pain: Secondary | ICD-10-CM | POA: Diagnosis not present

## 2024-03-11 DIAGNOSIS — M7751 Other enthesopathy of right foot: Secondary | ICD-10-CM

## 2024-03-11 MED ORDER — TRIAMCINOLONE ACETONIDE 10 MG/ML IJ SUSP
10.0000 mg | Freq: Once | INTRAMUSCULAR | Status: AC
  Administered 2024-03-11: 10 mg via INTRA_ARTICULAR

## 2024-03-12 ENCOUNTER — Ambulatory Visit: Payer: Self-pay | Admitting: Gynecologic Oncology

## 2024-03-12 ENCOUNTER — Encounter: Payer: Self-pay | Admitting: Gynecologic Oncology

## 2024-03-12 LAB — SURGICAL PATHOLOGY

## 2024-03-12 NOTE — Progress Notes (Signed)
 I called the patient to discuss recent biopsy results. No answer. Left voicemail letting her know I will call again tomorrow.

## 2024-03-13 ENCOUNTER — Telehealth: Payer: Self-pay | Admitting: Gynecologic Oncology

## 2024-03-13 NOTE — Telephone Encounter (Signed)
 Patient again.  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  Discussed recommended treatment, either with excision or laser ablation in the OR.  Patient amenable.  I will somebody from my office to reach out tomorrow or Friday with possible dates.  Wiley Hanger MD Gynecologic Oncology

## 2024-03-13 NOTE — Progress Notes (Signed)
 Subjective:   Patient ID: Katherine [name], female   DOB: 58 y.o.   MRN: [id number]   HPI Patient presents stating she has had a lot of pain in her right ankle and foot secondary to having had surgery on her ankle 15 years ago.  Concerned that the fixation is a big part of the problem.  Patient is not currently smoking and likes to be active   Review of Systems  All other systems reviewed and are negative.       Objective:  Physical Exam Vitals and nursing note reviewed.  Constitutional:      Appearance: She is well-developed.  Pulmonary:     Effort: Pulmonary effort is normal.  Musculoskeletal:        General: Normal range of motion.  Skin:    General: Skin is warm.  Neurological:     Mental Status: She is alert.     Neurovascular status intact muscle strength found to be adequate range of motion adequate mild loss of motion of the ankle joint right with quite a bit of discomfort in the right sinus tarsi fluid buildup noted around the area.  Fixation but it appears to be more in the medial ankle around the tibia than the fibula and pain is somewhat diffuse around that area.  Good digital perfusion well     Assessment:  Difficult to say whether the fixation is a big problem or whether this is inflammatory with sinus tarsitis ankle pain possible arthritis     Plan:  H&P reviewed and patient is gena go back to the original surgeon at [facility] that did the surgery due to him having all the notes and all information.  I did go ahead today I did sterile prep I injected the sinus tarsi 3 mg Kenalog 5 mg Xylocaine  applied sterile dressing and instructed on activities  X-rays indicate significant fixation in the right ankle mostly in the tibia with no indications of advanced arthritis

## 2024-03-18 ENCOUNTER — Telehealth: Payer: Self-pay

## 2024-03-18 NOTE — Telephone Encounter (Signed)
 Per Katherine Grieves NP I reached out to Katherine [name] with potential surgery dates with Dr.Tucker.   Pt has chosen August 7th for surgery. She is aware our office will contact her if any appointments need to be made with our office prior to that date. She voiced an understanding.

## 2024-03-21 ENCOUNTER — Other Ambulatory Visit: Payer: Self-pay | Admitting: Internal Medicine

## 2024-03-21 DIAGNOSIS — F339 Major depressive disorder, recurrent, unspecified: Secondary | ICD-10-CM

## 2024-03-25 ENCOUNTER — Other Ambulatory Visit: Payer: Self-pay | Admitting: Internal Medicine

## 2024-03-25 DIAGNOSIS — M79671 Pain in right foot: Secondary | ICD-10-CM

## 2024-05-03 ENCOUNTER — Encounter (HOSPITAL_COMMUNITY)
Admission: RE | Admit: 2024-05-03 | Discharge: 2024-05-03 | Disposition: A | Discharge status: Home / Self Care | Source: Ambulatory Visit | Attending: Gynecologic Oncology | Admitting: Gynecologic Oncology

## 2024-05-03 ENCOUNTER — Other Ambulatory Visit: Payer: Self-pay

## 2024-05-03 ENCOUNTER — Encounter (HOSPITAL_COMMUNITY): Payer: Self-pay | Admitting: *Deleted

## 2024-05-03 VITALS — BP 140/68 | HR 86 | Temp 98.1°F | Resp 16 | Ht 60.0 in | Wt 144.0 lb

## 2024-05-03 DIAGNOSIS — Z01818 Encounter for other preprocedural examination: Secondary | ICD-10-CM | POA: Insufficient documentation

## 2024-05-03 DIAGNOSIS — I10 Essential (primary) hypertension: Secondary | ICD-10-CM | POA: Diagnosis not present

## 2024-05-03 HISTORY — DX: Unspecified osteoarthritis, unspecified site: M19.90

## 2024-05-03 LAB — CBC
HCT: 41.5 % (ref 36.0–46.0)
Hemoglobin: 13.5 g/dL (ref 12.0–15.0)
MCH: 29.3 pg (ref 26.0–34.0)
MCHC: 32.5 g/dL (ref 30.0–36.0)
MCV: 90 fL (ref 80.0–100.0)
Platelets: 282 K/uL (ref 150–400)
RBC: 4.61 MIL/uL (ref 3.87–5.11)
RDW: 13.1 % (ref 11.5–15.5)
WBC: 10.5 K/uL (ref 4.0–10.5)
nRBC: 0 % (ref 0.0–0.2)

## 2024-05-03 LAB — BASIC METABOLIC PANEL WITH GFR
Anion gap: 12 (ref 5–15)
BUN: 19 mg/dL (ref 6–20)
CO2: 23 mmol/L (ref 22–32)
Calcium: 9.6 mg/dL (ref 8.9–10.3)
Chloride: 106 mmol/L (ref 98–111)
Creatinine, Ser: 0.73 mg/dL (ref 0.44–1.00)
GFR, Estimated: >60 mL/min (ref >60)
Glucose, Bld: 122 mg/dL — ABNORMAL HIGH (ref 70–99)
Potassium: 3.9 mmol/L (ref 3.5–5.1)
Sodium: 141 mmol/L (ref 135–145)

## 2024-05-03 NOTE — Patient Instructions (Signed)
 SURGICAL WAITING ROOM VISITATION Patients having surgery or a procedure may have no more than 2 support people in the waiting area - these visitors may rotate in the visitor waiting room.   If the patient needs to stay at the hospital during part of their recovery, the visitor guidelines for inpatient rooms apply.  PRE-OP VISITATION  Pre-op nurse will coordinate an appropriate time for 1 support person to accompany the patient in pre-op.  This support person may not rotate.  This visitor will be contacted when the time is appropriate for the visitor to come back in the pre-op area.  Please refer to the [facility] website for the visitor guidelines for Inpatients (after your surgery is over and you are in a regular room).  You are not required to quarantine at this time prior to your surgery. However, you must do this: Hand Hygiene often Do NOT share personal items Notify your provider if you are in close contact with someone who has COVID or you develop fever 100.4 or greater, new onset of sneezing, cough, sore throat, shortness of breath or body aches.  If you test positive for Covid or have been in contact with anyone that has tested positive in the last 10 days please notify you surgeon.    Your procedure is scheduled on:  Thursday  05-09-2024  Report to [facility] Main Entrance: [address] entrance where the [employer] is available.   Report to admitting at:  05:15   AM  Call this number if you have any questions or problems the morning of surgery [phone]  FOLLOW ANY ADDITIONAL PRE OP INSTRUCTIONS YOU RECEIVED FROM YOUR SURGEON'S OFFICE!!!  Do not eat food after Midnight the night prior to your surgery/procedure.  After Midnight you may have the following liquids until   04:30 AM DAY OF SURGERY  Clear Liquid Diet Water Black Coffee (sugar ok, NO MILK/CREAM OR CREAMERS)  Tea (sugar ok, NO MILK/CREAM OR CREAMERS) regular and decaf                             Plain  Jell-O  with no fruit (NO RED)                                           Fruit ices (not with fruit pulp, NO RED)                                     Popsicles (NO RED)                                                                  Juice: NO CITRUS JUICES: only apple, WHITE grape, WHITE cranberry Sports drinks like Gatorade or Powerade (NO RED)                Oral Hygiene is also important to reduce your risk of infection.        Remember - BRUSH YOUR TEETH THE MORNING OF SURGERY WITH YOUR REGULAR TOOTHPASTE  Do NOT smoke after  Midnight the night before surgery.  STOP TAKING all Vitamins, Herbs and supplements 1 week before your surgery.   Take ONLY these medicines the morning of surgery with A SIP OF WATER: Bupropion . And you may use your inhalers, Please bring your Albuterol  inhaler with you on the day of surgery.                    You may not have any metal on your body including hair pins, jewelry, and body piercing  Do not wear make-up, lotions, powders, perfumes  or deodorant  Do not wear nail polish including gel and S&S, artificial / acrylic nails, or any other type of covering on natural nails including finger and toenails. If you have artificial nails, gel coating, etc., that needs to be removed by a nail salon, Please have this removed prior to surgery. Not doing so may mean that your surgery could be cancelled or delayed if the Surgeon or anesthesia staff feels like they are unable to monitor you safely.   Do not shave 48 hours prior to surgery to avoid nicks in your skin which may contribute to postoperative infections.    Contacts, Hearing Aids, dentures or bridgework may not be worn into surgery. DENTURES WILL BE REMOVED PRIOR TO SURGERY PLEASE DO NOT APPLY Poly grip OR ADHESIVES!!!  You may bring a small overnight bag with you on the day of surgery, only pack items that are not valuable. [facility] IS NOT RESPONSIBLE   FOR VALUABLES THAT ARE LOST OR STOLEN.    Patients discharged on the day of surgery will not be allowed to drive home.  Someone NEEDS to stay with you for the first 24 hours after anesthesia.  Do not bring your home medications to the hospital. The Pharmacy will dispense medications listed on your medication list to you during your admission in the Hospital.   Please read over the following fact sheets you were given: IF YOU HAVE QUESTIONS ABOUT YOUR PRE-OP INSTRUCTIONS, PLEASE CALL [phone]   [facility] - Preparing for Surgery Before surgery, you can play an important role.  Because skin is not sterile, your skin needs to be as free of germs as possible.  You can reduce the number of germs on your skin by washing with CHG (chlorahexidine gluconate) soap before surgery.  CHG is an antiseptic cleaner which kills germs and bonds with the skin to continue killing germs even after washing. Please DO NOT use if you have an allergy to CHG or antibacterial soaps.  If your skin becomes reddened/irritated stop using the CHG and inform your nurse when you arrive at Short Stay. Do not shave (including legs and underarms) for at least 48 hours prior to the first CHG shower.  You may shave your face/neck.  Please follow these instructions carefully:  1.  Shower with CHG Soap the night before surgery and the  morning of surgery.  2.  If you choose to wash your hair, wash your hair first as usual with your normal  shampoo.  3.  After you shampoo, rinse your hair and body thoroughly to remove the shampoo.                             4.  Use CHG as you would any other liquid soap.  You can apply chg directly to the skin and wash.  Gently with a scrungie or clean washcloth.  5.  Apply the CHG Soap to your body ONLY FROM THE NECK DOWN.   Do not use on face/ open                           Wound or open sores. Avoid contact with eyes, ears mouth and genitals (private parts).                       Wash face,  Genitals (private parts) with your normal  soap.             6.  Wash thoroughly, paying special attention to the area where your  surgery  will be performed.  7.  Thoroughly rinse your body with warm water from the neck down.  8.  DO NOT shower/wash with your normal soap after using and rinsing off the CHG Soap.            9.  Pat yourself dry with a clean towel.            10.  Wear clean pajamas.            11.  Place clean sheets on your bed the night of your first shower and do not  sleep with pets.  ON THE DAY OF SURGERY : Do not apply any lotions/deodorants the morning of surgery.  Please wear clean clothes to the hospital/surgery center.     FAILURE TO FOLLOW THESE INSTRUCTIONS MAY RESULT IN THE CANCELLATION OF YOUR SURGERY  PATIENT SIGNATURE_________________________________  NURSE SIGNATURE__________________________________  ________________________________________________________________________

## 2024-05-03 NOTE — Progress Notes (Signed)
 COVID Vaccine received:  [x]  No []  Yes Date of any COVID positive Test in last 90 days:  none   PCP - Suzzane Blanch, MD Cardiologist - none   Chest x-ray - 10-01-2020  2v  Epic EKG - 08-04-2022    Repeated 05-03-2024 Stress Test - n/a ECHO - n/a Cardiac Cath - n/a   Pacemaker/ICD device     [x]  N/A Spinal Cord Stimulator:[x]  No []  Yes      History of Sleep Apnea? [x]  No []  Yes   CPAP used?- [x]  No []  Yes     Does the patient monitor blood sugar? []  No []  Yes  [x]  N/A   Blood Thinner Instructions: none  Aspirin Instructions: none   ERAS Protocol Ordered: []  No  [x]  Yes PRE-SURGERY []  ENSURE  []  G2  [x]  No Drink Ordered   Comments: Has full upper dentures,     Activity level: Patient can climb a flight of stairs without difficulty; [x]  No CP  [x]  No SOB. Patient can / can not perform ADLs without assistance.    Anesthesia review: HTN, Asthma, Depression, GERD,    Patient denies shortness of breath, fever, cough and chest pain at PAT appointment.   Patient verbalized understanding and agreement to the Pre-Surgical Instructions that were given to them at this PAT appointment. Patient was also educated of the need to review these PAT instructions again prior to her surgery.I reviewed the appropriate phone numbers to call if they have any and questions or concerns.

## 2024-05-04 ENCOUNTER — Other Ambulatory Visit: Payer: Self-pay | Admitting: Internal Medicine

## 2024-05-04 DIAGNOSIS — I10 Essential (primary) hypertension: Secondary | ICD-10-CM

## 2024-05-04 DIAGNOSIS — E782 Mixed hyperlipidemia: Secondary | ICD-10-CM

## 2024-05-06 ENCOUNTER — Telehealth: Payer: Self-pay | Admitting: Oncology

## 2024-05-06 NOTE — Telephone Encounter (Signed)
 Called Katherine [name] and advised that Dr. Viktoria has an opening for surgery on 05/08/24 if she would like to move her procedure up. [name] declined because her son already had arranged to have the day off on Thursday.

## 2024-05-08 ENCOUNTER — Other Ambulatory Visit: Payer: Self-pay | Admitting: Gynecologic Oncology

## 2024-05-08 ENCOUNTER — Telehealth: Payer: Self-pay | Admitting: *Deleted

## 2024-05-08 DIAGNOSIS — N9089 Other specified noninflammatory disorders of vulva and perineum: Secondary | ICD-10-CM

## 2024-05-08 MED ORDER — TRAMADOL HCL 50 MG PO TABS: 50.0000 mg | ORAL_TABLET | Freq: Four times a day (QID) | ORAL | 0 refills | Status: DC | PRN

## 2024-05-08 NOTE — Telephone Encounter (Signed)
 Telephone call to check on pre-operative status.  Patient compliant with pre-operative instructions.  Reinforced nothing to eat after midnight. Clear liquids until 0415. Patient to arrive at 0515.  No questions or concerns voiced.  Instructed to call for any needs.

## 2024-05-08 NOTE — Anesthesia Preprocedure Evaluation (Addendum)
 Anesthesia Evaluation  Patient identified by MRN, date of birth, ID band Patient awake    Reviewed: Allergy & Precautions, NPO status , Patient's Chart, lab work & pertinent test results  History of Anesthesia Complications Negative for: history of anesthetic complications  Airway Mallampati: II  TM Distance: >3 FB Neck ROM: Full    Dental  (+) Edentulous Upper, Edentulous Lower   Pulmonary neg shortness of breath, asthma , neg sleep apnea, pneumonia, neg recent URI   Pulmonary exam normal breath sounds clear to auscultation       Cardiovascular hypertension, (-) angina (-) CAD, (-) Past MI, (-) Cardiac Stents and (-) CABG  Rhythm:Regular Rate:Normal     Neuro/Psych  PSYCHIATRIC DISORDERS Anxiety Depression    negative neurological ROS     GI/Hepatic Neg liver ROS, hiatal hernia,GERD  Medicated,,  Endo/Other  negative endocrine ROS    Renal/GU Renal disease     Musculoskeletal  (+) Arthritis ,    Abdominal  (+) - obese  Peds  Hematology negative hematology ROS (+) Blood dyscrasia, anemia   Anesthesia Other Findings   Reproductive/Obstetrics                              Anesthesia Physical Anesthesia Plan  ASA: 3  Anesthesia Plan: General   Post-op Pain Management: Tylenol  PO (pre-op)* and Celebrex  PO (pre-op)*   Induction: Intravenous  PONV Risk Score and Plan: 3 and Ondansetron , Dexamethasone  and Scopolamine  patch - Pre-op  Airway Management Planned: Oral ETT and LMA  Additional Equipment: None  Intra-op Plan:   Post-operative Plan: Extubation in OR  Informed Consent: I have reviewed the patients History and Physical, chart, labs and discussed the procedure including the risks, benefits and alternatives for the proposed anesthesia with the patient or authorized representative who has indicated his/her understanding and acceptance.     Dental advisory given  Plan  Discussed with: CRNA and Anesthesiologist  Anesthesia Plan Comments:         Anesthesia Quick Evaluation

## 2024-05-08 NOTE — Discharge Instructions (Addendum)
 AFTER SURGERY INSTRUCTIONS  Return to work:  variable based on occupation  Activity: 1. Be up and out of the bed during the day.  Take a nap if needed.  You may walk up steps but be careful and use the hand rail.  Stair climbing will tire you more than you think, you may need to stop part way and rest.   2. No lifting or straining for 4 weeks over 10 pounds. No pushing, pulling, straining for 4 weeks.  3. No driving for minimum 24 hours after surgery.  Do not drive if you are taking narcotic pain medicine and make sure that your reaction time has returned.   4. You can shower as soon as the next day after surgery. Shower daily. No tub baths or submerging your body in water until cleared by your surgeon. If you have the soap that was given to you by pre-surgical testing that was used before surgery, you do not need to use it afterwards because this can irritate your incisions.   5. No sexual activity and nothing in the vagina for 4-6 weeks.  6. You may experience vaginal spotting and discharge after surgery.  The spotting is normal but if you experience heavy bleeding, call our office.  7. Take Tylenol  or ibuprofen  (or mobic , do not take together) first for pain if you are able to take these medications and only use narcotic pain medication for severe pain not relieved by the Tylenol  or Ibuprofen .  Monitor your Tylenol  intake to a max of 4,000 mg in a 24 hour period. You can alternate these medications after surgery.  Diet: 1. Low sodium Heart Healthy Diet is recommended but you are cleared to resume your normal (before surgery) diet after your procedure.  2. It is safe to use a laxative, such as Miralax or Colace, if you have difficulty moving your bowels. You have been prescribed Sennakot at bedtime every evening to keep bowel movements regular and to prevent constipation.    Wound Care: 1. Keep clean and dry.  Shower daily.  Reasons to call the Doctor: Fever - Oral temperature greater  than 100.4 degrees Fahrenheit Foul-smelling vaginal discharge Difficulty urinating Nausea and vomiting Increased pain at the site of the incision that is unrelieved with pain medicine. Difficulty breathing with or without chest pain New calf pain especially if only on one side Sudden, continuing increased vaginal bleeding with or without clots.   Contacts: For questions or concerns you should contact:  Dr. Comer Dollar at [phone]  Eleanor Epps, NP at [phone]  After Hours: call [phone] and have the GYN Oncologist paged/contacted (after 5 pm or on the weekends).  Messages sent via mychart are for non-urgent matters and are not responded to after hours so for urgent needs, please call the after hours number.

## 2024-05-09 ENCOUNTER — Ambulatory Visit (HOSPITAL_COMMUNITY): Payer: Self-pay | Admitting: Anesthesiology

## 2024-05-09 ENCOUNTER — Ambulatory Visit (HOSPITAL_BASED_OUTPATIENT_CLINIC_OR_DEPARTMENT_OTHER): Payer: Self-pay | Admitting: Anesthesiology

## 2024-05-09 ENCOUNTER — Other Ambulatory Visit: Payer: Self-pay

## 2024-05-09 ENCOUNTER — Encounter (HOSPITAL_COMMUNITY): Payer: Self-pay | Admitting: Gynecologic Oncology

## 2024-05-09 ENCOUNTER — Ambulatory Visit (HOSPITAL_COMMUNITY)
Admission: RE | Admit: 2024-05-09 | Discharge: 2024-05-09 | Disposition: A | Discharge status: Home / Self Care | Attending: Gynecologic Oncology | Admitting: Gynecologic Oncology

## 2024-05-09 ENCOUNTER — Encounter (HOSPITAL_COMMUNITY)
Admission: RE | Disposition: A | Discharge status: Home / Self Care | Payer: Self-pay | Source: Home / Self Care | Attending: Gynecologic Oncology

## 2024-05-09 DIAGNOSIS — J45909 Unspecified asthma, uncomplicated: Secondary | ICD-10-CM | POA: Diagnosis not present

## 2024-05-09 DIAGNOSIS — N891 Moderate vaginal dysplasia: Secondary | ICD-10-CM | POA: Diagnosis not present

## 2024-05-09 DIAGNOSIS — E782 Mixed hyperlipidemia: Secondary | ICD-10-CM | POA: Diagnosis not present

## 2024-05-09 DIAGNOSIS — I10 Essential (primary) hypertension: Secondary | ICD-10-CM

## 2024-05-09 DIAGNOSIS — Z79899 Other long term (current) drug therapy: Secondary | ICD-10-CM | POA: Insufficient documentation

## 2024-05-09 DIAGNOSIS — F418 Other specified anxiety disorders: Secondary | ICD-10-CM

## 2024-05-09 DIAGNOSIS — F411 Generalized anxiety disorder: Secondary | ICD-10-CM | POA: Insufficient documentation

## 2024-05-09 DIAGNOSIS — K449 Diaphragmatic hernia without obstruction or gangrene: Secondary | ICD-10-CM | POA: Insufficient documentation

## 2024-05-09 DIAGNOSIS — N189 Chronic kidney disease, unspecified: Secondary | ICD-10-CM | POA: Insufficient documentation

## 2024-05-09 DIAGNOSIS — F32A Depression, unspecified: Secondary | ICD-10-CM | POA: Insufficient documentation

## 2024-05-09 DIAGNOSIS — Z5986 Financial insecurity: Secondary | ICD-10-CM | POA: Diagnosis not present

## 2024-05-09 DIAGNOSIS — K219 Gastro-esophageal reflux disease without esophagitis: Secondary | ICD-10-CM | POA: Diagnosis not present

## 2024-05-09 DIAGNOSIS — N901 Moderate vulvar dysplasia: Secondary | ICD-10-CM | POA: Insufficient documentation

## 2024-05-09 DIAGNOSIS — I129 Hypertensive chronic kidney disease with stage 1 through stage 4 chronic kidney disease, or unspecified chronic kidney disease: Secondary | ICD-10-CM | POA: Insufficient documentation

## 2024-05-09 HISTORY — PX: VULVECTOMY: SHX1086

## 2024-05-09 HISTORY — PX: EXAM UNDER ANESTHESIA, PELVIC: SHX7461

## 2024-05-09 SURGERY — EXAM UNDER ANESTHESIA, PELVIC
Anesthesia: General | Site: Vulva

## 2024-05-09 MED ORDER — LACTATED RINGERS IV SOLN: INTRAVENOUS | Status: DC

## 2024-05-09 MED ORDER — BUPIVACAINE LIPOSOME 1.3 % IJ SUSP
INTRAMUSCULAR | Status: DC | PRN
  Administered 2024-05-09: 20 mL

## 2024-05-09 MED ORDER — LIDOCAINE HCL (CARDIAC) PF 100 MG/5ML IV SOSY
PREFILLED_SYRINGE | INTRAVENOUS | Status: DC | PRN
  Administered 2024-05-09: 100 mg via INTRAVENOUS

## 2024-05-09 MED ORDER — PHENYLEPHRINE 80 MCG/ML (10ML) SYRINGE FOR IV PUSH (FOR BLOOD PRESSURE SUPPORT)
PREFILLED_SYRINGE | INTRAVENOUS | Status: DC | PRN
  Administered 2024-05-09: 40 ug via INTRAVENOUS
  Administered 2024-05-09: 80 ug via INTRAVENOUS

## 2024-05-09 MED ORDER — CHLORHEXIDINE GLUCONATE 0.12 % MT SOLN
15.0000 mL | Freq: Once | OROMUCOSAL | Status: AC
  Administered 2024-05-09: 15 mL via OROMUCOSAL

## 2024-05-09 MED ORDER — SCOPOLAMINE 1 MG/3DAYS TD PT72
1.0000 | MEDICATED_PATCH | TRANSDERMAL | Status: DC
  Administered 2024-05-09: 1.5 mg via TRANSDERMAL
  Filled 2024-05-09: qty 1

## 2024-05-09 MED ORDER — MONSELS FERRIC SUBSULFATE EX SOLN
CUTANEOUS | Status: AC
  Filled 2024-05-09: qty 8

## 2024-05-09 MED ORDER — BUPIVACAINE HCL (PF) 0.25 % IJ SOLN
INTRAMUSCULAR | Status: DC | PRN
  Administered 2024-05-09: 10 mL

## 2024-05-09 MED ORDER — ACETAMINOPHEN 500 MG PO TABS
1000.0000 mg | ORAL_TABLET | ORAL | Status: AC
  Administered 2024-05-09: 1000 mg via ORAL
  Filled 2024-05-09: qty 2

## 2024-05-09 MED ORDER — DEXAMETHASONE SODIUM PHOSPHATE 10 MG/ML IJ SOLN
4.0000 mg | INTRAMUSCULAR | Status: AC
  Administered 2024-05-09: 5 mg via INTRAVENOUS

## 2024-05-09 MED ORDER — EPHEDRINE SULFATE-NACL 50-0.9 MG/10ML-% IV SOSY
PREFILLED_SYRINGE | INTRAVENOUS | Status: DC | PRN
  Administered 2024-05-09: 5 mg via INTRAVENOUS

## 2024-05-09 MED ORDER — LIDOCAINE HCL (PF) 2 % IJ SOLN
INTRAMUSCULAR | Status: AC
  Filled 2024-05-09: qty 5

## 2024-05-09 MED ORDER — ONDANSETRON HCL 4 MG/2ML IJ SOLN
INTRAMUSCULAR | Status: AC
Start: 2024-05-09 — End: 2024-05-09
  Filled 2024-05-09: qty 2

## 2024-05-09 MED ORDER — SENNA 8.6 MG PO TABS: 1.0000 | ORAL_TABLET | Freq: Every evening | ORAL | 1 refills | Status: DC | PRN

## 2024-05-09 MED ORDER — OXYCODONE HCL 5 MG PO TABS: 5.0000 mg | ORAL_TABLET | Freq: Once | ORAL | Status: DC | PRN

## 2024-05-09 MED ORDER — BUPIVACAINE HCL (PF) 0.25 % IJ SOLN
INTRAMUSCULAR | Status: AC
  Filled 2024-05-09: qty 30

## 2024-05-09 MED ORDER — 0.9 % SODIUM CHLORIDE (POUR BTL) OPTIME
TOPICAL | Status: DC | PRN
Start: 2024-05-09 — End: 2024-05-09
  Administered 2024-05-09: 1000 mL

## 2024-05-09 MED ORDER — ORAL CARE MOUTH RINSE: 15.0000 mL | Freq: Once | OROMUCOSAL | Status: AC

## 2024-05-09 MED ORDER — FENTANYL CITRATE PF 50 MCG/ML IJ SOSY: 25.0000 ug | PREFILLED_SYRINGE | INTRAMUSCULAR | Status: DC | PRN

## 2024-05-09 MED ORDER — FENTANYL CITRATE (PF) 100 MCG/2ML IJ SOLN
INTRAMUSCULAR | Status: AC
  Filled 2024-05-09: qty 2

## 2024-05-09 MED ORDER — DEXAMETHASONE SODIUM PHOSPHATE 10 MG/ML IJ SOLN
INTRAMUSCULAR | Status: AC
  Filled 2024-05-09: qty 1

## 2024-05-09 MED ORDER — MEPERIDINE HCL 25 MG/ML IJ SOLN: 6.2500 mg | INTRAMUSCULAR | Status: DC | PRN

## 2024-05-09 MED ORDER — ONDANSETRON HCL 4 MG/2ML IJ SOLN: 4.0000 mg | Freq: Once | INTRAMUSCULAR | Status: DC | PRN

## 2024-05-09 MED ORDER — OXYCODONE HCL 5 MG/5ML PO SOLN: 5.0000 mg | Freq: Once | ORAL | Status: DC | PRN

## 2024-05-09 MED ORDER — MIDAZOLAM HCL 5 MG/5ML IJ SOLN
INTRAMUSCULAR | Status: DC | PRN
  Administered 2024-05-09: 2 mg via INTRAVENOUS

## 2024-05-09 MED ORDER — LIDOCAINE HCL (PF) 1 % IJ SOLN
INTRAMUSCULAR | Status: AC
  Filled 2024-05-09: qty 30

## 2024-05-09 MED ORDER — ONDANSETRON HCL 4 MG/2ML IJ SOLN
INTRAMUSCULAR | Status: DC | PRN
  Administered 2024-05-09: 4 mg via INTRAVENOUS

## 2024-05-09 MED ORDER — FENTANYL CITRATE (PF) 100 MCG/2ML IJ SOLN
INTRAMUSCULAR | Status: DC | PRN
  Administered 2024-05-09: 25 ug via INTRAVENOUS
  Administered 2024-05-09: 50 ug via INTRAVENOUS
  Administered 2024-05-09: 25 ug via INTRAVENOUS

## 2024-05-09 MED ORDER — PROPOFOL 10 MG/ML IV BOLUS
INTRAVENOUS | Status: DC | PRN
Start: 2024-05-09 — End: 2024-05-09
  Administered 2024-05-09: 100 mg via INTRAVENOUS

## 2024-05-09 MED ORDER — BUPIVACAINE LIPOSOME 1.3 % IJ SUSP
INTRAMUSCULAR | Status: AC
  Filled 2024-05-09: qty 20

## 2024-05-09 MED ORDER — MIDAZOLAM HCL 2 MG/2ML IJ SOLN
INTRAMUSCULAR | Status: AC
  Filled 2024-05-09: qty 2

## 2024-05-09 MED ORDER — ACETIC ACID 5 % SOLN
Status: DC | PRN
  Administered 2024-05-09: 1 via TOPICAL

## 2024-05-09 MED ORDER — CELECOXIB 200 MG PO CAPS
200.0000 mg | ORAL_CAPSULE | Freq: Once | ORAL | Status: AC
  Administered 2024-05-09: 200 mg via ORAL
  Filled 2024-05-09: qty 1

## 2024-05-09 MED ORDER — ACETIC ACID 5 % SOLN
Status: AC
  Filled 2024-05-09: qty 12

## 2024-05-09 MED ORDER — EPHEDRINE 5 MG/ML INJ
INTRAVENOUS | Status: AC
  Filled 2024-05-09: qty 5

## 2024-05-09 MED ORDER — PHENYLEPHRINE 80 MCG/ML (10ML) SYRINGE FOR IV PUSH (FOR BLOOD PRESSURE SUPPORT)
PREFILLED_SYRINGE | INTRAVENOUS | Status: AC
  Filled 2024-05-09: qty 10

## 2024-05-09 MED ORDER — IODINE STRONG (LUGOLS) 5 % PO SOLN
ORAL | Status: AC
  Filled 2024-05-09: qty 1

## 2024-05-09 MED ORDER — PROPOFOL 10 MG/ML IV BOLUS
INTRAVENOUS | Status: AC
  Filled 2024-05-09: qty 20

## 2024-05-09 SURGICAL SUPPLY — 48 items
BAG COUNTER SPONGE SURGICOUNT (BAG) IMPLANT
BLADE SURG 15 STRL LF DISP TIS (BLADE) IMPLANT
DEPRESSOR TONGUE 6 IN STERILE (GAUZE/BANDAGES/DRESSINGS) ×2 IMPLANT
DRAPE SHEET LG 3/4 BI-LAMINATE (DRAPES) ×2 IMPLANT
DRAPE SURG IRRIG POUCH 19X23 (DRAPES) ×2 IMPLANT
DRAPE UNDERBUTTOCKS STRL (DISPOSABLE) IMPLANT
DRSG TELFA 3X8 NADH STRL (GAUZE/BANDAGES/DRESSINGS) ×2 IMPLANT
ELECT BALL LEEP 3MM BLK (ELECTRODE) IMPLANT
GAUZE 4X4 16PLY ~~LOC~~+RFID DBL (SPONGE) ×2 IMPLANT
GAUZE SPONGE 4X4 12PLY STRL (GAUZE/BANDAGES/DRESSINGS) ×2 IMPLANT
GLOVE BIO SURGEON STRL SZ 6 (GLOVE) ×4 IMPLANT
GOWN STRL REUS W/ TWL LRG LVL3 (GOWN DISPOSABLE) ×2 IMPLANT
KIT TURNOVER KIT A (KITS) ×2 IMPLANT
NDL HYPO 21X1.5 SAFETY (NEEDLE) ×2 IMPLANT
NDL HYPO 22X1.5 SAFETY MO (MISCELLANEOUS) IMPLANT
NDL HYPO 25X1 1.5 SAFETY (NEEDLE) ×2 IMPLANT
NDL SPNL 22GX7 QUINCKE BK (NEEDLE) IMPLANT
NEEDLE HYPO 21X1.5 SAFETY (NEEDLE) ×2 IMPLANT
NEEDLE HYPO 22X1.5 SAFETY MO (MISCELLANEOUS) IMPLANT
NEEDLE HYPO 25X1 1.5 SAFETY (NEEDLE) ×2 IMPLANT
NEEDLE SPNL 22GX7 QUINCKE BK (NEEDLE) IMPLANT
NS IRRIG 1000ML POUR BTL (IV SOLUTION) ×2 IMPLANT
PACK LITHOTOMY IV (CUSTOM PROCEDURE TRAY) IMPLANT
PACK PERINEAL COLD (PAD) ×2 IMPLANT
PACK VAGINAL WOMENS (CUSTOM PROCEDURE TRAY) ×2 IMPLANT
PAD PREP 24X48 CUFFED NSTRL (MISCELLANEOUS) ×2 IMPLANT
PUNCH BIOPSY 3 (MISCELLANEOUS) IMPLANT
SCOPETTES 8 STERILE (MISCELLANEOUS) ×2 IMPLANT
SOL PREP POV-IOD 4OZ 10% (MISCELLANEOUS) ×2 IMPLANT
SUT VIC AB 0 CT1 27XBRD ANTBC (SUTURE) ×2 IMPLANT
SUT VIC AB 0 CT1 36 (SUTURE) IMPLANT
SUT VIC AB 2-0 SH 27X BRD (SUTURE) ×2 IMPLANT
SUT VIC AB 2-0 SH 27XBRD (SUTURE) IMPLANT
SUT VIC AB 3-0 PS2 18XBRD (SUTURE) IMPLANT
SUT VIC AB 3-0 SH 27X BRD (SUTURE) IMPLANT
SUT VIC AB 3-0 SH 27XBRD (SUTURE) ×2 IMPLANT
SUT VIC AB 4-0 PS2 18 (SUTURE) IMPLANT
SUT VIC AB 4-0 PS2 27 (SUTURE) ×2 IMPLANT
SUT VICRYL 0 UR6 27IN ABS (SUTURE) IMPLANT
SYR BULB IRRIG 60ML STRL (SYRINGE) ×2 IMPLANT
SYR CONTROL 10ML LL (SYRINGE) ×2 IMPLANT
TOWEL OR 17X26 10 PK STRL BLUE (TOWEL DISPOSABLE) ×2 IMPLANT
TRAY FOLEY MTR SLVR 16FR STAT (SET/KITS/TRAYS/PACK) IMPLANT
TUBING CONNECTING 10 (TUBING) ×2 IMPLANT
UNDERPAD 30X36 HEAVY ABSORB (UNDERPADS AND DIAPERS) ×2 IMPLANT
VACUUM HOSE 7/8X10 W/ WAND (MISCELLANEOUS) IMPLANT
WATER STERILE IRR 500ML POUR (IV SOLUTION) ×2 IMPLANT
YANKAUER SUCT BULB TIP NO VENT (SUCTIONS) ×2 IMPLANT

## 2024-05-09 NOTE — H&P (Signed)
 Gynecologic Oncology H&P  05/09/24  Treatment History: Oncology History Overview Note  Pap test was performed on 05/23/22 and showed squamous cell carcinoma, + HPV 16.   Malignant neoplasm of cervix (HCC)  06/07/2022 Initial Diagnosis   Malignant neoplasm of cervix (HCC)   06/17/2022 Initial Biopsy   Cervical biopsies, ECC  A. 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 CERVIX, 12:00, BIOPSY:  Microinvasive squamous cell carcinoma (0.25 mm depth) arising within  severe dysplasia to carcinoma in situ  Marked chronic and follicular cervicitis with squamous metaplasia   C. ENDOCERVICAL, CURRETAGE:  Numerous detached fragments of squamous cell carcinoma and severe  squamous dysplasia    06/29/2022 Imaging   PET: 1. Very low-grade activity in the vicinity of the cervix, maximum SUV 4.1, possibly reflecting the cervical malignancy. No findings of metastatic disease. 2. Small but hypermetabolic paratracheal, bilateral hilar/infrahilar, and subcarinal lymph nodes, at least one of which is partially calcified, raising some suspicion for granulomatous process such as sarcoidosis, although strictly speaking, lymphoma is not excluded. 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 3. Accentuated palatine tonsillar uptake bilaterally, probably physiologic but technically nonspecific. There is also a small but mildly hypermetabolic left level IIa lymph node with maximum SUV of 4.5. 4. Other imaging findings of potential clinical significance: Acute on chronic paranasal sinusitis. Mild cardiomegaly. Simple appearing adnexal cysts warrant no further workup.    06/30/2022 Surgery   [facility], ECC  A. CERVIX, CONE:  - Invasive moderately differentiated squamous cell carcinoma arising in  background of extensive carcinoma in situ involving underlying  endocervical glands  - Invasive carcinoma is present in 6-9 and 9-12 o'clock quadrants while  12-3 o'clock and 3-6 o'clock quadrants show presence of carcinoma in  situ  - Carcinoma invades for a depth of at least 0.5 cm (6-9 o'clock  quadrant)  - The deep resection margin is widely positive for carcinoma (6-9  o'clock quadrant)  - All lateral mucosal margins are negative for invasive carcinoma  - Ecto- and endocervical margins in 12-3 o'clock, 3-6 o'clock and 6-9  o'clock quadrants are positive for carcinoma in situ; mucosal margins in  the 9-12 o'clock quadrants are negative   B. ENDOCERVIX, POST CONE, CURETTAGE:  - At least squamous cell carcinoma in situ  - Focal invasive carcinoma cannot be ruled out     Surgery   Type III radical hysterectomy with bilateral salpingoophorectomy and bilateral pelvic lymphadenectomy   On EUA, well-healed cervix, mildly firm suspected secondary to recent cold knife cone procedure.  On intra-abdominal entry, normal upper abdominal survey, no enlarged para-aortic lymph nodes on palpation.  Normal-appearing small and large bowel.  Uterus approximately 6 cm and normal in appearance.  Normal bilateral tubes and ovaries.  No obvious adenopathy with very little lymphatic tissue in the pelvic basins.     Pathologic Stage   A. LYMPH NODE, LEFT PELVIC, EXCISION: - Negative for carcinoma (0/1)  B. LYMPH NODE, RIGHT PELVIC, EXCISION: - Negative for carcinoma (0/1)  C. 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 POSTERIOR MARGIN: - Negative for dysplasia or carcinoma  E. ANTERIOR MARGIN: - Negative for dysplasia or carcinoma  COMMENT:  Multiple levels were examined in the sections from the cervix to rule out microinvasion, and no foci of invasion were identified. Appropriately controlled p16 immunohistochemical stain is positive in the tumor. Slides from part C were reviewed with Dr. Belvie who agrees with the above interpretation.      Interval History: Doing well.  Past Medical/Surgical History: Past Medical History:  Diagnosis Date   Allergy    Amoxicillin    Arthritis    Asthma, mild    followed by pcp   Chronic kidney disease 2008   surgery for Kidney blockage   Depression    GAD (generalized anxiety disorder)    GERD (gastroesophageal reflux disease)    History of hiatal hernia    History of obstruction of ureter 2005   s/p  left ureteral stent (per pt not due to stone,  resolved)   Malignant neoplasm cervix (HCC) 06/07/2022   SCC   Pneumonia    Wears dentures    full upper   Wears glasses     Past Surgical History:  Procedure Laterality Date   ABDOMINAL HYSTERECTOMY  2023   CERVICAL CONIZATION W/BX N/A 06/30/2022   Procedure: CONIZATION CERVIX WITH BIOPSY; POST CONE ENDOCERVICAL CURRETTAGE;  Surgeon: Viktoria Comer SAUNDERS, MD;  Location: [facility];  Service: Gynecology;  Laterality: N/A;   CESAREAN SECTION  1993   COLONOSCOPY WITH PROPOFOL   06/21/2022   COLONOSCOPY WITH PROPOFOL  N/A 06/21/2022   Procedure: COLONOSCOPY WITH PROPOFOL ;  Surgeon: Cindie Carlin POUR, DO;  Location[facility];  Service: Endoscopy;  Laterality: N/A;  8:45 am ASA 2   CYSTOSCOPY WITH RETROGRADE PYELOGRAM, URETEROSCOPY AND STENT PLACEMENT Left  2005   for left upj obstructions (not due to stone)   ESOPHAGOGASTRODUODENOSCOPY (EGD) WITH PROPOFOL  N/A 06/21/2022   Procedure: ESOPHAGOGASTRODUODENOSCOPY (EGD) WITH PROPOFOL ;  Surgeon: Cindie Carlin POUR, DO;  Location: [facility];  Service: Endoscopy;  Laterality: N/A;   LAPAROSCOPIC TUBAL LIGATION Bilateral 1998   LYMPH NODE DISSECTION N/A 08/10/2022   Procedure: PELVIC LYMPH NODE DISSECTION;  Surgeon: Viktoria Comer SAUNDERS, MD;  Location: [facility];  Service: Gynecology;  Laterality: N/A;   ORIF ANKLE FRACTURE Right 2010   has retained hardware   POLYPECTOMY  06/21/2022   Procedure: POLYPECTOMY;  Surgeon: Cindie Carlin POUR, DO;  Location: [facility];  Service: Endoscopy;;   RADICAL HYSTERECTOMY N/A 08/10/2022   Procedure: RADICAL HYSTERECTOMY, BILATERAL SALPINGO-OOPHORECTOMY;  Surgeon: Viktoria Comer SAUNDERS, MD;  Location: [facility];  Service: Gynecology;  Laterality: N/A;   TUBAL LIGATION  1998    Family History  Problem Relation Age of Onset   Hypothyroidism Mother    Hypertension Mother    Anxiety disorder Mother    Arthritis Mother    Hypertension Father    Stroke Father    Hypothyroidism Sister    ADD / ADHD Sister    Asthma Sister    ADD / ADHD Sister    ADD / ADHD Sister    ADD / ADHD Brother    ADD / ADHD Brother    Colon cancer Brother    Depression Daughter  Depression Daughter    Colon polyps Neg Hx    Breast cancer Neg Hx    Ovarian cancer Neg Hx    Endometrial cancer Neg Hx    Pancreatic cancer Neg Hx    Prostate cancer Neg Hx     Social History   Socioeconomic History   Marital status: Divorced    Spouse name: Not on file   Number of children: 4   Years of education: Not on file   Highest education level: Associate degree: occupational, Scientist, product/process development, or vocational program  Occupational History   Not on file  Tobacco Use   Smoking status: Never   Smokeless tobacco: Never  Vaping Use   Vaping status: Never Used  Substance and Sexual Activity    Alcohol use: Not Currently    Comment: seldom   Drug use: Never   Sexual activity: Not Currently    Partners: Male    Birth control/protection: Surgical  Other Topics Concern   Not on file  Social History Narrative   Lives alone   4 children-one in [place], [place], [place], and [place]  in the [employer]    [place] children 2      Enjoy: spending time with family, children visit often       Diets: eats all food groups    Caffeine: 1 cup coffee twice week   Water: 6-8 cups      Wears seat belt    Does not use phone while driving-handfree   Smoke detectors at home   No weapons in the house    Social Drivers of Health   Financial Resource Strain: Medium Risk (02/26/2024)   Overall Financial Resource Strain (CARDIA)    Difficulty of Paying Living Expenses: Somewhat hard  Food Insecurity: Food Insecurity Present (02/26/2024)   Hunger Vital Sign    Worried About Running Out of Food in the Last Year: Sometimes true    Ran Out of Food in the Last Year: Sometimes true  Transportation Needs: No Transportation Needs (02/26/2024)   PRAPARE - Administrator, Civil Service (Medical): No    Lack of Transportation (Non-Medical): No  Physical Activity: Inactive (02/26/2024)   Exercise Vital Sign    Days of Exercise per Week: 0 days    Minutes of Exercise per Session: 20 min  Stress: Stress Concern Present (02/26/2024)   [employer] of Occupational Health - Occupational Stress Questionnaire    Feeling of Stress : Very much  Social Connections: Socially Isolated (02/26/2024)   Social Connection and Isolation Panel    Frequency of Communication with Friends and Family: Twice a week    Frequency of Social Gatherings with Friends and Family: Once a week    Attends Religious Services: Never    Database administrator or Organizations: No    Attends Engineer, structural: Never    Marital Status: Divorced    Current Medications:  Current Facility-Administered Medications:     dexamethasone  (DECADRON ) injection 4 mg, 4 mg, Intravenous, On Call to OR, Cross, Eleanor D, NP   lactated ringers  infusion, , Intravenous, Continuous, Woodrum, Chelsey L, MD, Last Rate: 10 mL/hr at 05/09/24 0638, New Bag at 05/09/24 9361   scopolamine  (TRANSDERM-SCOP) 1 MG/3DAYS 1.5 mg, 1 patch, Transdermal, Q72H, Mallory Manus, MD, 1.5 mg at 05/09/24 0630  Review of Systems: Denies appetite changes, fevers, chills, fatigue, unexplained weight changes. Denies hearing loss, neck lumps or masses, mouth sores, ringing in ears or voice changes. Denies cough or wheezing.  Denies shortness of breath. Denies chest pain or palpitations. Denies leg swelling. Denies abdominal distention, pain, blood in stools, constipation, diarrhea, nausea, vomiting, or early satiety. Denies pain with intercourse, dysuria, frequency, hematuria or incontinence. Denies hot flashes, pelvic pain, vaginal bleeding or vaginal discharge.   Denies joint pain, back pain or muscle pain/cramps. Denies itching, rash, or wounds. Denies dizziness, headaches, numbness or seizures. Denies swollen lymph nodes or glands, denies easy bruising or bleeding. Denies anxiety, depression, confusion, or decreased concentration.  Physical Exam: BP (!) 153/87   Pulse 75   Temp 98 F (36.7 C) (Oral)   Resp 16   Ht 5' (1.524 m)   Wt 144 lb (65.3 kg)   SpO2 96%   BMI 28.12 kg/m  General: Alert, oriented, no acute distress. HEENT: Normocephalic, atraumatic, sclera anicteric. Chest: Unlabored breathing on room air.  No wheezes or rhonchi. Cardiovascular: Regular rate and rhythm, no murmurs or rubs appreciated. Abdomen: soft, nontender.  Normoactive bowel sounds.  No masses or hepatosplenomegaly appreciated.  Well-healed incision, remainder of Dermabond removed. Extremities: Grossly normal range of motion.  Warm, well perfused.  No edema bilaterally.  Laboratory & Radiologic Studies: A. VULVA, BIOPSY:  - High-grade squamous  intraepithelial lesion (HSIL/VIN-2).   Assessment & Plan: Katherine [name] is a 58 y.o. 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 Surgery 08/2022. No LVI.  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  Presents today for definitive management of high grade dysplasia with WLE. Risks include but not limited to bleeding, need for blood transfusion, infection, wound separation, damage to surrounding structures, VTE, anesthesia complications, and very rare risk of death.  Comer Dollar, MD  Division of Gynecologic Oncology  Department of Obstetrics and Gynecology  [facility]

## 2024-05-09 NOTE — Anesthesia Postprocedure Evaluation (Signed)
 Anesthesia Post Note  Patient: Katherine [name]  Procedure(s) Performed: EXAM UNDER ANESTHESIA, PELVIC WIDE EXCISION VULVECTOMY (Vulva)     Patient location during evaluation: PACU Anesthesia Type: General Level of consciousness: awake and alert Pain management: pain level controlled Vital Signs Assessment: post-procedure vital signs reviewed and stable Respiratory status: spontaneous breathing, nonlabored ventilation, respiratory function stable and patient connected to nasal cannula oxygen Cardiovascular status: blood pressure returned to baseline and stable Postop Assessment: no apparent nausea or vomiting Anesthetic complications: no   No notable events documented.  Last Vitals:  Vitals:   05/09/24 0915 05/09/24 0917  BP: (!) 147/116 (!) 178/96  Pulse: 69 65  Resp:  20  Temp:  36.6 C  SpO2: 100% 98%    Last Pain:  Vitals:   05/09/24 0917  TempSrc:   PainSc: 0-No pain                 Guadalupe Kerekes

## 2024-05-09 NOTE — Anesthesia Procedure Notes (Signed)
 Procedure Name: LMA Insertion Date/Time: 05/09/2024 7:29 AM  Performed by: Belvie Valri NOVAK, CRNAPre-anesthesia Checklist: Patient identified, Emergency Drugs available, Suction available and Patient being monitored Patient Re-evaluated:Patient Re-evaluated prior to induction Oxygen Delivery Method: Circle System Utilized Preoxygenation: Pre-oxygenation with 100% oxygen Induction Type: IV induction LMA: LMA inserted LMA Size: 3.0 Number of attempts: 1 Airway Equipment and Method: Bite block Placement Confirmation: positive ETCO2 Tube secured with: Tape Dental Injury: Teeth and Oropharynx as per pre-operative assessment

## 2024-05-09 NOTE — Op Note (Signed)
 Operative Report  PATIENT: Katherine [name] DATE: 05/09/24  Preop Diagnosis: VENITA, history of cervical cancer  Postoperative Diagnosis: same as above  Surgery: Partial simple posterior left vulvectomy  Surgeons:  Viktoria Crank, MD  Assistant: none  Anesthesia: LMA  Estimated blood loss: 25 ml  IVF:  see I&O flowsheet   Urine output: n/a   Complications: None apparent  Pathology: Perineum with marking stitch at 12 o'clock  Operative findings: 1 cm slightly raised lesion of leukoplakia from 5 to 6 o'clock along posterior right vulva/perineum with mild surrounding acetowhite after application of 5 % acetic acid . No other lesions noted.   Procedure: The patient was identified in the preoperative holding area. Informed consent was signed on the chart. Patient was seen history was reviewed and exam was performed.   The patient was then taken to the operating room and placed in the supine position with SCD hose on. General anesthesia was then induced without difficulty. She was then placed in the dorsolithotomy position. The perineum was prepped with Betadine . The vagina was prepped with Betadine . The patient was then draped after the prep was dried.   Timeout was performed the patient, procedure, antibiotic, allergy, and length of procedure. 5% acetic acid  solution was applied to the perineum. The vulvar tissues were inspected for areas of acetowhite changes or leukoplakia. The lesion was identified and the marking pen was used to circumscribe the area with appropriate surgical margins. The subcuticular tissues were infiltrated with 0.25% marcaine . The 15 blade scalpel was used to make an incision through the skin circumferentially as marked. The skin elipse was grasped and was separated from the underlying deep dermal tissues with the bovie device. After the specimen had been completely resected, it was oriented and marked at 12 o'clock with a 0-vicryl suture. The bovie was used  to obtain hemostasis at the surgical bed. The subcutaneous tissues were irrigated and made hemostatic.   The deep dermal layer was approximated with 2-0 and 3-0 vicryl mattress sutures to bring the skin edges into approximation and off tension. The wound was closed following langher's lines. The cutaneous layer was closed with interrupted 4-0 vicryl stitches and mattress sutures to ensure a tension free and hemostatic closure. Exparel  was injected for local anesthesia. The perineum was again irrigated.   All instrument, suture, laparotomy, Ray-Tec, and needle counts were correct x2. The patient tolerated the procedure well and was taken recovery room in stable condition.  Crank Viktoria MD Gynecologic Oncology

## 2024-05-09 NOTE — Transfer of Care (Signed)
 Immediate Anesthesia Transfer of Care Note  Patient: Katherine [name]  Procedure(s) Performed: EXAM UNDER ANESTHESIA, PELVIC WIDE EXCISION VULVECTOMY (Vulva)  Patient Location: PACU  Anesthesia Type:General  Level of Consciousness: awake  Airway & Oxygen Therapy: Patient Spontanous Breathing  Post-op Assessment: Report given to RN and Post -op Vital signs reviewed and stable  Post vital signs: Reviewed and stable  Last Vitals:  Vitals Value Taken Time  BP    Temp    Pulse    Resp    SpO2      Last Pain:  Vitals:   05/09/24 0546  TempSrc:   PainSc: 0-No pain         Complications: No notable events documented.

## 2024-05-10 ENCOUNTER — Telehealth: Payer: Self-pay | Admitting: *Deleted

## 2024-05-10 ENCOUNTER — Ambulatory Visit: Payer: Self-pay | Admitting: Gynecologic Oncology

## 2024-05-10 ENCOUNTER — Encounter (HOSPITAL_COMMUNITY): Payer: Self-pay | Admitting: Gynecologic Oncology

## 2024-05-10 ENCOUNTER — Telehealth: Admitting: Nurse Practitioner

## 2024-05-10 DIAGNOSIS — J40 Bronchitis, not specified as acute or chronic: Secondary | ICD-10-CM

## 2024-05-10 LAB — SURGICAL PATHOLOGY

## 2024-05-10 MED ORDER — AZITHROMYCIN 250 MG PO TABS: ORAL_TABLET | ORAL | 0 refills | Status: AC

## 2024-05-10 MED ORDER — BENZONATATE 100 MG PO CAPS: 100.0000 mg | ORAL_CAPSULE | Freq: Three times a day (TID) | ORAL | 0 refills | Status: AC | PRN

## 2024-05-10 MED ORDER — PREDNISONE 20 MG PO TABS: 20.0000 mg | ORAL_TABLET | Freq: Two times a day (BID) | ORAL | 0 refills | Status: AC

## 2024-05-10 NOTE — Telephone Encounter (Signed)
 Spoke with Katherine [name] this morning. She states she is eating, drinking and urinating well. She has not had a BM yet but is passing gas. She is taking senokot as prescribed and encouraged her to drink plenty of water. She denies fever or chills. Incisions are dry and intact. Pt states she is only having soreness and rates it 7/10 and has taken tramadol  once last night and once this am and will switch to tylenol  today and only take tramadol  at night if needed because it makes her dizzy.   Instructed to call office with any fever, chills, purulent drainage, uncontrolled pain or any other questions or concerns. Patient verbalizes understanding.   Pt aware of post op appointments as well as the office number [phone] and after hours number [phone] to call if she has any questions or concerns

## 2024-05-10 NOTE — Progress Notes (Signed)
 Virtual Visit Consent   Katherine [name], you are scheduled for a virtual visit with a [facility] provider today. Just as with appointments in the office, your consent must be obtained to participate. Your consent will be active for this visit and any virtual visit you may have with one of our providers in the next 365 days. If you have a MyChart account, a copy of this consent can be sent to you electronically.  As this is a virtual visit, video technology does not allow for your provider to perform a traditional examination. This may limit your provider's ability to fully assess your condition. If your provider identifies any concerns that need to be evaluated in person or the need to arrange testing (such as labs, EKG, etc.), we will make arrangements to do so. Although advances in technology are sophisticated, we cannot ensure that it will always work on either your end or our end. If the connection with a video visit is poor, the visit may have to be switched to a telephone visit. With either a video or telephone visit, we are not always able to ensure that we have a secure connection.  By engaging in this virtual visit, you consent to the provision of healthcare and authorize for your insurance to be billed (if applicable) for the services provided during this visit. Depending on your insurance coverage, you may receive a charge related to this service.  I need to obtain your verbal consent now. Are you willing to proceed with your visit today? Katherine [name] has provided verbal consent on 05/10/2024 for a virtual visit (video or telephone). Katherine Kitty, FNP  Date: 05/10/2024 2:00 PM   Virtual Visit via Video Note   I, Katherine [name], connected with  Katherine [name]  ([id number], 09-10-1966) on 05/10/24 at  2:00 PM EDT by a video-enabled telemedicine application and verified that I am speaking with the correct person using two identifiers.  Location: Patient: Virtual Visit Location Patient: Home Provider:  Virtual Visit Location Provider: Home Office   I discussed the limitations of evaluation and management by telemedicine and the availability of in person appointments. The patient expressed understanding and agreed to proceed.    History of Present Illness: Katherine [name] is a 58 y.o. who identifies as a female who was assigned female at birth, and is being seen today for concerns over a cough that she has had for the past 2 weeks. The cough has progressively gotten worse and has become more productive with a darker color to the phlegm.  She has a history of asthma and has noted that she has been wheezing. She has been using her inhaler with some relief.   She has suffered from bronchitis in the past as well.  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   Right foot pain 02/28/2024   History of fracture of foot 02/27/2024   Prolapsed internal hemorrhoids, grade 2 09/08/2022   Tinnitus of both ears 08/29/2022   Mixed hyperlipidemia 08/29/2022   Iron deficiency anemia 08/29/2022   CIN III (cervical intraepithelial neoplasia grade III) with severe dysplasia    Malignant neoplasm of cervix (HCC) 06/07/2022   Hemorrhoids 05/24/2022   Encounter for general  adult medical examination with abnormal findings 04/27/2022   Depression, recurrent (HCC) 04/27/2022   GERD (gastroesophageal reflux disease) 11/09/2021   Essential hypertension 04/21/2020   Dysuria 04/21/2020   Mild persistent asthma 04/21/2020    Allergies:  Allergies  Allergen Reactions   Amoxicillin  Hives   Medications:  Current Outpatient Medications:    ADVAIR HFA 230-21 MCG/ACT inhaler, INHALE 2 PUFFS INTO THE LUNGS TWICE A DAY, Disp: 12 each, Rfl:  5   albuterol  (VENTOLIN  HFA) 108 (90 Base) MCG/ACT inhaler, TAKE 2 PUFFS BY MOUTH EVERY 6 HOURS AS NEEDED FOR WHEEZE OR SHORTNESS OF BREATH, Disp: 8.5 each, Rfl: 3   buPROPion  (WELLBUTRIN  XL) 150 MG 24 hr tablet, TAKE 1 TABLET BY MOUTH EVERY DAY, Disp: 90 tablet, Rfl: 2   cholecalciferol (VITAMIN D3) 25 MCG (1000 UNIT) tablet, Take 1,000 Units by mouth daily., Disp: , Rfl:    cyanocobalamin (VITAMIN B12) 1000 MCG tablet, Take 1,000 mcg by mouth daily., Disp: , Rfl:    diclofenac  Sodium (VOLTAREN ) 1 % GEL, Apply 2 g topically 4 (four) times daily. (Patient taking differently: Apply 2 g topically 4 (four) times daily as needed (pain).), Disp: 150 g, Rfl: 0   estradiol  (VIVELLE -DOT) 0.025 MG/24HR, PLACE 1 PATCH ONTO THE SKIN 2 TIMES A WEEK., Disp: 24 patch, Rfl: 5   losartan  (COZAAR ) 50 MG tablet, TAKE 1 TABLET BY MOUTH EVERY DAY, Disp: 90 tablet, Rfl: 1   Magnesium 250 MG TABS, Take 250 mg by mouth daily., Disp: , Rfl:    meloxicam  (MOBIC ) 15 MG tablet, Take 0.5 tablets (7.5 mg total) by mouth daily., Disp: 30 tablet, Rfl: 0   Misc. Devices MISC, Blood pressure cuff/device - 1. ICD10: I10, Disp: 1 each, Rfl: 0   omeprazole  (PRILOSEC) 40 MG capsule, Take 1 capsule (40 mg total) by mouth daily. (Patient taking differently: Take 40 mg by mouth daily as needed (heartburn).), Disp: 30 capsule, Rfl: 3   Probiotic Product (PROBIOTIC DAILY PO), Take 1 capsule by mouth daily., Disp: , Rfl:    rosuvastatin  (CRESTOR ) 10 MG tablet, TAKE 1 TABLET BY MOUTH EVERY DAY, Disp: 90 tablet, Rfl: 1   senna (SENOKOT) 8.6 MG TABS tablet, Take 1 tablet (8.6 mg total) by mouth at bedtime as needed for mild constipation., Disp: 20 tablet, Rfl: 1   sucralfate  (CARAFATE ) 1 g tablet, Take 1 tablet (1 g total) by mouth 4 (four) times daily -  with meals and at bedtime., Disp: 40 tablet, Rfl: 0   traMADol  (ULTRAM ) 50 MG tablet, Take 1 tablet (50 mg total) by mouth every 6 (six) hours as needed for severe pain (pain score 7-10). For  AFTER surgery only, do not take and drive, Disp: 10 tablet, Rfl: 0   vitamin C (ASCORBIC ACID) 250 MG tablet, Take 250 mg by mouth daily., Disp: , Rfl:   Observations/Objective: Patient is well-developed, well-nourished in no acute distress.  Resting comfortably  at home.  Head is normocephalic, atraumatic.  No labored breathing.  Speech is clear and coherent with logical content.  Patient is alert and oriented at baseline.    Assessment and Plan:  1. Bronchitis (Primary)  Meds ordered this encounter  Medications   predniSONE  (DELTASONE ) 20 MG tablet    Sig: Take 1 tablet (20 mg total) by mouth 2 (two) times daily with a meal for 5 days.    Dispense:  10 tablet    Refill:  0   azithromycin  (ZITHROMAX ) 250 MG tablet    Sig: Take  2 tablets on day 1, then 1 tablet daily on days 2 through 5    Dispense:  6 tablet    Refill:  0   benzonatate  (TESSALON ) 100 MG capsule    Sig: Take 1 capsule (100 mg total) by mouth 3 (three) times daily as needed.    Dispense:  30 capsule    Refill:  0     Follow Up Instructions: I discussed the assessment and treatment plan with the patient. The patient was provided an opportunity to ask questions and all were answered. The patient agreed with the plan and demonstrated an understanding of the instructions.  A copy of instructions were sent to the patient via MyChart unless otherwise noted below.    The patient was advised to call back or seek an in-person evaluation if the symptoms worsen or if the condition fails to improve as anticipated.    Katherine Kitty, FNP

## 2024-05-28 ENCOUNTER — Ambulatory Visit (INDEPENDENT_AMBULATORY_CARE_PROVIDER_SITE_OTHER): Admitting: Internal Medicine

## 2024-05-28 ENCOUNTER — Encounter: Payer: Self-pay | Admitting: Internal Medicine

## 2024-05-28 VITALS — BP 162/82 | HR 71 | Ht 60.0 in | Wt 147.4 lb

## 2024-05-28 DIAGNOSIS — E782 Mixed hyperlipidemia: Secondary | ICD-10-CM

## 2024-05-28 DIAGNOSIS — F339 Major depressive disorder, recurrent, unspecified: Secondary | ICD-10-CM | POA: Diagnosis not present

## 2024-05-28 DIAGNOSIS — R7303 Prediabetes: Secondary | ICD-10-CM | POA: Diagnosis not present

## 2024-05-28 DIAGNOSIS — Z23 Encounter for immunization: Secondary | ICD-10-CM

## 2024-05-28 DIAGNOSIS — I10 Essential (primary) hypertension: Secondary | ICD-10-CM | POA: Diagnosis not present

## 2024-05-28 DIAGNOSIS — K219 Gastro-esophageal reflux disease without esophagitis: Secondary | ICD-10-CM

## 2024-05-28 DIAGNOSIS — J4530 Mild persistent asthma, uncomplicated: Secondary | ICD-10-CM

## 2024-05-28 MED ORDER — LOSARTAN POTASSIUM 50 MG PO TABS: 75.0000 mg | ORAL_TABLET | Freq: Every day | ORAL | 1 refills | Status: AC

## 2024-05-28 MED ORDER — FLUOXETINE HCL 10 MG PO CAPS: 10.0000 mg | ORAL_CAPSULE | Freq: Every day | ORAL | 3 refills | Status: DC

## 2024-05-28 NOTE — Progress Notes (Unsigned)
 Established Patient Office Visit  Subjective:  Patient ID: Katherine [name], female    DOB: 1966-06-29  Age: 58 y.o. MRN: [id number]  CC:  Chief Complaint  Patient presents with   Hypertension    Follow up   Gastroesophageal Reflux    Follow up   Nasal Congestion    Reports sx of phlegm in chest     HPI Katherine [name] is a 58 y.o. female with past medical history of HTN, GERD, asthma and MDD who presents for f/u of her chronic medical conditions.  She was last seen in 01/24.  She reports right foot pain for the last 2 weeks, which is constant, progressive, sharp and radiating towards the toes.  Denies any recent injury or fall.  She has history of right ankle fracture and has hardware in place.  She has not seen podiatrist recently.  She has tried taking Tylenol  and ibuprofen  without much relief.  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  She is followed by Dr. Viktoria for it.  She denies any vaginal discharge or bleeding currently. She denies any fatigue or exertional dyspnea currently.  HTN: Her BP was still elevated today.  She is taking losartan  50 mg daily regularly.  She has history of elevated BP in previous office visits.  She currently denies any headache, dizziness, chest pain, dyspnea or palpitations.  GERD: She has stopped taking Omeprazole  for it.  Denies any nausea, vomiting or epigastric pain now.  MDD: She had noticed improvement with Zoloft  initially, but later stopped taking it as she felt weird with it.  She has spells of anxiety, anhedonia and insomnia.  Her right foot pain is aggravating her anxiety.  Asthma: She has albuterol  as needed for dyspnea or wheezing.  She also has advair inhaler.  Denies any recent worsening of dyspnea or wheezing.   Past Medical History:  Diagnosis Date   Allergy    Amoxicillin    Arthritis    Asthma, mild    followed by pcp   Chronic kidney disease 2008   surgery for Kidney blockage   Depression     GAD (generalized anxiety disorder)    GERD (gastroesophageal reflux disease)    History of hiatal hernia    History of obstruction of ureter 2005   s/p  left ureteral stent (per pt not due to stone,  resolved)   Malignant neoplasm cervix (HCC) 06/07/2022   [facility]   Pneumonia    Wears dentures    full upper   Wears glasses     Past Surgical History:  Procedure Laterality Date   ABDOMINAL HYSTERECTOMY  2023   CERVICAL CONIZATION W/BX N/A 06/30/2022   Procedure: CONIZATION CERVIX WITH BIOPSY; POST CONE ENDOCERVICAL CURRETTAGE;  Surgeon: Viktoria Comer SAUNDERS, MD;  Location: [facility];  Service: Gynecology;  Laterality: N/A;   CESAREAN SECTION  1993   COLONOSCOPY WITH PROPOFOL   06/21/2022   COLONOSCOPY WITH PROPOFOL  N/A 06/21/2022   Procedure: COLONOSCOPY WITH PROPOFOL ;  Surgeon: Cindie Carlin POUR, DO;  Location: [facility];  Service: Endoscopy;  Laterality: N/A;  8:45 am ASA 2   CYSTOSCOPY WITH RETROGRADE PYELOGRAM, URETEROSCOPY AND STENT PLACEMENT Left 2005   for left upj obstructions (not due to stone)   ESOPHAGOGASTRODUODENOSCOPY (EGD) WITH PROPOFOL  N/A 06/21/2022   Procedure: ESOPHAGOGASTRODUODENOSCOPY (EGD) WITH PROPOFOL ;  Surgeon: Cindie Carlin POUR, DO;  Location: [facility];  Service: Endoscopy;  Laterality: N/A;   EXAM UNDER ANESTHESIA, PELVIC N/A 05/09/2024  Procedure: EXAM UNDER ANESTHESIA, PELVIC;  Surgeon: Viktoria Comer SAUNDERS, MD;  Location: [facility];  Service: Gynecology;  Laterality: N/A;   LAPAROSCOPIC TUBAL LIGATION Bilateral 1998   LYMPH NODE DISSECTION N/A 08/10/2022   Procedure: PELVIC LYMPH NODE DISSECTION;  Surgeon: Viktoria Comer SAUNDERS, MD;  Location: [facility];  Service: Gynecology;  Laterality: N/A;   ORIF ANKLE FRACTURE Right 2010   has retained hardware   POLYPECTOMY  06/21/2022   Procedure: POLYPECTOMY;  Surgeon: Cindie Carlin POUR, DO;  Location: [facility];  Service: Endoscopy;;   RADICAL HYSTERECTOMY N/A 08/10/2022   Procedure: RADICAL  HYSTERECTOMY, BILATERAL SALPINGO-OOPHORECTOMY;  Surgeon: Viktoria Comer SAUNDERS, MD;  Location: [facility];  Service: Gynecology;  Laterality: N/A;   TUBAL LIGATION  1998   VULVECTOMY N/A 05/09/2024   Procedure: WIDE EXCISION VULVECTOMY;  Surgeon: Viktoria Comer SAUNDERS, MD;  Location: [facility];  Service: Gynecology;  Laterality: N/A;    Family History  Problem Relation Age of Onset   Hypothyroidism Mother    Hypertension Mother    Anxiety disorder Mother    Arthritis Mother    Hypertension Father    Stroke Father    Hypothyroidism Sister    ADD / ADHD Sister    Asthma Sister    ADD / ADHD Sister    ADD / ADHD Sister    ADD / ADHD Brother    ADD / ADHD Brother    Colon cancer Brother    Depression Daughter    Depression Daughter    Colon polyps Neg Hx    Breast cancer Neg Hx    Ovarian cancer Neg Hx    Endometrial cancer Neg Hx    Pancreatic cancer Neg Hx    Prostate cancer Neg Hx     Social History   Socioeconomic History   Marital status: Divorced    Spouse name: Not on file   Number of children: 4   Years of education: Not on file   Highest education level: Some college, no degree  Occupational History   Not on file  Tobacco Use   Smoking status: Never   Smokeless tobacco: Never  Vaping Use   Vaping status: Never Used  Substance and Sexual Activity   Alcohol use: Not Currently    Comment: seldom   Drug use: Never   Sexual activity: Not Currently    Partners: Male    Birth control/protection: Surgical  Other Topics Concern   Not on file  Social History Narrative   Lives alone   4 children-one in [place], [place], [place], and [place]  in the [employer]    [place] children 2      Enjoy: spending time with family, children visit often       Diets: eats all food groups    Caffeine: 1 cup coffee twice week   Water: 6-8 cups      Wears seat belt    Does not use phone while driving-handfree   Smoke detectors at home   No weapons in the house    Social Drivers of Health    Financial Resource Strain: Medium Risk (05/24/2024)   Overall Financial Resource Strain (CARDIA)    Difficulty of Paying Living Expenses: Somewhat hard  Food Insecurity: Food Insecurity Present (05/24/2024)   Hunger Vital Sign    Worried About Running Out of Food in the Last Year: Sometimes true    Ran Out of Food in the Last Year: Sometimes true  Transportation Needs: No Transportation Needs (05/24/2024)  PRAPARE - Administrator, Civil Service (Medical): No    Lack of Transportation (Non-Medical): No  Physical Activity: Insufficiently Active (05/24/2024)   Exercise Vital Sign    Days of Exercise per Week: 3 days    Minutes of Exercise per Session: 30 min  Stress: Stress Concern Present (05/24/2024)   [employer] of Occupational Health - Occupational Stress Questionnaire    Feeling of Stress: Rather much  Social Connections: Socially Isolated (05/24/2024)   Social Connection and Isolation Panel    Frequency of Communication with Friends and Family: Once a week    Frequency of Social Gatherings with Friends and Family: Once a week    Attends Religious Services: Never    Database administrator or Organizations: No    Attends Engineer, structural: Not on file    Marital Status: Divorced  Intimate Partner Violence: Not At Risk (06/12/2023)   Humiliation, Afraid, Rape, and Kick questionnaire    Fear of Current or Ex-Partner: No    Emotionally Abused: No    Physically Abused: No    Sexually Abused: No    Outpatient Medications Prior to Visit  Medication Sig Dispense Refill   ADVAIR HFA 230-21 MCG/ACT inhaler INHALE 2 PUFFS INTO THE LUNGS TWICE A DAY 12 each 5   albuterol  (VENTOLIN  HFA) 108 (90 Base) MCG/ACT inhaler TAKE 2 PUFFS BY MOUTH EVERY 6 HOURS AS NEEDED FOR WHEEZE OR SHORTNESS OF BREATH 8.5 each 3   benzonatate  (TESSALON ) 100 MG capsule Take 1 capsule (100 mg total) by mouth 3 (three) times daily as needed. 30 capsule 0   buPROPion  (WELLBUTRIN  XL) 150  MG 24 hr tablet TAKE 1 TABLET BY MOUTH EVERY DAY 90 tablet 2   cholecalciferol (VITAMIN D3) 25 MCG (1000 UNIT) tablet Take 1,000 Units by mouth daily.     cyanocobalamin (VITAMIN B12) 1000 MCG tablet Take 1,000 mcg by mouth daily.     diclofenac  Sodium (VOLTAREN ) 1 % GEL Apply 2 g topically 4 (four) times daily. (Patient taking differently: Apply 2 g topically 4 (four) times daily as needed (pain).) 150 g 0   estradiol  (VIVELLE -DOT) 0.025 MG/24HR PLACE 1 PATCH ONTO THE SKIN 2 TIMES A WEEK. 24 patch 5   losartan  (COZAAR ) 50 MG tablet TAKE 1 TABLET BY MOUTH EVERY DAY 90 tablet 1   Magnesium 250 MG TABS Take 250 mg by mouth daily.     meloxicam  (MOBIC ) 15 MG tablet Take 0.5 tablets (7.5 mg total) by mouth daily. 30 tablet 0   Misc. Devices MISC Blood pressure cuff/device - 1. ICD10: I10 1 each 0   omeprazole  (PRILOSEC) 40 MG capsule Take 1 capsule (40 mg total) by mouth daily. (Patient taking differently: Take 40 mg by mouth daily as needed (heartburn).) 30 capsule 3   Probiotic Product (PROBIOTIC DAILY PO) Take 1 capsule by mouth daily.     rosuvastatin  (CRESTOR ) 10 MG tablet TAKE 1 TABLET BY MOUTH EVERY DAY 90 tablet 1   senna (SENOKOT) 8.6 MG TABS tablet Take 1 tablet (8.6 mg total) by mouth at bedtime as needed for mild constipation. 20 tablet 1   sucralfate  (CARAFATE ) 1 g tablet Take 1 tablet (1 g total) by mouth 4 (four) times daily -  with meals and at bedtime. 40 tablet 0   traMADol  (ULTRAM ) 50 MG tablet Take 1 tablet (50 mg total) by mouth every 6 (six) hours as needed for severe pain (pain score 7-10). For AFTER surgery only, do not  take and drive 10 tablet 0   vitamin C (ASCORBIC ACID) 250 MG tablet Take 250 mg by mouth daily.     No facility-administered medications prior to visit.    Allergies  Allergen Reactions   Amoxicillin  Hives    ROS Review of Systems  Constitutional:  Negative for chills and fever.  HENT:  Positive for tinnitus. Negative for congestion, ear discharge, ear  pain, hearing loss, sinus pressure, sinus pain and sore throat.   Eyes:  Negative for pain and discharge.  Respiratory:  Negative for cough and shortness of breath.   Cardiovascular:  Negative for chest pain and palpitations.  Gastrointestinal:  Negative for constipation, nausea and vomiting.  Endocrine: Negative for polydipsia and polyuria.  Genitourinary:  Negative for dysuria and hematuria.  Musculoskeletal:  Negative for neck pain and neck stiffness.       Right ankle and foot pain  Skin:  Negative for rash.  Neurological:  Negative for dizziness and weakness.  Psychiatric/Behavioral:  Negative for agitation and behavioral problems.       Objective:    Physical Exam Vitals reviewed.  Constitutional:      General: She is not in acute distress.    Appearance: She is not diaphoretic.  HENT:     Head: Normocephalic and atraumatic.     Mouth/Throat:     Mouth: Mucous membranes are moist.  Eyes:     General: No scleral icterus.    Extraocular Movements: Extraocular movements intact.  Cardiovascular:     Rate and Rhythm: Normal rate and regular rhythm.     Pulses:          Dorsalis pedis pulses are 2+ on the right side.     Heart sounds: Normal heart sounds. No murmur heard. Pulmonary:     Breath sounds: Normal breath sounds. No wheezing or rales.  Musculoskeletal:     Cervical back: Neck supple. No tenderness.     Right lower leg: No edema.     Left lower leg: No edema.     Right ankle: Swelling present. Decreased range of motion.     Right foot: Decreased range of motion.  Skin:    General: Skin is warm.     Findings: No rash.  Neurological:     General: No focal deficit present.     Mental Status: She is alert and oriented to person, place, and time.     Sensory: No sensory deficit.     Motor: No weakness.  Psychiatric:        Mood and Affect: Mood normal.        Behavior: Behavior normal.     BP (!) 162/80 (BP Location: Left Arm)   Pulse 71   Ht 5' (1.524 m)    Wt 147 lb 6.4 oz (66.9 kg)   SpO2 98%   BMI 28.79 kg/m  Wt Readings from Last 3 Encounters:  05/28/24 147 lb 6.4 oz (66.9 kg)  05/09/24 144 lb (65.3 kg)  05/03/24 144 lb (65.3 kg)    Lab Results  Component Value Date   TSH 2.350 04/27/2022   Lab Results  Component Value Date   WBC 10.5 05/03/2024   HGB 13.5 05/03/2024   HCT 41.5 05/03/2024   MCV 90.0 05/03/2024   PLT 282 05/03/2024   Lab Results  Component Value Date   NA 141 05/03/2024   K 3.9 05/03/2024   CO2 23 05/03/2024   GLUCOSE 122 (H) 05/03/2024   BUN 19 05/03/2024  CREATININE 0.73 05/03/2024   BILITOT 0.6 08/05/2022   ALKPHOS 122 08/05/2022   AST 28 08/05/2022   ALT 20 08/05/2022   PROT 7.8 08/05/2022   ALBUMIN  4.4 08/05/2022   CALCIUM  9.6 05/03/2024   ANIONGAP 12 05/03/2024   EGFR 101 10/25/2022   Lab Results  Component Value Date   CHOL 310 (H) 10/25/2022   Lab Results  Component Value Date   HDL 82 10/25/2022   Lab Results  Component Value Date   LDLCALC 201 (H) 10/25/2022   Lab Results  Component Value Date   TRIG 149 10/25/2022   Lab Results  Component Value Date   CHOLHDL 3.8 10/25/2022   Lab Results  Component Value Date   HGBA1C 5.5 04/27/2022      Assessment & Plan:   Problem List Items Addressed This Visit   None     No orders of the defined types were placed in this encounter.   Follow-up: No follow-ups on file.    Suzzane MARLA Blanch, MD

## 2024-05-28 NOTE — Patient Instructions (Addendum)
 Please start taking Losartan  75 mg (1.5 tablet of 50 mg) once daily.  Please start taking Fluoxetine  10 mg once daily.  Please continue to take other medications as prescribed.  Please continue to follow DASH diet and perform moderate exercise/walking at least 150 mins/week.

## 2024-05-28 NOTE — Assessment & Plan Note (Signed)
 Lipid profile reviewed from 01/24 Continue Crestor  10 mg QD Advised to follow DASH diet for now

## 2024-05-28 NOTE — Assessment & Plan Note (Signed)
 Uncontrolled Had to stop taking Zoloft  due to mood symptoms Switched to Wellbutrin  150 mg once daily in the last visit Would discuss about BH therapy in the next visit, she denies for now

## 2024-05-28 NOTE — Assessment & Plan Note (Addendum)
 Well-controlled with Omeprazole  40 mg QD Had worsening of acid reflux with meloxicam  and Wellbutrin , would avoid oral NSAIDs Avoid hot and spicy food

## 2024-05-28 NOTE — Assessment & Plan Note (Addendum)
 BP Readings from Last 1 Encounters:  05/28/24 (!) 162/80   Uncontrolled with losartan  50 mg QD Check BMP Counseled for compliance with the medications Advised DASH diet and moderate exercise/walking, at least 150 mins/week

## 2024-05-28 NOTE — Assessment & Plan Note (Signed)
Well-controlled currently On Advair and albuterol PRN

## 2024-05-29 LAB — HEMOGLOBIN A1C
Est. average glucose Bld gHb Est-mCnc: 111 mg/dL
Hgb A1c MFr Bld: 5.5 % (ref 4.8–5.6)

## 2024-05-29 LAB — CMP14+EGFR
ALT: 42 IU/L — ABNORMAL HIGH (ref 0–32)
AST: 40 IU/L (ref 0–40)
Albumin: 4.5 g/dL (ref 3.8–4.9)
Alkaline Phosphatase: 209 IU/L — ABNORMAL HIGH (ref 44–121)
BUN/Creatinine Ratio: 14 (ref 9–23)
BUN: 10 mg/dL (ref 6–24)
Bilirubin Total: 0.3 mg/dL (ref 0.0–1.2)
CO2: 20 mmol/L (ref 20–29)
Calcium: 9.5 mg/dL (ref 8.7–10.2)
Chloride: 105 mmol/L (ref 96–106)
Creatinine, Ser: 0.73 mg/dL (ref 0.57–1.00)
Globulin, Total: 2.7 g/dL (ref 1.5–4.5)
Glucose: 93 mg/dL (ref 70–99)
Potassium: 4.5 mmol/L (ref 3.5–5.2)
Sodium: 141 mmol/L (ref 134–144)
Total Protein: 7.2 g/dL (ref 6.0–8.5)
eGFR: 95 mL/min/1.73 (ref >59)

## 2024-05-29 LAB — LIPID PANEL
Chol/HDL Ratio: 3.7 ratio (ref 0.0–4.4)
Cholesterol, Total: 280 mg/dL — ABNORMAL HIGH (ref 100–199)
HDL: 76 mg/dL (ref >39)
LDL Chol Calc (NIH): 169 mg/dL — ABNORMAL HIGH (ref 0–99)
Triglycerides: 195 mg/dL — ABNORMAL HIGH (ref 0–149)
VLDL Cholesterol Cal: 35 mg/dL (ref 5–40)

## 2024-05-29 LAB — CBC WITH DIFFERENTIAL/PLATELET
Basophils Absolute: 0.1 x10E3/uL (ref 0.0–0.2)
Basos: 1 %
EOS (ABSOLUTE): 0.3 x10E3/uL (ref 0.0–0.4)
Eos: 4 %
Hematocrit: 41.7 % (ref 34.0–46.6)
Hemoglobin: 13.5 g/dL (ref 11.1–15.9)
Immature Grans (Abs): 0 x10E3/uL (ref 0.0–0.1)
Immature Granulocytes: 0 %
Lymphocytes Absolute: 2.4 x10E3/uL (ref 0.7–3.1)
Lymphs: 32 %
MCH: 29.4 pg (ref 26.6–33.0)
MCHC: 32.4 g/dL (ref 31.5–35.7)
MCV: 91 fL (ref 79–97)
Monocytes Absolute: 0.7 x10E3/uL (ref 0.1–0.9)
Monocytes: 9 %
Neutrophils Absolute: 4.1 x10E3/uL (ref 1.4–7.0)
Neutrophils: 54 %
Platelets: 275 x10E3/uL (ref 150–450)
RBC: 4.59 x10E6/uL (ref 3.77–5.28)
RDW: 13 % (ref 11.7–15.4)
WBC: 7.6 x10E3/uL (ref 3.4–10.8)

## 2024-06-07 ENCOUNTER — Encounter: Payer: Self-pay | Admitting: Gynecologic Oncology

## 2024-06-07 ENCOUNTER — Ambulatory Visit: Attending: Gynecologic Oncology | Admitting: Gynecologic Oncology

## 2024-06-07 VITALS — BP 141/77 | HR 74 | Temp 98.1°F | Resp 16 | Wt 146.0 lb

## 2024-06-07 DIAGNOSIS — Z9079 Acquired absence of other genital organ(s): Secondary | ICD-10-CM

## 2024-06-07 DIAGNOSIS — N9089 Other specified noninflammatory disorders of vulva and perineum: Secondary | ICD-10-CM

## 2024-06-07 DIAGNOSIS — N901 Moderate vulvar dysplasia: Secondary | ICD-10-CM

## 2024-06-07 DIAGNOSIS — Z7189 Other specified counseling: Secondary | ICD-10-CM

## 2024-06-07 DIAGNOSIS — C539 Malignant neoplasm of cervix uteri, unspecified: Secondary | ICD-10-CM

## 2024-06-07 NOTE — Progress Notes (Signed)
 Gynecologic Oncology Return Clinic Visit  06/07/24  Reason for Visit: follow-up  Treatment History: Oncology History Overview Note  Pap test was performed on 05/23/22 and showed squamous cell carcinoma, + HPV 16.   Malignant neoplasm of cervix (HCC)  06/07/2022 Initial Diagnosis   Malignant neoplasm of cervix (HCC)   06/17/2022 Initial Biopsy   Cervical biopsies, ECC  A. 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 CERVIX, 12:00, BIOPSY:  Microinvasive squamous cell carcinoma (0.25 mm depth) arising within  severe dysplasia to carcinoma in situ  Marked chronic and follicular cervicitis with squamous metaplasia   C. ENDOCERVICAL, CURRETAGE:  Numerous detached fragments of squamous cell carcinoma and severe  squamous dysplasia    06/29/2022 Imaging   PET: 1. Very low-grade activity in the vicinity of the cervix, maximum SUV 4.1, possibly reflecting the cervical malignancy. No findings of metastatic disease. 2. Small but hypermetabolic paratracheal, bilateral hilar/infrahilar, and subcarinal lymph nodes, at least one of which is partially calcified, raising some suspicion for granulomatous process such as sarcoidosis, although strictly speaking, lymphoma is not excluded. 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 3. Accentuated palatine tonsillar uptake bilaterally, probably physiologic but technically nonspecific. There is also a small but mildly hypermetabolic left level IIa lymph node with maximum SUV of 4.5. 4. Other imaging findings of potential clinical significance: Acute on chronic paranasal sinusitis. Mild cardiomegaly. Simple  appearing adnexal cysts warrant no further workup.   06/30/2022 Surgery   [facility], ECC  A. CERVIX, CONE:  - Invasive moderately differentiated squamous cell carcinoma arising in  background of extensive carcinoma in situ involving underlying  endocervical glands  - Invasive carcinoma is present in 6-9 and 9-12 o'clock quadrants while  12-3 o'clock and 3-6 o'clock quadrants show presence of carcinoma in  situ  - Carcinoma invades for a depth of at least 0.5 cm (6-9 o'clock  quadrant)  - The deep resection margin is widely positive for carcinoma (6-9  o'clock quadrant)  - All lateral mucosal margins are negative for invasive carcinoma  - Ecto- and endocervical margins in 12-3 o'clock, 3-6 o'clock and 6-9  o'clock quadrants are positive for carcinoma in situ; mucosal margins in  the 9-12 o'clock quadrants are negative   B. ENDOCERVIX, POST CONE, CURETTAGE:  - At least squamous cell carcinoma in situ  - Focal invasive carcinoma cannot be ruled out     Surgery   Type III radical hysterectomy with bilateral salpingoophorectomy and bilateral pelvic lymphadenectomy   On EUA, well-healed cervix, mildly firm suspected secondary to recent cold knife cone procedure.  On intra-abdominal entry, normal upper abdominal survey, no enlarged para-aortic lymph nodes on palpation.  Normal-appearing small and large bowel.  Uterus approximately 6 cm and normal in appearance.  Normal bilateral tubes and ovaries.  No obvious adenopathy with very little lymphatic tissue in the pelvic basins.     Pathologic Stage   A. LYMPH NODE, LEFT PELVIC, EXCISION: - Negative for carcinoma (0/1)  B. LYMPH NODE, RIGHT PELVIC, EXCISION: - Negative for carcinoma (0/1)  C. 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 POSTERIOR MARGIN: - Negative for dysplasia or carcinoma  E. ANTERIOR MARGIN: - Negative for dysplasia or carcinoma  COMMENT:  Multiple levels were examined in the sections from the cervix to rule out microinvasion, and no foci of invasion were identified. Appropriately controlled p16 immunohistochemical stain is positive in the tumor. Slides from part C were reviewed with Dr. Belvie who agrees with the above interpretation.      Interval History: Doing well.  Denies any significant pain.  Has occasional scant bleeding when she wipes the surgical area.  Reports baseline bowel bladder function..  Past Medical/Surgical History: Past Medical History:  Diagnosis Date   Allergy    Amoxicillin    Arthritis    Asthma, mild    followed by pcp   Chronic kidney disease 2008   surgery for Kidney blockage   Depression    GAD (generalized anxiety disorder)    GERD (gastroesophageal reflux disease)    History of hiatal hernia    History of obstruction of ureter 2005   s/p  left ureteral stent (per pt not due to stone,  resolved)   Malignant neoplasm cervix (HCC) 06/07/2022   SCC   Pneumonia    Wears dentures    full upper   Wears glasses     Past Surgical History:  Procedure Laterality Date   ABDOMINAL HYSTERECTOMY  2023   CERVICAL CONIZATION W/BX N/A 06/30/2022   Procedure: CONIZATION CERVIX WITH BIOPSY; POST CONE ENDOCERVICAL CURRETTAGE;  Surgeon: Viktoria Comer SAUNDERS, MD;  Location: [facility];  Service: Gynecology;  Laterality: N/A;   CESAREAN SECTION  1993   COLONOSCOPY WITH PROPOFOL   06/21/2022   COLONOSCOPY WITH PROPOFOL  N/A 06/21/2022   Procedure: COLONOSCOPY WITH  PROPOFOL ;  Surgeon: Cindie Carlin POUR, DO;  Location: [facility];  Service: Endoscopy;  Laterality: N/A;  8:45 am ASA 2   CYSTOSCOPY WITH RETROGRADE PYELOGRAM, URETEROSCOPY AND STENT PLACEMENT Left 2005   for left upj obstructions (not due to stone)   ESOPHAGOGASTRODUODENOSCOPY (EGD) WITH PROPOFOL  N/A 06/21/2022   Procedure: ESOPHAGOGASTRODUODENOSCOPY (EGD) WITH PROPOFOL ;  Surgeon: Cindie Carlin POUR, DO;  Location: [facility];  Service: Endoscopy;  Laterality: N/A;   EXAM UNDER ANESTHESIA, PELVIC N/A 05/09/2024   Procedure: EXAM UNDER ANESTHESIA, PELVIC;  Surgeon: Viktoria Comer SAUNDERS, MD;  Location: [facility];  Service: Gynecology;  Laterality: N/A;   LAPAROSCOPIC TUBAL LIGATION Bilateral 1998   LYMPH NODE DISSECTION N/A 08/10/2022   Procedure: PELVIC LYMPH NODE DISSECTION;  Surgeon: Viktoria Comer SAUNDERS, MD;  Location: [facility];  Service: Gynecology;  Laterality: N/A;   ORIF ANKLE FRACTURE Right 2010   has retained hardware   POLYPECTOMY  06/21/2022   Procedure: POLYPECTOMY;  Surgeon: Cindie Carlin POUR, DO;  Location: [facility];  Service: Endoscopy;;   RADICAL HYSTERECTOMY N/A 08/10/2022   Procedure: RADICAL HYSTERECTOMY, BILATERAL SALPINGO-OOPHORECTOMY;  Surgeon: Viktoria Comer SAUNDERS, MD;  Location: [facility];  Service: Gynecology;  Laterality: N/A;   TUBAL LIGATION  1998   VULVECTOMY N/A 05/09/2024   Procedure: WIDE EXCISION VULVECTOMY;  Surgeon: Viktoria Comer SAUNDERS, MD;  Location: [facility];  Service: Gynecology;  Laterality: N/A;    Family History  Problem Relation Age of Onset  Hypothyroidism Mother    Hypertension Mother    Anxiety disorder Mother    Arthritis Mother    Hypertension Father    Stroke Father    Hypothyroidism Sister    ADD / ADHD Sister    Asthma Sister    ADD / ADHD Sister    ADD / ADHD Sister    ADD / ADHD Brother    ADD / ADHD Brother    Colon cancer Brother    Depression Daughter    Depression Daughter    Colon polyps Neg Hx    Breast cancer Neg Hx     Ovarian cancer Neg Hx    Endometrial cancer Neg Hx    Pancreatic cancer Neg Hx    Prostate cancer Neg Hx     Social History   Socioeconomic History   Marital status: Divorced    Spouse name: Not on file   Number of children: 4   Years of education: Not on file   Highest education level: Some college, no degree  Occupational History   Not on file  Tobacco Use   Smoking status: Never   Smokeless tobacco: Never  Vaping Use   Vaping status: Never Used  Substance and Sexual Activity   Alcohol use: Not Currently    Comment: seldom   Drug use: Never   Sexual activity: Not Currently    Partners: Male    Birth control/protection: Surgical  Other Topics Concern   Not on file  Social History Narrative   Lives alone   4 children-one in [place], [place], [place], and [place]  in the [employer]    [place] children 2      Enjoy: spending time with family, children visit often       Diets: eats all food groups    Caffeine: 1 cup coffee twice week   Water: 6-8 cups      Wears seat belt    Does not use phone while driving-handfree   Smoke detectors at home   No weapons in the house    Social Drivers of Health   Financial Resource Strain: Medium Risk (05/24/2024)   Overall Financial Resource Strain (CARDIA)    Difficulty of Paying Living Expenses: Somewhat hard  Food Insecurity: Food Insecurity Present (05/24/2024)   Hunger Vital Sign    Worried About Running Out of Food in the Last Year: Sometimes true    Ran Out of Food in the Last Year: Sometimes true  Transportation Needs: No Transportation Needs (05/24/2024)   PRAPARE - Administrator, Civil Service (Medical): No    Lack of Transportation (Non-Medical): No  Physical Activity: Insufficiently Active (05/24/2024)   Exercise Vital Sign    Days of Exercise per Week: 3 days    Minutes of Exercise per Session: 30 min  Stress: Stress Concern Present (05/24/2024)   [employer] of Occupational Health - Occupational Stress  Questionnaire    Feeling of Stress: Rather much  Social Connections: Socially Isolated (05/24/2024)   Social Connection and Isolation Panel    Frequency of Communication with Friends and Family: Once a week    Frequency of Social Gatherings with Friends and Family: Once a week    Attends Religious Services: Never    Database administrator or Organizations: No    Attends Engineer, structural: Not on file    Marital Status: Divorced    Current Medications:  Current Outpatient Medications:    buPROPion  (WELLBUTRIN  XL) 150 MG 24 hr  tablet, Take 150 mg by mouth daily., Disp: , Rfl:    ADVAIR HFA 230-21 MCG/ACT inhaler, INHALE 2 PUFFS INTO THE LUNGS TWICE A DAY, Disp: 12 each, Rfl: 5   albuterol  (VENTOLIN  HFA) 108 (90 Base) MCG/ACT inhaler, TAKE 2 PUFFS BY MOUTH EVERY 6 HOURS AS NEEDED FOR WHEEZE OR SHORTNESS OF BREATH, Disp: 8.5 each, Rfl: 3   benzonatate  (TESSALON ) 100 MG capsule, Take 1 capsule (100 mg total) by mouth 3 (three) times daily as needed., Disp: 30 capsule, Rfl: 0   cholecalciferol (VITAMIN D3) 25 MCG (1000 UNIT) tablet, Take 1,000 Units by mouth daily., Disp: , Rfl:    cyanocobalamin (VITAMIN B12) 1000 MCG tablet, Take 1,000 mcg by mouth daily., Disp: , Rfl:    diclofenac  Sodium (VOLTAREN ) 1 % GEL, Apply 2 g topically 4 (four) times daily. (Patient taking differently: Apply 2 g topically 4 (four) times daily as needed (pain).), Disp: 150 g, Rfl: 0   estradiol  (VIVELLE -DOT) 0.025 MG/24HR, PLACE 1 PATCH ONTO THE SKIN 2 TIMES A WEEK., Disp: 24 patch, Rfl: 5   FLUoxetine  (PROZAC ) 10 MG capsule, Take 1 capsule (10 mg total) by mouth daily. (Patient not taking: Reported on 06/05/2024), Disp: 30 capsule, Rfl: 3   losartan  (COZAAR ) 50 MG tablet, Take 1.5 tablets (75 mg total) by mouth daily., Disp: 135 tablet, Rfl: 1   Magnesium 250 MG TABS, Take 250 mg by mouth daily., Disp: , Rfl:    Misc. Devices MISC, Blood pressure cuff/device - 1. ICD10: I10, Disp: 1 each, Rfl: 0   omeprazole   (PRILOSEC) 40 MG capsule, Take 1 capsule (40 mg total) by mouth daily. (Patient taking differently: Take 40 mg by mouth daily as needed (heartburn).), Disp: 30 capsule, Rfl: 3   Probiotic Product (PROBIOTIC DAILY PO), Take 1 capsule by mouth daily., Disp: , Rfl:    rosuvastatin  (CRESTOR ) 10 MG tablet, TAKE 1 TABLET BY MOUTH EVERY DAY, Disp: 90 tablet, Rfl: 1   senna (SENOKOT) 8.6 MG TABS tablet, Take 1 tablet (8.6 mg total) by mouth at bedtime as needed for mild constipation. (Patient not taking: Reported on 06/05/2024), Disp: 20 tablet, Rfl: 1   sucralfate  (CARAFATE ) 1 g tablet, Take 1 tablet (1 g total) by mouth 4 (four) times daily -  with meals and at bedtime., Disp: 40 tablet, Rfl: 0   traMADol  (ULTRAM ) 50 MG tablet, Take 1 tablet (50 mg total) by mouth every 6 (six) hours as needed for severe pain (pain score 7-10). For AFTER surgery only, do not take and drive (Patient not taking: Reported on 06/05/2024), Disp: 10 tablet, Rfl: 0   vitamin C (ASCORBIC ACID) 250 MG tablet, Take 250 mg by mouth daily., Disp: , Rfl:   Review of Systems: Denies appetite changes, fevers, chills, fatigue, unexplained weight changes. Denies hearing loss, neck lumps or masses, mouth sores, ringing in ears or voice changes. Denies cough or wheezing.  Denies shortness of breath. Denies chest pain or palpitations. Denies leg swelling. Denies abdominal distention, pain, blood in stools, constipation, diarrhea, nausea, vomiting, or early satiety. Denies pain with intercourse, dysuria, frequency, hematuria or incontinence. Denies hot flashes, pelvic pain, vaginal bleeding or vaginal discharge.   Denies joint pain, back pain or muscle pain/cramps. Denies itching, rash, or wounds. Denies dizziness, headaches, numbness or seizures. Denies swollen lymph nodes or glands, denies easy bruising or bleeding. Denies anxiety, depression, confusion, or decreased concentration.  Physical Exam: BP (!) 141/77 (BP Location: Right Arm,  Patient Position: Sitting)   Pulse 74  Temp 98.1 F (36.7 C) (Oral)   Resp 16   Wt 146 lb (66.2 kg)   SpO2 100%   BMI 28.51 kg/m  General: Alert, oriented, no acute distress. HEENT: Posterior oropharynx clear, sclera anicteric.  GU: Surgical incision healing well.  There was dehiscence of the incision with a couple suture still visible.  No erythema, induration, or exudate.  Laboratory & Radiologic Studies: A. PERINEUM, EXCISION:       High-grade squamous intraepithelial lesion (VIN II).       Surgical resection margins are negative for dysplasia.   Assessment & Plan: Katherine [name] is a 58 y.o. 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 Surgery 08/2022. No LVI. Last pap 09/2023, NILM/HR HPV neg. Now s/p WLE for VIN2, margins negative.  Doing well.  Discussed surgical dehiscence, which is quite common in the location of her excision.  Reviewed continued expectations and restrictions.  Discussed pathology with her from surgery.  She was given a copy of her pathology report.  In the setting of negative margins, we will plan on vulvar surveillance every 6 months coinciding with her surveillance visits for history of cervical cancer.  Plan for her to return to see Melissa in 09/2024 for pap/HPV testing. F/u with me in 03/2025.  18 minutes of total time was spent for this patient encounter, including preparation, face-to-face counseling with the patient and coordination of care, and documentation of the encounter.  Comer Dollar, MD  Division of Gynecologic Oncology  Department of Obstetrics and Gynecology  [facility]

## 2024-06-12 ENCOUNTER — Ambulatory Visit: Payer: Medicare HMO

## 2024-06-12 VITALS — Ht 60.0 in | Wt 146.0 lb

## 2024-06-12 DIAGNOSIS — Z0000 Encounter for general adult medical examination without abnormal findings: Secondary | ICD-10-CM

## 2024-06-12 DIAGNOSIS — Z1231 Encounter for screening mammogram for malignant neoplasm of breast: Secondary | ICD-10-CM

## 2024-06-12 NOTE — Progress Notes (Signed)
 Subjective:   Katherine [name] is a 58 y.o. who presents for a Medicare Wellness preventive visit.  As a reminder, Annual Wellness Visits don't include a physical exam, and some assessments may be limited, especially if this visit is performed virtually. We may recommend an in-person follow-up visit with your provider if needed.  Visit Complete: Virtual I connected with  Katherine [name] on 06/12/24 by a audio enabled telemedicine application and verified that I am speaking with the correct person using two identifiers.  Patient Location: Home  Provider Location: Home Office  I discussed the limitations of evaluation and management by telemedicine. The patient expressed understanding and agreed to proceed.  Vital Signs: Because this visit was a virtual/telehealth visit, some criteria may be missing or patient reported. Any vitals not documented were not able to be obtained and vitals that have been documented are patient reported.  VideoDeclined- This patient declined Librarian, academic. Therefore the visit was completed with audio only.  Persons Participating in Visit: Patient.  AWV Questionnaire: Yes: Patient Medicare AWV questionnaire was completed by the patient on 06/08/2024; I have confirmed that all information answered by patient is correct and no changes since this date.      Objective:    Today's Vitals   06/08/24 0916 06/12/24 1016  Weight:  146 lb (66.2 kg)  Height:  5' (1.524 m)  PainSc: 0-No pain    Body mass index is 28.51 kg/m.     06/12/2024   10:14 AM 05/09/2024    5:45 AM 05/03/2024    1:56 PM 06/12/2023    9:14 AM 03/16/2023    9:59 AM 02/22/2023    3:27 PM 08/10/2022    3:20 PM  Advanced Directives  Does Patient Have a Medical Advance Directive? No Yes Yes Yes Yes Yes Yes  Type of Furniture conservator/restorer;Living will Living will;Healthcare Power of [employer] Power of [place];Living will Healthcare Power of  [place];Living will Healthcare Power of [place];Living will Healthcare Power of Attorney  Does patient want to make changes to medical advance directive?  No - Patient declined No - Patient declined No - Patient declined   No - Patient declined  Copy of Healthcare Power of Attorney in Chart?  No - copy requested  No - copy requested     Would patient like information on creating a medical advance directive? Yes (MAU/Ambulatory/Procedural Areas - Information given)          Current Medications (verified) Outpatient Encounter Medications as of 06/12/2024  Medication Sig   ADVAIR HFA 230-21 MCG/ACT inhaler INHALE 2 PUFFS INTO THE LUNGS TWICE A DAY   albuterol  (VENTOLIN  HFA) 108 (90 Base) MCG/ACT inhaler TAKE 2 PUFFS BY MOUTH EVERY 6 HOURS AS NEEDED FOR WHEEZE OR SHORTNESS OF BREATH   benzonatate  (TESSALON ) 100 MG capsule Take 1 capsule (100 mg total) by mouth 3 (three) times daily as needed.   buPROPion  (WELLBUTRIN  XL) 150 MG 24 hr tablet Take 150 mg by mouth daily.   cholecalciferol (VITAMIN D3) 25 MCG (1000 UNIT) tablet Take 1,000 Units by mouth daily.   cyanocobalamin (VITAMIN B12) 1000 MCG tablet Take 1,000 mcg by mouth daily.   diclofenac  Sodium (VOLTAREN ) 1 % GEL Apply 2 g topically 4 (four) times daily. (Patient taking differently: Apply 2 g topically 4 (four) times daily as needed (pain).)   estradiol  (VIVELLE -DOT) 0.025 MG/24HR PLACE 1 PATCH ONTO THE SKIN 2 TIMES A WEEK.   losartan  (COZAAR ) 50 MG  tablet Take 1.5 tablets (75 mg total) by mouth daily.   Magnesium 250 MG TABS Take 250 mg by mouth daily.   Misc. Devices MISC Blood pressure cuff/device - 1. ICD10: I10   omeprazole  (PRILOSEC) 40 MG capsule Take 1 capsule (40 mg total) by mouth daily. (Patient taking differently: Take 40 mg by mouth daily as needed (heartburn).)   Probiotic Product (PROBIOTIC DAILY PO) Take 1 capsule by mouth daily.   rosuvastatin  (CRESTOR ) 10 MG tablet TAKE 1 TABLET BY MOUTH EVERY DAY   sucralfate  (CARAFATE )  1 g tablet Take 1 tablet (1 g total) by mouth 4 (four) times daily -  with meals and at bedtime.   vitamin C (ASCORBIC ACID) 250 MG tablet Take 250 mg by mouth daily.   FLUoxetine  (PROZAC ) 10 MG capsule Take 1 capsule (10 mg total) by mouth daily. (Patient not taking: Reported on 06/12/2024)   senna (SENOKOT) 8.6 MG TABS tablet Take 1 tablet (8.6 mg total) by mouth at bedtime as needed for mild constipation. (Patient not taking: Reported on 06/12/2024)   traMADol  (ULTRAM ) 50 MG tablet Take 1 tablet (50 mg total) by mouth every 6 (six) hours as needed for severe pain (pain score 7-10). For AFTER surgery only, do not take and drive (Patient not taking: Reported on 06/12/2024)   No facility-administered encounter medications on file as of 06/12/2024.    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   [facility]   Pneumonia    Wears dentures    full upper   Wears glasses    Past Surgical History:  Procedure Laterality Date   ABDOMINAL HYSTERECTOMY  2023   CERVICAL CONIZATION W/BX N/A 06/30/2022   Procedure: CONIZATION CERVIX WITH BIOPSY; POST CONE ENDOCERVICAL CURRETTAGE;  Surgeon: Viktoria Comer SAUNDERS, MD;  Location: [facility];  Service: Gynecology;  Laterality: N/A;   CESAREAN SECTION  1993   COLONOSCOPY WITH PROPOFOL   06/21/2022   COLONOSCOPY WITH PROPOFOL  N/A 06/21/2022   Procedure: COLONOSCOPY WITH PROPOFOL ;  Surgeon: Cindie Carlin POUR, DO;  [facility];  Service: Endoscopy;  Laterality: N/A;  8:45  am ASA 2   CYSTOSCOPY WITH RETROGRADE PYELOGRAM, URETEROSCOPY AND STENT PLACEMENT Left 2005   for left upj obstructions (not due to stone)   ESOPHAGOGASTRODUODENOSCOPY (EGD) WITH PROPOFOL  N/A 06/21/2022   Procedure: ESOPHAGOGASTRODUODENOSCOPY (EGD) WITH PROPOFOL ;  Surgeon: Cindie Carlin POUR, DO;  [facility];  Service: Endoscopy;  Laterality: N/A;   EXAM UNDER ANESTHESIA, PELVIC N/A 05/09/2024   Procedure: EXAM UNDER ANESTHESIA, PELVIC;  Surgeon: Viktoria Comer SAUNDERS, MD;  Location: [facility];  Service: Gynecology;  Laterality: N/A;   LAPAROSCOPIC TUBAL LIGATION Bilateral 1998   LYMPH NODE DISSECTION N/A 08/10/2022   Procedure: PELVIC LYMPH NODE DISSECTION;  Surgeon: Viktoria Comer SAUNDERS, MD;  Location: [facility];  Service: Gynecology;  Laterality: N/A;   ORIF ANKLE FRACTURE Right 2010   has retained hardware   POLYPECTOMY  06/21/2022   Procedure: POLYPECTOMY;  Surgeon: Cindie Carlin POUR, DO;  [facility];  Service: Endoscopy;;   RADICAL HYSTERECTOMY N/A 08/10/2022   Procedure: RADICAL HYSTERECTOMY, BILATERAL SALPINGO-OOPHORECTOMY;  Surgeon: Viktoria Comer SAUNDERS, MD;  Location: [facility];  Service: Gynecology;  Laterality: N/A;   TUBAL LIGATION  1998   VULVECTOMY N/A 05/09/2024   Procedure: WIDE EXCISION VULVECTOMY;  Surgeon: Viktoria Comer SAUNDERS, MD;  Location: [facility];  Service: Gynecology;  Laterality: N/A;   Family History  Problem Relation Age of Onset   Hypothyroidism Mother    Hypertension Mother    Anxiety disorder Mother    Arthritis Mother    Hypertension Father    Stroke Father    Hypothyroidism Sister    ADD / ADHD Sister    Asthma Sister    ADD / ADHD Sister    ADD / ADHD Sister    ADD / ADHD Brother    ADD / ADHD Brother    Colon cancer Brother    Depression Daughter    Depression Daughter    Colon polyps Neg Hx    Breast cancer Neg Hx    Ovarian cancer Neg Hx    Endometrial cancer Neg Hx    Pancreatic cancer Neg Hx    Prostate cancer Neg Hx    Social  History   Socioeconomic History   Marital status: Divorced    Spouse name: Not on file   Number of children: 4   Years of education: Not on file   Highest education level: Some college, no degree  Occupational History   Not on file  Tobacco Use   Smoking status: Never   Smokeless tobacco: Never  Vaping Use   Vaping status: Never Used  Substance and Sexual Activity   Alcohol use: Not Currently    Comment: seldom   Drug use: Never   Sexual activity: Not Currently    Partners: Male    Birth control/protection: Surgical  Other Topics Concern   Not on file  Social History Narrative   Lives alone   4 children-one in [place], [place], [place], and [place]  in the [employer]    [place] children 2      Enjoy: spending time with family, children visit often       Diets: eats all food groups    Caffeine: 1 cup coffee twice week   Water: 6-8 cups      Wears seat belt    Does not use phone while driving-handfree   Smoke detectors at home   No weapons in the house    Social Drivers of Health   Financial Resource Strain: Medium Risk (06/12/2024)   Overall Financial Resource Strain (CARDIA)    Difficulty of Paying Living Expenses: Somewhat hard  Food Insecurity: Food Insecurity Present (06/12/2024)   Hunger Vital Sign    Worried About Running Out of Food in the Last Year: Sometimes true    Ran Out of Food in the Last Year: Sometimes true  Transportation Needs: No Transportation Needs (06/12/2024)   PRAPARE - Administrator, Civil Service (Medical): No    Lack of Transportation (Non-Medical): No  Physical Activity: Insufficiently Active (06/12/2024)   Exercise Vital Sign    Days of Exercise per Week: 3 days    Minutes of Exercise per Session: 30 min  Stress: No Stress Concern Present (06/12/2024)   [employer] of Occupational Health - Occupational Stress Questionnaire    Feeling of Stress: Not at all  Recent Concern: Stress - Stress Concern Present (05/24/2024)    [employer] of Occupational Health - Occupational Stress Questionnaire    Feeling of Stress: Rather much  Social Connections: Socially Isolated (06/12/2024)   Social Connection and Isolation Panel    Frequency of Communication with Friends and Family: Once a week    Frequency of Social Gatherings with Friends and Family: Once a week    Attends Religious Services: Never    Database administrator or Organizations: No    Attends Engineer, structural: Never    Marital Status: Divorced    Tobacco Counseling Counseling given: Yes    Clinical Intake:  Pre-visit preparation completed: Yes  Pain : No/denies pain Pain Score: 0-No pain     BMI - recorded: 28.51 Nutritional Status: BMI 25 -29 Overweight Nutritional Risks: None Diabetes: No  Lab Results  Component Value Date   HGBA1C 5.5 05/28/2024   HGBA1C 5.5 04/27/2022     How often do you need to have someone help you when you read instructions, pamphlets, or other written materials from your doctor or pharmacy?: 1 - Never  Interpreter Needed?: No  Information entered by :: Danie Diehl W CMA (AAMA)   Activities of Daily Living     06/08/2024    9:16 AM 05/03/2024    1:58 PM  In your present state of health, do you have any difficulty performing the following activities:  Hearing? 0   Vision? 1   Difficulty concentrating or making decisions? 1   Walking or climbing stairs? 0   Dressing or bathing? 0   Doing errands, shopping? 0 0  Preparing Food and eating ? N   Using the Toilet? N   In the past six months, have you accidently leaked urine? Y   Do you have problems with loss of bowel control? N   Managing your Medications? N   Managing your Finances? N   Housekeeping or managing your Housekeeping? Y     Patient Care Team: Tobie Suzzane POUR, MD as PCP - General (Internal Medicine) Viktoria Comer SAUNDERS, MD as Consulting Physician (Gynecologic Oncology) Magdalen Pasco RAMAN, DPM as Consulting Physician  (Podiatry) Darroll Anes, DO as Consulting Physician (Optometry)  I have updated your Care Teams any recent Medical Services you may have received from other providers in the past year.     Assessment:   This is a routine wellness examination for Katherine [name].  Hearing/Vision screen Hearing Screening - Comments:: Patient denies any hearing difficulties.   Vision Screening - Comments:: Wears rx glasses - up to date with routine eye exams with  Anes Darroll, OD at [facility] [place] location   Goals Addressed             This Visit's Progress    Patient Stated   On track    Stay on top of my health this year!     Patient Stated   On track    Be more active, lose weight, and be healthier.     Prevent falls   On track      Depression Screen     06/12/2024   10:54 AM 05/28/2024   12:56 PM 02/27/2024    2:05 PM 02/27/2024    1:46 PM 06/12/2023    9:15 AM 06/12/2023    9:13 AM 10/25/2022    1:32 PM  PHQ 2/9 Scores  PHQ - 2 Score 0 0 3 0 0 0 0  PHQ- 9 Score 0 0  18 0     Exception Documentation Patient refusal          Fall Risk     06/08/2024    9:16 AM 05/28/2024   12:55 PM 02/27/2024    1:46 PM 06/12/2023    9:14 AM 06/06/2023    9:13 AM  Fall Risk   Falls in the past year? 0 0 0 0 0  Number falls in past yr: 0 0 0 0 0  Injury with Fall? 0 0 0 0 0  Risk for fall due to : No Fall Risks No Fall Risks No Fall Risks No Fall Risks   Follow up Falls evaluation completed;Education provided;Falls prevention discussed Falls evaluation completed Falls evaluation completed Falls evaluation completed     MEDICARE RISK AT HOME:  Medicare Risk at Home Any stairs in or around the home?: (Patient-Rptd) Yes If so, are there any without handrails?: (Patient-Rptd) No Home free of loose throw rugs in walkways, pet beds, electrical cords, etc?: (Patient-Rptd) Yes Adequate lighting in your home to reduce risk of falls?: (Patient-Rptd) Yes Life alert?: (Patient-Rptd) No Use of a cane, walker or  w/c?: (Patient-Rptd) Yes Grab bars in the bathroom?: (Patient-Rptd) Yes Shower chair or bench in shower?: (Patient-Rptd) No Elevated toilet seat or a handicapped toilet?: (Patient-Rptd) No  TIMED UP AND GO:  Was the test performed?  No  Cognitive Function: 6CIT completed    04/21/2021    2:07 PM  MMSE - Mini Mental State Exam  Not completed: Unable to complete        06/12/2024   10:53 AM 06/12/2023    9:15 AM 05/09/2022    8:11 AM 04/21/2021    2:07 PM  6CIT Screen  What Year? 0 points 0 points 0 points 0 points  What month? 0 points 0 points 0 points 0 points  What time? 0 points 0 points 0 points 0 points  Count back from 20 0 points 0 points 0 points 0 points  Months in reverse 0 points 0 points 0 points 0 points  Repeat phrase 0 points 0 points 0 points 0 points  Total Score 0 points 0 points 0 points 0 points    Immunizations Immunization History  Administered Date(s) Administered   PNEUMOCOCCAL CONJUGATE-20 05/28/2024    Screening Tests Health Maintenance  Topic Date Due   COVID-19 Vaccine (1) Never done   DTaP/Tdap/Td (1 - Tdap) Never done   Hepatitis B Vaccines 19-59 Average Risk (1 of 3 - 19+ 3-dose series) Never done   Zoster Vaccines- Shingrix (1 of 2) Never done   Influenza Vaccine  Never done   MAMMOGRAM  05/09/2024   Medicare Annual Wellness (AWV)  06/12/2025   Colonoscopy  06/21/2032   Pneumococcal Vaccine: 50+ Years  Completed   Hepatitis C Screening  Completed   HIV Screening  Completed   HPV VACCINES  Aged Out   Meningococcal B Vaccine  Aged Out    Health Maintenance  Health Maintenance Due  Topic Date Due   COVID-19 Vaccine (1) Never done   DTaP/Tdap/Td (1 - Tdap) Never done   Hepatitis B Vaccines 19-59 Average Risk (1 of 3 - 19+ 3-dose series) Never done   Zoster Vaccines- Shingrix (1 of 2) Never done   Influenza Vaccine  Never done   MAMMOGRAM  05/09/2024   Health Maintenance Items Addressed: Mammogram ordered  Additional  Screening:  Vision Screening: Recommended annual ophthalmology exams for early detection of glaucoma and other disorders of the  eye. Would you like a referral to an eye doctor? No    Dental Screening: Recommended annual dental exams for proper oral hygiene  Community Resource Referral / Chronic Care Management: CRR required this visit?  No   CCM required this visit?  No   Plan:    I have personally reviewed and noted the following in the patient's chart:   Medical and social history Use of alcohol, tobacco or illicit drugs  Current medications and supplements including opioid prescriptions. Patient is not currently taking opioid prescriptions. Functional ability and status Nutritional status Physical activity Advanced directives List of other physicians Hospitalizations, surgeries, and ER visits in previous 12 months Vitals Screenings to include cognitive, depression, and falls Referrals and appointments  In addition, I have reviewed and discussed with patient certain preventive protocols, quality metrics, and best practice recommendations. A written personalized care plan for preventive services as well as general preventive health recommendations were provided to patient.   Lace Chenevert, CMA   06/12/2024   After Visit Summary: (MyChart) Due to this being a telephonic visit, the after visit summary with patients personalized plan was offered to patient via MyChart   Notes: Nothing significant to report at this time.

## 2024-06-12 NOTE — Patient Instructions (Signed)
 Ms. Retz,  Thank you for taking the time for your Medicare Wellness Visit. I appreciate your continued commitment to your health goals. Please review the care plan we discussed, and feel free to reach out if I can assist you further.    Ongoing Care Seeing your primary care provider every 3 to 6 months helps us  monitor your health and provide consistent, personalized care.     Referrals If a referral was made during today's visit and you haven't received any updates within two weeks, please contact the referred provider directly to check on the status.  To Schedule Your Mammogram, Call:  [facility] @  [phone]   Wishing you good health and many blessings for the year to come  -Avenir Lozinski     Medicare recommends these wellness visits once per year to help you and your care team stay ahead of potential health issues. These visits are designed to focus on prevention, allowing your provider to concentrate on managing your acute and chronic conditions during your regular appointments.     Please note that Annual Wellness Visits do not include a physical exam. Some assessments may be limited, especially if the visit was conducted virtually. If needed, we may recommend a separate in-person follow-up with your provider.  Recommended Screenings:  Health Maintenance  Topic Date Due   COVID-19 Vaccine (1) Never done   DTaP/Tdap/Td vaccine (1 - Tdap) Never done   Hepatitis B Vaccine (1 of 3 - 19+ 3-dose series) Never done   Zoster (Shingles) Vaccine (1 of 2) Never done   Flu Shot  Never done   Mammogram  05/09/2024   Medicare Annual Wellness Visit  06/12/2025   Colon Cancer Screening  06/21/2032   Pneumococcal Vaccine for age over 84  Completed   Hepatitis C Screening  Completed   HIV Screening  Completed   HPV Vaccine  Aged Out   Meningitis B Vaccine  Aged Out       06/12/2024   10:14 AM  Advanced Directives  Does Patient Have a Medical Advance Directive? No  Would patient  like information on creating a medical advance directive? Yes (MAU/Ambulatory/Procedural Areas - Information given)   Advance Care Planning is important because it: Ensures you receive medical care that aligns with your values, goals, and preferences. Provides guidance to your family and loved ones, reducing the emotional burden of decision-making during critical moments.    Vision: Annual vision screenings are recommended for early detection of glaucoma, cataracts, and diabetic retinopathy. These exams can also reveal signs of chronic conditions such as diabetes and high blood pressure.    Dental: Annual dental screenings help detect early signs of oral cancer, gum disease, and other conditions linked to overall health, including heart disease and diabetes.  Please see the attached documents for additional preventive care recommendations.

## 2024-07-19 ENCOUNTER — Telehealth: Admitting: Physician Assistant

## 2024-07-19 DIAGNOSIS — R3989 Other symptoms and signs involving the genitourinary system: Secondary | ICD-10-CM | POA: Diagnosis not present

## 2024-07-19 MED ORDER — CEPHALEXIN 500 MG PO CAPS: 500.0000 mg | ORAL_CAPSULE | Freq: Two times a day (BID) | ORAL | 0 refills | Status: AC

## 2024-07-19 NOTE — Progress Notes (Signed)

## 2024-08-02 ENCOUNTER — Other Ambulatory Visit: Payer: Self-pay | Admitting: Internal Medicine

## 2024-08-02 DIAGNOSIS — F339 Major depressive disorder, recurrent, unspecified: Secondary | ICD-10-CM

## 2024-08-04 ENCOUNTER — Telehealth: Admitting: Family

## 2024-08-04 DIAGNOSIS — K2970 Gastritis, unspecified, without bleeding: Secondary | ICD-10-CM

## 2024-08-04 MED ORDER — PROMETHAZINE HCL 25 MG PO TABS: 25.0000 mg | ORAL_TABLET | Freq: Three times a day (TID) | ORAL | 0 refills | Status: AC | PRN

## 2024-08-04 NOTE — Progress Notes (Signed)
 We are sorry that you are not feeling well. Here is how we plan to help!  Based on what you have shared with me it looks like you have a Virus that is irritating your GI tract.  Vomiting is the forceful emptying of a portion of the stomach's content through the mouth.  Although nausea and vomiting can make you feel miserable, it's important to remember that these are not diseases, but rather symptoms of an underlying illness.  When we treat short term symptoms, we always caution that any symptoms that persist should be fully evaluated in a medical office.  I have prescribed a medication that will help alleviate your symptoms and allow you to stay hydrated:  Promethazine  25 mg take 1 tablet twice daily  HOME CARE: Drink clear liquids.  This is very important! Dehydration (the lack of fluid) can lead to a serious complication.  Start off with 1 tablespoon every 5 minutes for 8 hours. You may begin eating bland foods after 8 hours without vomiting.  Start with saltine crackers, white bread, rice, mashed potatoes, applesauce. After 48 hours on a bland diet, you may resume a normal diet. Try to go to sleep.  Sleep often empties the stomach and relieves the need to vomit.  GET HELP RIGHT AWAY IF:  Your symptoms do not improve or worsen within 2 days after treatment. You have a fever for over 3 days. You cannot keep down fluids after trying the medication.  MAKE SURE YOU:  Understand these instructions. Will watch your condition. Will get help right away if you are not doing well or get worse.   Thank you for choosing an e-visit. Your e-visit answers were reviewed by a board certified advanced clinical practitioner to complete your personal care plan. Depending upon the condition, your plan could have included both over the counter or prescription medications. Please review your pharmacy choice. Be sure that the pharmacy you have chosen is open so that you can pick up your prescription now.  If  there is a problem you may message your provider in MyChart to have the prescription routed to another pharmacy. Your safety is important to us . If you have drug allergies check your prescription carefully.  For the next 24 hours, you can use MyChart to ask questions about today's visit, request a non-urgent call back, or ask for a work or school excuse from your e-visit provider. You will get an e-mail in the next two days asking about your experience. I hope that your e-visit has been valuable and will speed your recovery.  I have spent 5 minutes in review of e-visit questionnaire, review and updating patient chart, medical decision making and response to patient.   Bari Learn, FNP

## 2024-08-05 ENCOUNTER — Encounter: Payer: Self-pay | Admitting: Radiology

## 2024-08-07 ENCOUNTER — Encounter: Payer: Self-pay | Admitting: Internal Medicine

## 2024-08-15 ENCOUNTER — Ambulatory Visit: Admitting: Podiatry

## 2024-08-15 ENCOUNTER — Encounter: Payer: Self-pay | Admitting: Podiatry

## 2024-08-15 DIAGNOSIS — M7751 Other enthesopathy of right foot: Secondary | ICD-10-CM

## 2024-08-15 MED ORDER — TRIAMCINOLONE ACETONIDE 10 MG/ML IJ SUSP
10.0000 mg | Freq: Once | INTRAMUSCULAR | Status: AC
  Administered 2024-08-15: 10 mg via INTRA_ARTICULAR

## 2024-08-17 NOTE — Progress Notes (Signed)
 Subjective:   Patient ID: Katherine [name], female   DOB: 58 y.o.   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

## 2024-09-10 ENCOUNTER — Ambulatory Visit: Admitting: Internal Medicine

## 2024-09-16 ENCOUNTER — Encounter: Payer: Self-pay | Admitting: Gynecologic Oncology

## 2024-09-16 NOTE — Progress Notes (Unsigned)
 Gynecologic Oncology Return Clinic Visit  09/17/2024  Reason for Visit: follow-up  Treatment History: Oncology History Overview Note  Pap test was performed on 05/23/22 and showed squamous cell carcinoma, + HPV 16.   Malignant neoplasm of cervix (HCC)  06/07/2022 Initial Diagnosis   Malignant neoplasm of cervix (HCC)   06/17/2022 Initial Biopsy   Cervical biopsies, ECC  A. 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 CERVIX, 12:00, BIOPSY:  Microinvasive squamous cell carcinoma (0.25 mm depth) arising within  severe dysplasia to carcinoma in situ  Marked chronic and follicular cervicitis with squamous metaplasia   C. ENDOCERVICAL, CURRETAGE:  Numerous detached fragments of squamous cell carcinoma and severe  squamous dysplasia    06/29/2022 Imaging   PET: 1. Very low-grade activity in the vicinity of the cervix, maximum SUV 4.1, possibly reflecting the cervical malignancy. No findings of metastatic disease. 2. Small but hypermetabolic paratracheal, bilateral hilar/infrahilar, and subcarinal lymph nodes, at least one of which is partially calcified, raising some suspicion for granulomatous process such as sarcoidosis, although strictly speaking, lymphoma is not excluded. 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 3. Accentuated palatine tonsillar uptake bilaterally, probably physiologic but technically nonspecific. There is also a small but mildly hypermetabolic left level IIa lymph node with maximum SUV of 4.5. 4. Other imaging findings of potential clinical significance: Acute on chronic paranasal sinusitis. Mild cardiomegaly. Simple  appearing adnexal cysts warrant no further workup.   06/30/2022 Surgery   [facility], ECC  A. CERVIX, CONE:  - Invasive moderately differentiated squamous cell carcinoma arising in  background of extensive carcinoma in situ involving underlying  endocervical glands  - Invasive carcinoma is present in 6-9 and 9-12 o'clock quadrants while  12-3 o'clock and 3-6 o'clock quadrants show presence of carcinoma in  situ  - Carcinoma invades for a depth of at least 0.5 cm (6-9 o'clock  quadrant)  - The deep resection margin is widely positive for carcinoma (6-9  o'clock quadrant)  - All lateral mucosal margins are negative for invasive carcinoma  - Ecto- and endocervical margins in 12-3 o'clock, 3-6 o'clock and 6-9  o'clock quadrants are positive for carcinoma in situ; mucosal margins in  the 9-12 o'clock quadrants are negative   B. ENDOCERVIX, POST CONE, CURETTAGE:  - At least squamous cell carcinoma in situ  - Focal invasive carcinoma cannot be ruled out     Surgery   Type III radical hysterectomy with bilateral salpingoophorectomy and bilateral pelvic lymphadenectomy   On EUA, well-healed cervix, mildly firm suspected secondary to recent cold knife cone procedure.  On intra-abdominal entry, normal upper abdominal survey, no enlarged para-aortic lymph nodes on palpation.  Normal-appearing small and large bowel.  Uterus approximately 6 cm and normal in appearance.  Normal bilateral tubes and ovaries.  No obvious adenopathy with very little lymphatic tissue in the pelvic basins.     Pathologic Stage   A. LYMPH NODE, LEFT PELVIC, EXCISION: - Negative for carcinoma (0/1)  B. LYMPH NODE, RIGHT PELVIC, EXCISION: - Negative for carcinoma (0/1)  C. 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 POSTERIOR MARGIN: - Negative for dysplasia or carcinoma  E. ANTERIOR MARGIN: - Negative for dysplasia or carcinoma  COMMENT:  Multiple levels were examined in the sections from the cervix to rule out microinvasion, and no foci of invasion were identified. Appropriately controlled p16 immunohistochemical stain is positive in the tumor. Slides from part C were reviewed with Dr. Belvie who agrees with the above interpretation.      Interval History: Patient presents today to the office for continued follow up. She reports overall doing well.  She just celebrated her 58th birthday with family. Denies any significant pain. Bladder frequency has improved since increasing the dose of her losartan . She was previously getting up 3-4 times a night and now she gets up once and is able to fall back asleep. She monitors her BP at home and it still remains slightly elevated. She has follow up next week with her PCP. She has mild edema intermittently in her right leg from a past injury. She is asking about potentially increasing the estrogen patch dosing to see how her mood is. She has also started on Wellbutrin  around 6 months ago.   Past Medical/Surgical History: Past Medical History:  Diagnosis Date   Allergy    Amoxicillin    Arthritis    Asthma, mild    followed by pcp   Chronic kidney disease 2008   surgery for Kidney blockage   Depression    GAD (generalized anxiety disorder)    GERD (gastroesophageal reflux disease)    History of hiatal hernia    History of obstruction of ureter 2005   s/p  left ureteral stent (per pt not due to stone,  resolved)   Hypertension 2023   Malignant neoplasm cervix  (HCC) 06/07/2022   SCC   Pneumonia    Wears dentures    full upper   Wears glasses     Past Surgical History:  Procedure Laterality Date   ABDOMINAL HYSTERECTOMY  2023   CERVICAL CONIZATION W/BX N/A 06/30/2022   Procedure: CONIZATION CERVIX WITH BIOPSY; POST CONE ENDOCERVICAL CURRETTAGE;  Surgeon: Viktoria Comer SAUNDERS, MD;  Location: [facility];  Service: Gynecology;  Laterality: N/A;   CESAREAN SECTION  1993   COLONOSCOPY WITH PROPOFOL   06/21/2022   COLONOSCOPY WITH PROPOFOL  N/A 06/21/2022   Procedure: COLONOSCOPY WITH PROPOFOL ;  Surgeon: Cindie Carlin POUR, DO;  Location: [facility];  Service: Endoscopy;  Laterality: N/A;  8:45 am ASA 2   CYSTOSCOPY WITH RETROGRADE PYELOGRAM, URETEROSCOPY AND STENT PLACEMENT Left 2005   for left upj obstructions (not due to stone)   ESOPHAGOGASTRODUODENOSCOPY (EGD) WITH PROPOFOL  N/A 06/21/2022   Procedure: ESOPHAGOGASTRODUODENOSCOPY (EGD) WITH PROPOFOL ;  Surgeon: Cindie Carlin POUR, DO;  Location: [facility];  Service: Endoscopy;  Laterality: N/A;   EXAM UNDER ANESTHESIA, PELVIC N/A 05/09/2024   Procedure: EXAM UNDER ANESTHESIA, PELVIC;  Surgeon: Viktoria Comer SAUNDERS, MD;  Location: [facility];  Service: Gynecology;  Laterality: N/A;   LAPAROSCOPIC TUBAL LIGATION Bilateral 1998   LYMPH NODE DISSECTION N/A 08/10/2022   Procedure: PELVIC LYMPH NODE DISSECTION;  Surgeon: Viktoria Comer SAUNDERS, MD;  Location: [facility];  Service: Gynecology;  Laterality: N/A;   ORIF ANKLE FRACTURE Right 2010  has retained hardware   POLYPECTOMY  06/21/2022   Procedure: POLYPECTOMY;  Surgeon: Cindie Carlin POUR, DO;  Location: [facility];  Service: Endoscopy;;   RADICAL HYSTERECTOMY N/A 08/10/2022   Procedure: RADICAL HYSTERECTOMY, BILATERAL SALPINGO-OOPHORECTOMY;  Surgeon: Viktoria Comer SAUNDERS, MD;  Location: [facility];  Service: Gynecology;  Laterality: N/A;   TUBAL LIGATION  1998   VULVECTOMY N/A 05/09/2024   Procedure: WIDE EXCISION VULVECTOMY;  Surgeon:  Viktoria Comer SAUNDERS, MD;  Location: [facility];  Service: Gynecology;  Laterality: N/A;    Family History  Problem Relation Age of Onset   Hypothyroidism Mother    Hypertension Mother    Anxiety disorder Mother    Arthritis Mother    Hypertension Father    Stroke Father    Hypothyroidism Sister    ADD / ADHD Sister    Asthma Sister    ADD / ADHD Sister    ADD / ADHD Sister    ADD / ADHD Brother    ADD / ADHD Brother    Colon cancer Brother    Depression Daughter    Depression Daughter    Colon polyps Neg Hx    Breast cancer Neg Hx    Ovarian cancer Neg Hx    Endometrial cancer Neg Hx    Pancreatic cancer Neg Hx    Prostate cancer Neg Hx     Social History   Socioeconomic History   Marital status: Divorced    Spouse name: Not on file   Number of children: 4   Years of education: Not on file   Highest education level: Some college, no degree  Occupational History   Not on file  Tobacco Use   Smoking status: Never   Smokeless tobacco: Never  Vaping Use   Vaping status: Never Used  Substance and Sexual Activity   Alcohol use: Not Currently    Comment: seldom   Drug use: Never   Sexual activity: Not Currently    Partners: Male    Birth control/protection: Surgical  Other Topics Concern   Not on file  Social History Narrative   Lives alone   4 children-one in [place], [place], [place], and [place]  in the [employer]    [place] children 2      Enjoy: spending time with family, children visit often       Diets: eats all food groups    Caffeine: 1 cup coffee twice week   Water: 6-8 cups      Wears seat belt    Does not use phone while driving-handfree   Smoke detectors at home   No weapons in the house    Social Drivers of Health   Tobacco Use: Low Risk (08/15/2024)   Patient History    Smoking Tobacco Use: Never    Smokeless Tobacco Use: Never    Passive Exposure: Not on file  Financial Resource Strain: Medium Risk (06/12/2024)   Overall Financial Resource  Strain (CARDIA)    Difficulty of Paying Living Expenses: Somewhat hard  Food Insecurity: Food Insecurity Present (06/12/2024)   Epic    Worried About Programme Researcher, Broadcasting/film/video in the Last Year: Sometimes true    Ran Out of Food in the Last Year: Sometimes true  Transportation Needs: No Transportation Needs (06/12/2024)   Epic    Lack of Transportation (Medical): No    Lack of Transportation (Non-Medical): No  Physical Activity: Insufficiently Active (06/12/2024)   Exercise Vital Sign    Days of Exercise per Week: 3  days    Minutes of Exercise per Session: 30 min  Stress: No Stress Concern Present (06/12/2024)   [employer] of Occupational Health - Occupational Stress Questionnaire    Feeling of Stress: Not at all  Recent Concern: Stress - Stress Concern Present (05/24/2024)   [employer] of Occupational Health - Occupational Stress Questionnaire    Feeling of Stress: Rather much  Social Connections: Socially Isolated (06/12/2024)   Social Connection and Isolation Panel    Frequency of Communication with Friends and Family: Once a week    Frequency of Social Gatherings with Friends and Family: Once a week    Attends Religious Services: Never    Database Administrator or Organizations: No    Attends Banker Meetings: Never    Marital Status: Divorced  Depression (PHQ2-9): Low Risk (06/12/2024)   Depression (PHQ2-9)    PHQ-2 Score: 0  Alcohol Screen: Low Risk (06/12/2024)   Alcohol Screen    Last Alcohol Screening Score (AUDIT): 0  Housing: Low Risk (06/12/2024)   Epic    Unable to Pay for Housing in the Last Year: No    Number of Times Moved in the Last Year: 0    Homeless in the Last Year: No  Utilities: Not At Risk (06/12/2024)   Epic    Threatened with loss of utilities: No  Health Literacy: Adequate Health Literacy (06/12/2024)   B1300 Health Literacy    Frequency of need for help with medical instructions: Never    Current Medications:  Current Outpatient  Medications:    ADVAIR HFA 230-21 MCG/ACT inhaler, INHALE 2 PUFFS INTO THE LUNGS TWICE A DAY, Disp: 12 each, Rfl: 5   albuterol  (VENTOLIN  HFA) 108 (90 Base) MCG/ACT inhaler, TAKE 2 PUFFS BY MOUTH EVERY 6 HOURS AS NEEDED FOR WHEEZE OR SHORTNESS OF BREATH, Disp: 8.5 each, Rfl: 3   benzonatate  (TESSALON ) 100 MG capsule, Take 1 capsule (100 mg total) by mouth 3 (three) times daily as needed., Disp: 30 capsule, Rfl: 0   buPROPion  (WELLBUTRIN  XL) 150 MG 24 hr tablet, Take 150 mg by mouth daily., Disp: , Rfl:    cholecalciferol (VITAMIN D3) 25 MCG (1000 UNIT) tablet, Take 1,000 Units by mouth daily., Disp: , Rfl:    cyanocobalamin (VITAMIN B12) 1000 MCG tablet, Take 1,000 mcg by mouth daily., Disp: , Rfl:    diclofenac  Sodium (VOLTAREN ) 1 % GEL, Apply 2 g topically 4 (four) times daily., Disp: 150 g, Rfl: 0   estradiol  (VIVELLE -DOT) 0.025 MG/24HR, PLACE 1 PATCH ONTO THE SKIN 2 TIMES A WEEK., Disp: 24 patch, Rfl: 5   losartan  (COZAAR ) 50 MG tablet, Take 1.5 tablets (75 mg total) by mouth daily., Disp: 135 tablet, Rfl: 1   Magnesium 250 MG TABS, Take 250 mg by mouth daily., Disp: , Rfl:    omeprazole  (PRILOSEC) 40 MG capsule, Take 1 capsule (40 mg total) by mouth daily., Disp: 30 capsule, Rfl: 3   Probiotic Product (PROBIOTIC DAILY PO), Take 1 capsule by mouth daily., Disp: , Rfl:    promethazine  (PHENERGAN ) 25 MG tablet, Take 1 tablet (25 mg total) by mouth every 8 (eight) hours as needed for nausea or vomiting., Disp: 20 tablet, Rfl: 0   rosuvastatin  (CRESTOR ) 10 MG tablet, TAKE 1 TABLET BY MOUTH EVERY DAY, Disp: 90 tablet, Rfl: 1   sucralfate  (CARAFATE ) 1 g tablet, Take 1 tablet (1 g total) by mouth 4 (four) times daily -  with meals and at bedtime., Disp: 40 tablet, Rfl:  0   vitamin C (ASCORBIC ACID) 250 MG tablet, Take 250 mg by mouth daily., Disp: , Rfl:    Misc. Devices MISC, Blood pressure cuff/device - 1. ICD10: I10, Disp: 1 each, Rfl: 0  Review of Systems: Denies appetite changes, fevers,  chills, fatigue, unexplained weight changes. Denies hearing loss, neck lumps or masses, mouth sores, ringing in ears or voice changes. Denies cough or wheezing.  Denies shortness of breath. Denies chest pain or palpitations. Denies leg swelling. Denies abdominal distention, pain, blood in stools, constipation, diarrhea, nausea, vomiting, or early satiety. Denies pain with intercourse, dysuria, frequency, hematuria or incontinence. Denies hot flashes, pelvic pain, vaginal bleeding or vaginal discharge.   Denies joint pain, back pain or muscle pain/cramps. Denies itching, rash, or wounds. Denies dizziness, headaches, numbness or seizures. Denies swollen lymph nodes or glands, denies easy bruising or bleeding. Denies anxiety, depression, confusion, or decreased concentration.  Physical Exam: BP (!) 148/92 Comment: manual recheck, NP notified  Pulse 67   Temp 98 F (36.7 C) (Oral)   Resp 16   Ht 5' (1.524 m)   Wt 149 lb (67.6 kg)   SpO2 99%   BMI 29.10 kg/m  General: Alert, oriented, no acute distress. HEENT: Posterior oropharynx clear, sclera anicteric.  GU: Surgical incision healing well.  There was dehiscence of the incision with a couple suture still visible.  No erythema, induration, or exudate.  Laboratory & Radiologic Studies: A. PERINEUM, EXCISION:       High-grade squamous intraepithelial lesion (VIN II).       Surgical resection margins are negative for dysplasia.   Assessment & Plan: Katherine [name] is a 58 y.o. 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 Surgery 08/2022. No LVI. Last pap 09/2023, NILM/HR HPV neg. Now s/p WLE for VIN2, margins negative.  Doing well.  Discussed surgical dehiscence, which is quite common in the location of her excision.  Reviewed continued expectations and restrictions.  Discussed pathology with her from surgery.  She was given a  copy of her pathology report.  In the setting of negative margins, we will plan on vulvar surveillance every 6 months coinciding with her surveillance visits for history of cervical cancer.  Plan for her to return to see Rhaelyn Giron in 09/2024 for pap/HPV testing. F/u with me in 03/2025.  18 minutes of total time was spent for this patient encounter, including preparation, face-to-face counseling with the patient and coordination of care, and documentation of the encounter.  Comer Dollar, MD  Division of Gynecologic Oncology  Department of Obstetrics and Gynecology  [facility]

## 2024-09-16 NOTE — Patient Instructions (Signed)
 It was good seeing you today. No concerning findings on today's exam.   Today we obtained a pap smear with HPV testing. You will be contacted with the results.  We also took a biopsy from the top of the vagina and the right vulva. You may have some light spotting after this but please call for heavy bleeding. We will call you with the results of these biopsies. Silver nitrate was applied to the areas that were biopsies so you may see a light grayish discharge.   Plan to follow up with Dr. Viktoria in six months or sooner if needed.  Symptoms to report to your health care team include vaginal bleeding, rectal bleeding, bloating, weight loss without effort, new and persistent pain, new and  persistent fatigue, new leg swelling, new masses (i.e., bumps in your neck or groin), new and persistent cough, new and persistent nausea and vomiting, change in bowel or bladder habits, and any other concerns.

## 2024-09-17 ENCOUNTER — Inpatient Hospital Stay: Attending: Gynecologic Oncology | Admitting: Gynecologic Oncology

## 2024-09-17 VITALS — BP 148/92 | HR 67 | Temp 98.0°F | Resp 16 | Ht 60.0 in | Wt 149.0 lb

## 2024-09-17 DIAGNOSIS — Z79899 Other long term (current) drug therapy: Secondary | ICD-10-CM | POA: Diagnosis not present

## 2024-09-17 DIAGNOSIS — N898 Other specified noninflammatory disorders of vagina: Secondary | ICD-10-CM

## 2024-09-17 DIAGNOSIS — Z90710 Acquired absence of both cervix and uterus: Secondary | ICD-10-CM

## 2024-09-17 DIAGNOSIS — Z01419 Encounter for gynecological examination (general) (routine) without abnormal findings: Secondary | ICD-10-CM | POA: Diagnosis not present

## 2024-09-17 DIAGNOSIS — Z90722 Acquired absence of ovaries, bilateral: Secondary | ICD-10-CM

## 2024-09-17 DIAGNOSIS — R600 Localized edema: Secondary | ICD-10-CM | POA: Diagnosis not present

## 2024-09-17 DIAGNOSIS — Z1151 Encounter for screening for human papillomavirus (HPV): Secondary | ICD-10-CM | POA: Insufficient documentation

## 2024-09-17 DIAGNOSIS — Z8541 Personal history of malignant neoplasm of cervix uteri: Secondary | ICD-10-CM | POA: Insufficient documentation

## 2024-09-17 DIAGNOSIS — C539 Malignant neoplasm of cervix uteri, unspecified: Secondary | ICD-10-CM

## 2024-09-17 DIAGNOSIS — N9089 Other specified noninflammatory disorders of vulva and perineum: Secondary | ICD-10-CM

## 2024-09-18 ENCOUNTER — Telehealth: Payer: Self-pay | Admitting: *Deleted

## 2024-09-18 ENCOUNTER — Other Ambulatory Visit: Payer: Self-pay | Admitting: Gynecologic Oncology

## 2024-09-18 DIAGNOSIS — N951 Menopausal and female climacteric states: Secondary | ICD-10-CM

## 2024-09-18 MED ORDER — ESTRADIOL 0.0375 MG/24HR TD PTTW: 1.0000 | MEDICATED_PATCH | TRANSDERMAL | 12 refills | Status: DC

## 2024-09-18 NOTE — Telephone Encounter (Signed)
 Spoke with Ms. Leib in regards to her estrogen patch and she would like to increase her dose from .025 mg to .0375 before going up to .05 mg. Advised patient her message will be relayed to provider. Pt thanked the office for calling.

## 2024-09-19 ENCOUNTER — Ambulatory Visit: Payer: Self-pay | Admitting: *Deleted

## 2024-09-19 LAB — SURGICAL PATHOLOGY

## 2024-09-19 NOTE — Telephone Encounter (Signed)
 Spoke with patient and relayed result message from provider. Good News! No precancer or cancer on biopsies. We will continue to monitor things closely at follow up. Pt was grateful for this good news and thanked the office for calling.

## 2024-09-19 NOTE — Telephone Encounter (Signed)
-----   Message from Eleanor Epps, NP sent at 09/19/2024  1:10 PM EST ----- Good news! Please let her know no precancer or cancer on biopsies. We will continue to monitor things closely at follow up.

## 2024-09-20 ENCOUNTER — Ambulatory Visit: Admitting: Gynecologic Oncology

## 2024-09-23 LAB — CYTOLOGY - PAP
Comment: NEGATIVE
Diagnosis: NEGATIVE
High risk HPV: NEGATIVE

## 2024-10-14 ENCOUNTER — Telehealth: Admitting: Physician Assistant

## 2024-10-14 DIAGNOSIS — B9689 Other specified bacterial agents as the cause of diseases classified elsewhere: Secondary | ICD-10-CM | POA: Diagnosis not present

## 2024-10-14 DIAGNOSIS — J069 Acute upper respiratory infection, unspecified: Secondary | ICD-10-CM

## 2024-10-14 MED ORDER — PROMETHAZINE-DM 6.25-15 MG/5ML PO SYRP: 5.0000 mL | ORAL_SOLUTION | Freq: Four times a day (QID) | ORAL | 0 refills | Status: AC | PRN

## 2024-10-14 MED ORDER — AZITHROMYCIN 250 MG PO TABS: ORAL_TABLET | ORAL | 0 refills | Status: AC

## 2024-10-14 NOTE — Progress Notes (Signed)
 " Virtual Visit Consent   Katherine [name], you are scheduled for a virtual visit with a [facility] provider today. Just as with appointments in the office, your consent must be obtained to participate. Your consent will be active for this visit and any virtual visit you may have with one of our providers in the next 365 days. If you have a MyChart account, a copy of this consent can be sent to you electronically.  As this is a virtual visit, video technology does not allow for your provider to perform a traditional examination. This may limit your provider's ability to fully assess your condition. If your provider identifies any concerns that need to be evaluated in person or the need to arrange testing (such as labs, EKG, etc.), we will make arrangements to do so. Although advances in technology are sophisticated, we cannot ensure that it will always work on either your end or our end. If the connection with a video visit is poor, the visit may have to be switched to a telephone visit. With either a video or telephone visit, we are not always able to ensure that we have a secure connection.  By engaging in this virtual visit, you consent to the provision of healthcare and authorize for your insurance to be billed (if applicable) for the services provided during this visit. Depending on your insurance coverage, you may receive a charge related to this service.  I need to obtain your verbal consent now. Are you willing to proceed with your visit today? Katherine [name] has provided verbal consent on 10/14/2024 for a virtual visit (video or telephone). Delon CHRISTELLA Dickinson, PA-C  Date: 10/14/2024 3:39 PM   Virtual Visit via Video Note   I, Delon CHRISTELLA Dickinson, connected with  Katherine [name]  ([id number], Jul 17, 1966) on 10/14/2024 at  3:30 PM EST by a video-enabled telemedicine application and verified that I am speaking with the correct person using two identifiers.  Location: Patient: Virtual Visit Location  Patient: Home Provider: Virtual Visit Location Provider: Home Office   I discussed the limitations of evaluation and management by telemedicine and the availability of in person appointments. The patient expressed understanding and agreed to proceed.    History of Present Illness: Katherine [name] is a 59 y.o. who identifies as a female who was assigned female at birth, and is being seen today for cough and congestion.  HPI: Cough This is a new problem. The current episode started 1 to 4 weeks ago (over a week). The problem has been gradually worsening. The problem occurs every few minutes. The cough is Productive of sputum and productive of purulent sputum. Associated symptoms include chills, a fever (subjective over the weekend), headaches, myalgias, nasal congestion, postnasal drip, rhinorrhea, a sore throat and sweats. Pertinent negatives include no ear congestion, ear pain or wheezing. Associated symptoms comments: Decreased quality sleep, tinnitus bilateral. The symptoms are aggravated by lying down. Treatments tried: cough drops, netipot, flonase, robitussin, nyquil, tylenol . The treatment provided no relief. Her past medical history is significant for bronchitis and pneumonia.     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   Right foot pain 02/28/2024   History of fracture of foot 02/27/2024   Prolapsed internal hemorrhoids, grade 2 09/08/2022   Tinnitus of both ears 08/29/2022   Mixed hyperlipidemia 08/29/2022   Iron deficiency anemia 08/29/2022   CIN III (cervical intraepithelial neoplasia grade III) with severe dysplasia  Malignant neoplasm of cervix (HCC) 06/07/2022   Hemorrhoids 05/24/2022   Encounter for general adult medical examination with abnormal findings 04/27/2022   Depression, recurrent 04/27/2022   GERD (gastroesophageal reflux disease) 11/09/2021   Essential hypertension 04/21/2020    Dysuria 04/21/2020   Mild persistent asthma 04/21/2020    Allergies: Allergies[1] Medications: Current Medications[2]  Observations/Objective: Patient is well-developed, well-nourished in no acute distress.  Resting comfortably at home.  Head is normocephalic, atraumatic.  No labored breathing.  Speech is clear and coherent with logical content.  Patient is alert and oriented at baseline.    Assessment and Plan: 1. Bacterial upper respiratory infection (Primary) - azithromycin  (ZITHROMAX ) 250 MG tablet; Take 2 tablets on day 1, then 1 tablet daily on days 2 through 5  Dispense: 6 tablet; Refill: 0 - promethazine -dextromethorphan (PROMETHAZINE -DM) 6.25-15 MG/5ML syrup; Take 5 mLs by mouth 4 (four) times daily as needed.  Dispense: 118 mL; Refill: 0  - Worsening over a week despite OTC medications - Will treat with Z-pack and Promethazine  DM - Can continue Mucinex  - Push fluids.  - Rest.  - Steam and humidifier can help - Seek in person evaluation if worsening or symptoms fail to improve    Follow Up Instructions: I discussed the assessment and treatment plan with the patient. The patient was provided an opportunity to ask questions and all were answered. The patient agreed with the plan and demonstrated an understanding of the instructions.  A copy of instructions were sent to the patient via MyChart unless otherwise noted below.    The patient was advised to call back or seek an in-person evaluation if the symptoms worsen or if the condition fails to improve as anticipated.    Delon CHRISTELLA Dickinson, PA-C     [1]  Allergies Allergen Reactions   Amoxicillin  Hives  [2]  Current Outpatient Medications:    azithromycin  (ZITHROMAX ) 250 MG tablet, Take 2 tablets on day 1, then 1 tablet daily on days 2 through 5, Disp: 6 tablet, Rfl: 0   promethazine -dextromethorphan (PROMETHAZINE -DM) 6.25-15 MG/5ML syrup, Take 5 mLs by mouth 4 (four) times daily as needed., Disp: 118 mL, Rfl:  0   ADVAIR HFA 230-21 MCG/ACT inhaler, INHALE 2 PUFFS INTO THE LUNGS TWICE A DAY, Disp: 12 each, Rfl: 5   albuterol  (VENTOLIN  HFA) 108 (90 Base) MCG/ACT inhaler, TAKE 2 PUFFS BY MOUTH EVERY 6 HOURS AS NEEDED FOR WHEEZE OR SHORTNESS OF BREATH, Disp: 8.5 each, Rfl: 3   benzonatate  (TESSALON ) 100 MG capsule, Take 1 capsule (100 mg total) by mouth 3 (three) times daily as needed., Disp: 30 capsule, Rfl: 0   buPROPion  (WELLBUTRIN  XL) 150 MG 24 hr tablet, Take 150 mg by mouth daily., Disp: , Rfl:    cholecalciferol (VITAMIN D3) 25 MCG (1000 UNIT) tablet, Take 1,000 Units by mouth daily., Disp: , Rfl:    cyanocobalamin (VITAMIN B12) 1000 MCG tablet, Take 1,000 mcg by mouth daily., Disp: , Rfl:    diclofenac  Sodium (VOLTAREN ) 1 % GEL, Apply 2 g topically 4 (four) times daily., Disp: 150 g, Rfl: 0   estradiol  (VIVELLE -DOT) 0.0375 MG/24HR, Place 1 patch onto the skin 2 (two) times a week., Disp: 8 patch, Rfl: 12   losartan  (COZAAR ) 50 MG tablet, Take 1.5 tablets (75 mg total) by mouth daily., Disp: 135 tablet, Rfl: 1   Magnesium 250 MG TABS, Take 250 mg by mouth daily., Disp: , Rfl:    Misc. Devices MISC, Blood pressure cuff/device - 1. ICD10: I10,  Disp: 1 each, Rfl: 0   omeprazole  (PRILOSEC) 40 MG capsule, Take 1 capsule (40 mg total) by mouth daily., Disp: 30 capsule, Rfl: 3   Probiotic Product (PROBIOTIC DAILY PO), Take 1 capsule by mouth daily., Disp: , Rfl:    promethazine  (PHENERGAN ) 25 MG tablet, Take 1 tablet (25 mg total) by mouth every 8 (eight) hours as needed for nausea or vomiting., Disp: 20 tablet, Rfl: 0   rosuvastatin  (CRESTOR ) 10 MG tablet, TAKE 1 TABLET BY MOUTH EVERY DAY, Disp: 90 tablet, Rfl: 1   sucralfate  (CARAFATE ) 1 g tablet, Take 1 tablet (1 g total) by mouth 4 (four) times daily -  with meals and at bedtime., Disp: 40 tablet, Rfl: 0   vitamin C (ASCORBIC ACID) 250 MG tablet, Take 250 mg by mouth daily., Disp: , Rfl:   "

## 2024-10-14 NOTE — Patient Instructions (Signed)
 " [name], thank you for joining Delon CHRISTELLA Dickinson, PA-C for today's virtual visit.  While this provider is not your primary care provider (PCP), if your PCP is located in our provider database this encounter information will be shared with them immediately following your visit.   A [facility] MyChart account gives you access to today's visit and all your visits, tests, and labs performed at [facility]  click here if you don't have a [facility] MyChart account or go to [email or web address]  Consent: (Patient) Fadia Marlar provided verbal consent for this virtual visit at the beginning of the encounter.  Current Medications:  Current Outpatient Medications:    azithromycin  (ZITHROMAX ) 250 MG tablet, Take 2 tablets on day 1, then 1 tablet daily on days 2 through 5, Disp: 6 tablet, Rfl: 0   promethazine -dextromethorphan (PROMETHAZINE -DM) 6.25-15 MG/5ML syrup, Take 5 mLs by mouth 4 (four) times daily as needed., Disp: 118 mL, Rfl: 0   ADVAIR HFA 230-21 MCG/ACT inhaler, INHALE 2 PUFFS INTO THE LUNGS TWICE A DAY, Disp: 12 each, Rfl: 5   albuterol  (VENTOLIN  HFA) 108 (90 Base) MCG/ACT inhaler, TAKE 2 PUFFS BY MOUTH EVERY 6 HOURS AS NEEDED FOR WHEEZE OR SHORTNESS OF BREATH, Disp: 8.5 each, Rfl: 3   benzonatate  (TESSALON ) 100 MG capsule, Take 1 capsule (100 mg total) by mouth 3 (three) times daily as needed., Disp: 30 capsule, Rfl: 0   buPROPion  (WELLBUTRIN  XL) 150 MG 24 hr tablet, Take 150 mg by mouth daily., Disp: , Rfl:    cholecalciferol (VITAMIN D3) 25 MCG (1000 UNIT) tablet, Take 1,000 Units by mouth daily., Disp: , Rfl:    cyanocobalamin (VITAMIN B12) 1000 MCG tablet, Take 1,000 mcg by mouth daily., Disp: , Rfl:    diclofenac  Sodium (VOLTAREN ) 1 % GEL, Apply 2 g topically 4 (four) times daily., Disp: 150 g, Rfl: 0   estradiol  (VIVELLE -DOT) 0.0375 MG/24HR, Place 1 patch onto the skin 2 (two) times a week., Disp: 8 patch, Rfl: 12   losartan  (COZAAR ) 50 MG tablet, Take 1.5  tablets (75 mg total) by mouth daily., Disp: 135 tablet, Rfl: 1   Magnesium 250 MG TABS, Take 250 mg by mouth daily., Disp: , Rfl:    Misc. Devices MISC, Blood pressure cuff/device - 1. ICD10: I10, Disp: 1 each, Rfl: 0   omeprazole  (PRILOSEC) 40 MG capsule, Take 1 capsule (40 mg total) by mouth daily., Disp: 30 capsule, Rfl: 3   Probiotic Product (PROBIOTIC DAILY PO), Take 1 capsule by mouth daily., Disp: , Rfl:    promethazine  (PHENERGAN ) 25 MG tablet, Take 1 tablet (25 mg total) by mouth every 8 (eight) hours as needed for nausea or vomiting., Disp: 20 tablet, Rfl: 0   rosuvastatin  (CRESTOR ) 10 MG tablet, TAKE 1 TABLET BY MOUTH EVERY DAY, Disp: 90 tablet, Rfl: 1   sucralfate  (CARAFATE ) 1 g tablet, Take 1 tablet (1 g total) by mouth 4 (four) times daily -  with meals and at bedtime., Disp: 40 tablet, Rfl: 0   vitamin C (ASCORBIC ACID) 250 MG tablet, Take 250 mg by mouth daily., Disp: , Rfl:    Medications ordered in this encounter:  Meds ordered this encounter  Medications   azithromycin  (ZITHROMAX ) 250 MG tablet    Sig: Take 2 tablets on day 1, then 1 tablet daily on days 2 through 5    Dispense:  6 tablet    Refill:  0    Supervising Provider:   BLAISE ALEENE KIDD [phone]  promethazine -dextromethorphan (PROMETHAZINE -DM) 6.25-15 MG/5ML syrup    Sig: Take 5 mLs by mouth 4 (four) times daily as needed.    Dispense:  118 mL    Refill:  0    Supervising Provider:   BLAISE ALEENE KIDD [[id number]]     *If you need refills on other medications prior to your next appointment, please contact your pharmacy*  Follow-Up: Call back or seek an in-person evaluation if the symptoms worsen or if the condition fails to improve as anticipated.  [facility] [phone]  Other Instructions Upper Respiratory Infection, Adult An upper respiratory infection (URI) is a common viral infection of the nose, throat, and upper air passages that lead to the lungs. The most common type of URI is  the common cold. URIs usually get better on their own, without medical treatment. What are the causes? A URI is caused by a virus. You may catch a virus by: Breathing in droplets from an infected person's cough or sneeze. Touching something that has been exposed to the virus (is contaminated) and then touching your mouth, nose, or eyes. What increases the risk? You are more likely to get a URI if: You are very young or very old. You have close contact with others, such as at work, school, or a health care facility. You smoke. You have long-term (chronic) heart or lung disease. You have a weakened disease-fighting system (immune system). You have nasal allergies or asthma. You are experiencing a lot of stress. You have poor nutrition. What are the signs or symptoms? A URI usually involves some of the following symptoms: Runny or stuffy (congested) nose. Cough. Sneezing. Sore throat. Headache. Fatigue. Fever. Loss of appetite. Pain in your forehead, behind your eyes, and over your cheekbones (sinus pain). Muscle aches. Redness or irritation of the eyes. Pressure in the ears or face. How is this diagnosed? This condition may be diagnosed based on your medical history and symptoms, and a physical exam. Your health care provider may use a swab to take a mucus sample from your nose (nasal swab). This sample can be tested to determine what virus is causing the illness. How is this treated? URIs usually get better on their own within 7-10 days. Medicines cannot cure URIs, but your health care provider may recommend certain medicines to help relieve symptoms, such as: Over-the-counter cold medicines. Cough suppressants. Coughing is a type of defense against infection that helps to clear the respiratory system, so take these medicines only as recommended by your health care provider. Fever-reducing medicines. Follow these instructions at home: Activity Rest as needed. If you have a  fever, stay home from work or school until your fever is gone or until your health care provider says your URI cannot spread to other people (is no longer contagious). Your health care provider may have you wear a face mask to prevent your infection from spreading. Relieving symptoms Gargle with a mixture of salt and water 3-4 times a day or as needed. To make salt water, completely dissolve -1 tsp (3-6 g) of salt in 1 cup (237 mL) of warm water. Use a cool-mist humidifier to add moisture to the air. This can help you breathe more easily. Eating and drinking  Drink enough fluid to keep your urine pale yellow. Eat soups and other clear broths. General instructions  Take over-the-counter and prescription medicines only as told by your health care provider. These include cold medicines, fever reducers, and cough suppressants. Do not use any products  that contain nicotine or tobacco. These products include cigarettes, chewing tobacco, and vaping devices, such as e-cigarettes. If you need help quitting, ask your health care provider. Stay away from secondhand smoke. Stay up to date on all immunizations, including the yearly (annual) flu vaccine. Keep all follow-up visits. This is important. How to prevent the spread of infection to others URIs can be contagious. To prevent the infection from spreading: Wash your hands with soap and water for at least 20 seconds. If soap and water are not available, use hand sanitizer. Avoid touching your mouth, face, eyes, or nose. Cough or sneeze into a tissue or your sleeve or elbow instead of into your hand or into the air.  Contact a health care provider if: You are getting worse instead of better. You have a fever or chills. Your mucus is brown or red. You have yellow or brown discharge coming from your nose. You have pain in your face, especially when you bend forward. You have swollen neck glands. You have pain while swallowing. You have white areas  in the back of your throat. Get help right away if: You have shortness of breath that gets worse. You have severe or persistent: Headache. Ear pain. Sinus pain. Chest pain. You have chronic lung disease along with any of the following: Making high-pitched whistling sounds when you breathe, most often when you breathe out (wheezing). Prolonged cough (more than 14 days). Coughing up blood. A change in your usual mucus. You have a stiff neck. You have changes in your: Vision. Hearing. Thinking. Mood. These symptoms may be an emergency. Get help right away. Call 911. Do not wait to see if the symptoms will go away. Do not drive yourself to the hospital. Summary An upper respiratory infection (URI) is a common infection of the nose, throat, and upper air passages that lead to the lungs. A URI is caused by a virus. URIs usually get better on their own within 7-10 days. Medicines cannot cure URIs, but your health care provider may recommend certain medicines to help relieve symptoms. This information is not intended to replace advice given to you by your health care provider. Make sure you discuss any questions you have with your health care provider. Document Revised: 04/21/2021 Document Reviewed: 04/21/2021 Elsevier Patient Education  2024 [employer]   If you have been instructed to have an in-person evaluation today at a local Urgent Care facility, please use the link below. It will take you to a list of all of our available [facility], including address, phone number and hours of operation. Please do not delay care.  [facility]  If you or a family member do not have a primary care provider, use the link below to schedule a visit and establish care. When you choose a [facility] primary care physician or advanced practice provider, you gain a long-term partner in health. Find a Primary Care Provider  Learn more about [facility]'s in-office and  virtual care options: [facility] Get Care Now "

## 2024-10-15 ENCOUNTER — Other Ambulatory Visit: Payer: Self-pay | Admitting: Gynecologic Oncology

## 2024-10-15 DIAGNOSIS — N951 Menopausal and female climacteric states: Secondary | ICD-10-CM

## 2024-10-22 ENCOUNTER — Ambulatory Visit: Admitting: Internal Medicine

## 2024-11-04 ENCOUNTER — Telehealth: Admitting: Physician Assistant

## 2024-11-04 DIAGNOSIS — B379 Candidiasis, unspecified: Secondary | ICD-10-CM

## 2024-11-04 MED ORDER — FLUCONAZOLE 150 MG PO TABS: 150.0000 mg | ORAL_TABLET | ORAL | 0 refills | Status: AC | PRN

## 2024-11-04 NOTE — Progress Notes (Signed)

## 2024-11-19 ENCOUNTER — Ambulatory Visit: Admitting: Internal Medicine

## 2025-03-21 ENCOUNTER — Inpatient Hospital Stay: Attending: Gynecologic Oncology | Admitting: Gynecologic Oncology

## 2025-06-17 ENCOUNTER — Ambulatory Visit
